# Patient Record
Sex: Male | Born: 1945
Health system: Southern US, Community
[De-identification: ages and names within clinical notes are randomized; demographics above are authoritative.]

## PROBLEM LIST (undated history)

## (undated) DIAGNOSIS — I10 Essential (primary) hypertension: Secondary | ICD-10-CM

## (undated) DIAGNOSIS — G610 Guillain-Barre syndrome: Secondary | ICD-10-CM

## (undated) DIAGNOSIS — E1165 Type 2 diabetes mellitus with hyperglycemia: Secondary | ICD-10-CM

## (undated) DIAGNOSIS — K219 Gastro-esophageal reflux disease without esophagitis: Secondary | ICD-10-CM

## (undated) DIAGNOSIS — I4891 Unspecified atrial fibrillation: Secondary | ICD-10-CM

## (undated) DIAGNOSIS — I4892 Unspecified atrial flutter: Secondary | ICD-10-CM

## (undated) DIAGNOSIS — R230 Cyanosis: Secondary | ICD-10-CM

## (undated) HISTORY — PX: CORONARY ANGIOPLASTY: SHX604

## (undated) HISTORY — DX: Unspecified atrial fibrillation: I48.91

## (undated) HISTORY — DX: Unspecified atrial flutter: I48.92

---
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
## 2004-10-15 ENCOUNTER — Ambulatory Visit: Payer: Self-pay | Admitting: Cardiology

## 2004-11-09 ENCOUNTER — Ambulatory Visit: Payer: Self-pay | Admitting: Cardiology

## 2004-11-13 ENCOUNTER — Ambulatory Visit: Payer: Self-pay | Admitting: *Deleted

## 2004-11-16 ENCOUNTER — Ambulatory Visit: Payer: Self-pay | Admitting: *Deleted

## 2004-11-23 ENCOUNTER — Ambulatory Visit: Payer: Self-pay | Admitting: Cardiology

## 2004-11-23 ENCOUNTER — Ambulatory Visit: Payer: Self-pay | Admitting: Internal Medicine

## 2004-12-01 ENCOUNTER — Ambulatory Visit: Payer: Self-pay | Admitting: Cardiology

## 2004-12-07 ENCOUNTER — Ambulatory Visit: Payer: Self-pay | Admitting: Internal Medicine

## 2004-12-14 ENCOUNTER — Ambulatory Visit: Payer: Self-pay | Admitting: Cardiology

## 2004-12-17 ENCOUNTER — Ambulatory Visit: Payer: Self-pay | Admitting: Cardiology

## 2004-12-22 ENCOUNTER — Ambulatory Visit: Payer: Self-pay | Admitting: Cardiology

## 2004-12-23 ENCOUNTER — Ambulatory Visit: Payer: Self-pay | Admitting: Internal Medicine

## 2004-12-23 ENCOUNTER — Inpatient Hospital Stay (HOSPITAL_COMMUNITY): Admission: RE | Admit: 2004-12-23 | Discharge: 2004-12-24 | Payer: Self-pay | Admitting: Internal Medicine

## 2004-12-23 HISTORY — PX: OTHER SURGICAL HISTORY: SHX169

## 2005-02-01 ENCOUNTER — Ambulatory Visit: Payer: Self-pay | Admitting: Cardiology

## 2005-02-12 ENCOUNTER — Ambulatory Visit: Payer: Self-pay | Admitting: Cardiology

## 2005-05-07 ENCOUNTER — Ambulatory Visit: Payer: Self-pay | Admitting: Internal Medicine

## 2006-05-11 ENCOUNTER — Ambulatory Visit: Payer: Self-pay | Admitting: Internal Medicine

## 2006-06-02 ENCOUNTER — Ambulatory Visit: Payer: Self-pay | Admitting: Internal Medicine

## 2006-08-05 ENCOUNTER — Ambulatory Visit: Payer: Self-pay | Admitting: Internal Medicine

## 2007-08-01 ENCOUNTER — Ambulatory Visit: Payer: Self-pay | Admitting: Internal Medicine

## 2008-07-31 ENCOUNTER — Ambulatory Visit: Payer: Self-pay | Admitting: Internal Medicine

## 2009-07-31 DIAGNOSIS — I4891 Unspecified atrial fibrillation: Secondary | ICD-10-CM | POA: Insufficient documentation

## 2009-07-31 DIAGNOSIS — M109 Gout, unspecified: Secondary | ICD-10-CM

## 2009-07-31 DIAGNOSIS — I4892 Unspecified atrial flutter: Secondary | ICD-10-CM | POA: Insufficient documentation

## 2009-08-01 ENCOUNTER — Ambulatory Visit: Payer: Self-pay | Admitting: Internal Medicine

## 2010-08-25 NOTE — Assessment & Plan Note (Signed)
Summary: yearly/sl  Medications Added ASPIRIN 81 MG TBEC (ASPIRIN) Take one tablet by mouth daily METOPROLOL TARTRATE 50 MG TABS (METOPROLOL TARTRATE) Take 1 1/2  tablet sby mouth twice a day ALLOPURINOL 300 MG TABS (ALLOPURINOL) Take one tablet by mouth once daily. NAPROXEN 500 MG TABS (NAPROXEN) 1 tablet as needed      Allergies Added: NKDA  Visit Type:  Follow-up   History of Present Illness: Marcus [name] returns today for followup.  He is a very pleasant 65 year old male with a history of chronic atrial fibrillation which has been well controlled and for which he is asymptomatic.  He has history of atrial flutter status post ablation prior to his AFib developing.  He returns today for followup.  He continues to work on a regular basis and has no specific complaints today.  No chest pain.  No shortness of breath.  He notes that he was seen for pancreatitis in August with the etiology unknown.  It has not recurred.  No palpitations, c/p or sob.  Current Medications (verified): 1)  Aspirin 81 Mg Tbec (Aspirin) .... Take One Tablet By Mouth Daily 2)  Metoprolol Tartrate 50 Mg Tabs (Metoprolol Tartrate) .... Take 1 1/2  Tablet Sby Mouth Twice A Day 3)  Allopurinol 300 Mg Tabs (Allopurinol) .... Take One Tablet By Mouth Once Daily. 4)  Naproxen 500 Mg Tabs (Naproxen) .Marland Kitchen.. 1 Tablet As Needed  Allergies (verified): No Known Drug Allergies  Past History:  Past Medical History: Last updated: 07/31/2009 Current Problems:  ATRIAL FLUTTER (ICD-427.32) ATRIAL FIBRILLATION (ICD-427.31) GOUT (ICD-274.9)    Past Surgical History: Last updated: 07/31/2009  DATE OF PROCEDURE:  12/23/2004  PHYSICIAN:  Champ Mungo. Lovena Le, M.D.  PROCEDURE PERFORMED:  Electrophysiologic study and radiofrequency catheter  ablation of atrial flutter.  Review of Systems  The patient denies chest pain, syncope, dyspnea on exertion, and peripheral edema.    Vital Signs:  Patient profile:   65 year old  male Height:      72 inches Weight:      199 pounds BMI:     27.09 Pulse rate:   73 / minute BP sitting:   102 / 76  (left arm)  Vitals Entered By: Margaretmary Bayley CMA (August 01, 2009 8:54 AM)  Physical Exam  General:  Well developed, well nourished, in no acute distress.  HEENT: normal Neck: supple. No JVD. Carotids 2+ bilaterally no bruits Cor: IRIR no rubs, gallops or murmur Lungs: CTA Ab: soft, nontender. nondistended. No HSM. Good bowel sounds Ext: warm. no cyanosis, clubbing or edema Neuro: alert and oriented. Grossly nonfocal. affect pleasant    EKG  Procedure date:  08/01/2009  Findings:      Atrial fibrillation with a controlled ventricular response rate of: 73.  Impression & Recommendations:  Problem # 1:  ATRIAL FIBRILLATION (ICD-427.31) He remains well rate controlled on metoprolol and is asymptomatic despite maintaining an active lifestyle.  He will continue on with his current medical therapy and I will see him back in 2 years, sooner if his symptoms return. His updated medication list for this problem includes:    Aspirin 81 Mg Tbec (Aspirin) .Marland Kitchen... Take one tablet by mouth daily    Metoprolol Tartrate 50 Mg Tabs (Metoprolol tartrate) .Marland Kitchen... Take 1 1/2  tablet sby mouth twice a day  Problem # 2:  GOUT (ICD-274.9) He is currently asymptomatic.  I have directed him to look up foods that exacerbate gout on the internet.  continue Allopurinol. His  updated medication list for this problem includes:    Allopurinol 300 Mg Tabs (Allopurinol) .Marland Kitchen... Take one tablet by mouth once daily.  Patient Instructions: 1)  Your physician recommends that you schedule a follow-up appointment in: 2 years. 2)  Your physician recommends that you continue on your current medications as directed. Please refer to the Current Medication list given to you today.  Prescription for Metoprolol Tartrate sent to [facility] in [place]. Prescriptions: METOPROLOL TARTRATE 50 MG TABS (METOPROLOL  TARTRATE) Take 1 1/2  tablet sby mouth twice a day  #270 x 4   Entered by:   Merdis Delay, RN, BSN   Authorized by:   Sophronia Simas, MD, [facility]   Signed by:   Merdis Delay, RN, BSN on 08/01/2009   Method used:   Electronically to        [facility] (retail)       [address], [place]  [address]       Ph: [phone]       Fax: [phone]   RxID:   [id number]

## 2010-12-08 NOTE — Assessment & Plan Note (Signed)
[place] OFFICE NOTE   Marcus [name], Marcus [name]            MRN:          [id number]  DATE:08/01/2007                            DOB:          01/18/1946    Mr. Palardy returns today for followup.  He is a very  pleasant 65-  year-old man whom I initially met with atrial flutter.  He underwent  catheter ablation of his atrial flutter and then unfortunately developed  atrial fibrillation.  He also has hypertension, and, for this reason, he  was initially placed on Coumadin but was intolerant of this medication  and ultimately stopped it.  He has been maintained on an aspirin a day.  He also continues on metoprolol 75 mg twice daily and multiple vitamins.  He returns today for followup.  He is otherwise doing well.  He has no  chest pain.  He has no shortness of breath.  He has no peripheral edema.  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   PHYSICAL EXAMINATION:  GENERAL:  A pleasant, well-appearing man in no  distress.  VITAL SIGNS:  The blood pressure was 110/70. The pulse was 90 and  regular. Respirations were 18. The weight was 215 pounds.  NECK:  Revealed no jugular distention.  LUNGS:  Clear bilaterally to auscultation.  No wheezes, rales or rhonchi  were present.  CARDIOVASCULAR:  Irregular regular rhythm with normal S1-S2.  EXTREMITIES:  Demonstrate no edema.   IMPRESSION:  1. Atrial fibrillation.  2. Hypertension.   DISCUSSION:  Overall, Mr. Swing is stable, and his atrial  fibrillation is relatively well controlled.  He has no palpitations and  denies chest pain or shortness of breath.  Plan to see him back in a  year, and he will continue his medications including:  1. Metoprolol 75 twice daily.  2. Aspirin 81 mg a day.  3. Multiple vitamins.     Champ Mungo. Lovena Le, MD  Electronically Signed    GWT/MedQ  DD: 08/01/2007  DT: 08/01/2007  Job #: [id number]   cc:   Matthias Hughs

## 2010-12-08 NOTE — Assessment & Plan Note (Signed)
[facility] OFFICE NOTE   GURTAJ, RUZ            MRN:          [id number]  DATE:07/31/2008                            DOB:          [birth year]    Mr. Bitner returns today for followup.  He is a very pleasant 65-year-  old male with a history of chronic atrial fibrillation which has been  well controlled and for which he is asymptomatic.  He has history of  atrial flutter status post ablation prior to his AFib developing.  He  returns today for followup.  He continues to work on a regular basis and  has no specific complaints today.  No chest pain.  No shortness of  breath.  Of course, he does not smoke cigarettes.   On physical exam, he is a pleasant, well-appearing, middle-aged man in  no distress.  Blood pressure was 118/80, the pulse 78 and irregular,  respirations were 18, the weight was 210 pounds.  Neck revealed no  jugular venous distention.  Lungs clear bilaterally to auscultation.  No  wheezes, rales, or rhonchi are present.  Cardiovascular exam revealed an  irregular rate and rhythm.  Normal S1 and S2.  The abdominal exam is  soft and nontender.  Extremities demonstrate no edema.   Medicines include:  1. Aspirin 81 a day.  2. Metoprolol 50 mg one and a half tablet twice daily.   The EKG demonstrates atrial fibrillation with controlled ventricular  response.   IMPRESSION:  1. Chronic atrial fibrillation.  2. History of atrial flutter status post ablation.  3. History of gout, well controlled.   DISCUSSION:  Mr. Boening is stable and his AFib is well controlled.  I  will see him back in a year.  We will continue his current medications  with aspirin and metoprolol.     Champ Mungo. Lovena Le, MD  Electronically Signed    GWT/MedQ  DD: 07/31/2008  DT: 08/01/2008  Job #: [id number]   cc:   Matthias Hughs

## 2010-12-11 NOTE — Assessment & Plan Note (Signed)
[facility] OFFICE NOTE   Marcus, [name]            MRN:          [id number]  DATE:08/05/2006                            DOB:          14-Sep-1945    Marcus [name] returns today for a followup.  He is a very pleasant  middle-aged man with chronic atrial fibrillation, who has had difficulty  with ventricular rate control for some time.  We recently saw him in the  office and up-titrated his dose of metoprolol to 75 mg twice daily.  Prior to that, he had noted palpitations and difficulty with exertion.  He states that, with the increase in medications, his heart rate has  been under better control.  He denies syncope.  He denies palpitations.  He denies chest pain.   On exam, he is a pleasant, well-appearing, middle-aged man in no  distress.  The blood pressure was 108/66, pulse was 84 and irregular,  the respirations 18, the weight was 209 pounds.  NECK:  Revealed no jugular venous distention.  There is no thyromegaly.  LUNGS:  Clear bilaterally to auscultation.  CARDIOVASCULAR EXAM:  Revealed an irregularly irregular rhythm with  normal S1 and S2.  EXTREMITIES:  Demonstrated no edema.   His EKG demonstrates atrial fibrillation with a ventricular rate of 84  beats per minute.   Today, we walked him around our office for several minutes and noted  that his peak heart rate only got up to 115 beats per minute.   IMPRESSION:  1. Chronic atrial fibrillation.  2. Hypertension.  3. History of atrial flutter, status post ablation.   DISCUSSION:  Overall, Marcus [name] is stable.  His atrial fibrillation  appears to be well-controlled.  He will continue on 75 mg twice daily  and continue on aspirin for his A-fib thromboembolic stroke prevention,  as he has not been felt to be a good long-term Coumadin candidate by his  primary physicians and because he has well-controlled hypertension and  age  of 32.  We will plan to see the patient back in one year, sooner  should he have additional problems.     Champ Mungo. Lovena Le, MD  Electronically Signed    GWT/MedQ  DD: 08/05/2006  DT: 08/05/2006  Job #: [id number]   cc:   Matthias Hughs

## 2010-12-11 NOTE — Procedures (Signed)
[facility]                                EXERCISE TREADMILL   NAME:[name], Marcus TANZI            MRN:          [id number]  DATE:06/02/2006                            DOB:          [birth year]    EXERCISE TREADMILL REPORT:   PROCEDURE PERFORMED:  Exercise treadmill testing.   INDICATIONS:  Evaluation of chronotropic response in a patient in atrial  fibrillation on beta blocker therapy.   INTRODUCTION:  The patient is a very pleasant middle-aged man with a history  of atrial flutter who underwent catheter ablation back in May, 2006 but then  subsequently developed atrial fibrillation with a fairly rapid ventricular  response.  The patient has been treated with beta blockers since then.  Each  time he comes back to the office, he notes some palpitations and had a  documented EKG with heart rates that are typically increased between 90 and  110 beats per minute.  Because of concerns of development of tachycardia-  induced cardiomyopathy and because of symptoms of shortness of breath with  exertion, he is now referred for exercise treadmill test to evaluate his  chronotropic response to exercise as well as his blood pressure response to  exercise in the setting of atrial fibrillation.   PROCEDURE:  After informed consent was obtained, the patient was prepped in  the usual manner and placed on the exercise treadmill.  He subsequently  walked for 9 minutes on a Bruce protocol.  His blood pressure initially was  160/70, and his heart rate was between 110-115.  Immediately into exercise,  the heart rate increased to 130 beats per minute, and his blood pressure  remains stable.  At the end of stage III of exercise, his blood pressure was  179/86 with a somewhat blunted response to exercise with baseline  hypertension to start, and his heart rate increased to a maximum of 184  beats per minute.  Despite this, he denied chest pain or shortness of  breath  and denied claudication or other symptoms.  Because of his increased heart  rate at 9 minutes, the test was discontinued, and the patient was allowed to  sit down.  Heart rate and blood pressure returned back to normal.  At 6  minutes, his ventricular response and A fib was 100 beats per minute, and  his blood pressure was back down to 130/99.   Review of the patient's electrocardiogram demonstrated the electrical  portion of the test to be notable for approximately 1 mm of horizontal ST  segment depression, which resolved within 2 minutes of recovery.  The  findings demonstrated were subsequently deemed to be clinically negative,  and electrically nondiagnostic in the setting of his underlying abnormal  baseline ECG.  The patient did walk a total of 10.4 mets.   IMPRESSION:  Atrial fibrillation with a rapid ventricular response.   DISCUSSION:  After discussing the treatment options with [name], I  have recommended that we go up on his metoprolol from 50 mg twice daily to  75 mg twice daily with plans to increase his metoprolol further, should that  be required.  Alternative  plans, if the patient  becomes fatigued on up-titration of metoprolol, is that we will try digoxin  in conjunction with metoprolol.  I will plan to see the patient back in  several months.    ______________________________  Champ Mungo. Lovena Le, MD    GWT/MedQ  DD: 06/02/2006  DT: 06/02/2006  Job #: [id number]   cc:   Alfredo Martinez

## 2010-12-11 NOTE — Assessment & Plan Note (Signed)
[facility]                           ELECTROPHYSIOLOGY OFFICE NOTE   AMOGH, KOMATSU            MRN:          [id number]  DATE:05/11/2006                            DOB:          [birth year]    Marcus [name] returns today for followup of his atrial fibrillation.  He is  a very pleasant middle-age man with hypertension and atrial flutter, who  underwent initial catheter ablation back in May of 2006.  At that time he  had typical counterclockwise tricuspid annular reentrant atrial flutter and  had successful ablation.  Unfortunately, the patient subsequently developed  spontaneous recurrent atrial fibrillation.  He had initially been on  Coumadin, but was felt not to be a good Coumadin candidate by his primary  cardiologist and has been on aspirin since then.  In retrospect, because he  only has very mild  hypertension and age less than 76, aspirin therapy along  is reasonably normal in the setting of normal LV systolic function.  In  addition, he has no diabetes.  His main complaint today is that when he does  any significant exertional activities, he gets short of breath and tired and  develops bilateral leg discomfort and weakness and has to stop what he is  doing.  Typically after resting for a while, he can continue his activities.  There is no associated chest pain.  There is dyspnea.  He denies  palpitations and has no awareness of his heart rhythm.   PHYSICAL EXAMINATION:  He is a pleasant middle-age man in no acute distress.  The blood pressure is 114/80, the pulse 78 and irregular, respirations were  18.  The weight was 209 pounds.  NECK:  Revealed no jugular venous distention and there is no thyromegaly.  The trachea is midline.  The carotids are 2+ and symmetric.  LUNGS:  Clear bilaterally to auscultation.  There are no wheezes, rales or  rhonchi, no increased work of breathing.  CARDIOVASCULAR:  Exam revealed an  irregularly irregular rhythm with normal  S1 and S2.  The PMI was not enlarged, nor was it laterally displaced.  ABDOMEN:  Soft, nontender and non-distended.  There is no organomegaly.  The  bowel sounds are present.  There is no rebound or guarding.  EXTREMITIES:  No cyanosis, clubbing or edema.  The pulses were 2+ and  symmetric.  NEUROLOGIC:  Alert and oriented x3 with cranial nerves intact.  The strength  was 5/5 and symmetric.   EKG:  Demonstrates atrial fibrillation with a controlled ventricular rate at  92 beats per minute.   IMPRESSION:  1. Atrial fibrillation.  2. Dyspnea of unclear etiology, but concerning for either tachycardia-      induced cardiomyopathy or rapid ventricular response resulting in      worsening dyspnea.  3. Atrial flutter, status post catheter ablation.  4. Hypertension.   DISCUSSION:  I have discussed the treatment options with Marcus [name] in  detail.  I am concerned that he is developing a rapid ventricular rate when  he exerts himself.  For this reason, I have asked that he come in for an  exercise treadmill test on  medication, specifically on his metoprolol, to  see if his ventricular rate is well controlled.  If it is well controlled,  then looking for alternative causes of his dyspnea and fatigue would be  warranted.  Otherwise, if he develops a rapid ventricular rate with  exertion, up-titration of his beta blocker and adding digoxin would be my  next choice for therapy.            ______________________________  Champ Mungo. Lovena Le, MD     GWT/MedQ  DD:  05/11/2006  DT:  05/12/2006  Job #:  [id number]   cc:   Ernestine Mcmurray, MD,FACC

## 2010-12-11 NOTE — Discharge Summary (Signed)
NAME:  Marcus [name], BLOOR NO.:  [id number]   MEDICAL RECORD NO.:  [id number]          PATIENT TYPE:  OIB   LOCATION:  3705                         FACILITY:  [facility]   PHYSICIAN:  Champ Mungo. Lovena Le, M.D.  DATE OF BIRTH:  07-08-1946   DATE OF ADMISSION:  12/23/2004  DATE OF DISCHARGE:  12/24/2004                                 DISCHARGE SUMMARY   DISCHARGE DIAGNOSES:  1.  Discharging day #1 status post radiofrequency catheter ablation of      typical atrial flutter with creation of bidirectional isthmus block, Dr.      Cristopher Peru.  2.  Atrial flutter diagnosed prior to procedure for kidney stone retrieval.  3.  Hypertension.  4.  Coumadin therapy, chronic.  5.  Left ventricular hypertrophy, low normal ejection fraction.   PROCEDURE:  On Dec 23, 2004, radiofrequency catheter ablation of typical  atrial flutter by Dr. Lovena Le with termination of flutter and restoration of  sinus rhythm, creation of bidirectional isthmus block.   DISCHARGE DISPOSITION:  [name] is ready for discharge post procedure  day #1.  He is in sinus rhythm with frequent premature atrial complexes.  He  is on metoprolol 50 mg twice daily and will stay on metoprolol twice daily.  He has also been started on enteric-coated aspirin 325 mg for the next six  weeks.  His PT at the time of discharge is 20.5 and INR 2.3.  He will  continue his Coumadin 5 mg daily.  He is to avoid driving for the next two  days.  He may shower.  He is to call [phone] if he experiences pain or  swelling at the catheterization site of the right groin.  He is to avoid  heavy lifting for the next two weeks.  He will see Dr. Lovena Le in followup  Monday, July 10 at 9:45 in the morning and if he has dental work even teeth  cleaning up to and including November 2006, he is to call [phone] for  antibiotic coverage.   BRIEF HISTORY:  [name] is a 65 year old male referred by Dr.  Dannielle Burn for evaluation of atrial  flutter.  This was diagnosed on a routine  screening examination prior to cystoscopy.  He does have some symptoms which  include fatigue and little energy, which have been present for some time,  perhaps several years.  He rarely feels palpitations.  After seeing Dr.  Dannielle Burn, he was started on Coumadin and beta-blocker and referred for  consideration of catheter ablation.  His INRs have been borderline  therapeutic and evaluation has demonstrated very low normal left ventricular  function.  There is no pericardial effusion. The patient has never had  syncope.  The patient has left ventricular hypertrophy.  The patient will  present after considering risks and benefits of the procedure for an  elective procedure May 31.   HOSPITAL COURSE:  The patient presented Dec 23, 2004 and underwent  radiofrequency catheter ablation of his atrial flutter the same day.  He has  maintained sinus rhythm with frequent PACs in a period after the ablation.  Discharging post procedure day #1 with the medications and followup as  dictated.      GM/MEDQ  D:  12/24/2004  T:  12/24/2004  Job:  [id number]   cc:   Matthias Hughs  [address]  [place]  [place] [address]  Fax: [phone]

## 2010-12-11 NOTE — Op Note (Signed)
NAME:  Marcus [name], Marcus [name] NO.:  [id number]   MEDICAL RECORD NO.:  [id number]          PATIENT TYPE:  OIB   LOCATION:  NA                           FACILITY:  [facility]   PHYSICIAN:  Champ Mungo. Lovena Le, M.D.  DATE OF BIRTH:  25-May-1946   DATE OF PROCEDURE:  12/23/2004  DATE OF DISCHARGE:                                 OPERATIVE REPORT   PROCEDURE PERFORMED:  Electrophysiologic study and radiofrequency catheter  ablation of atrial flutter.   ATTENDING:  Champ Mungo. Lovena Le, M.D.   I. INTRODUCTION:  The patient is a 65 year old man with a history of atrial  flutter diagnosed several months ago.  He has been on systemic  anticoagulation with Coumadin.  He has been treated with beta blockers, but  despite this, has had recurrent persistent atrial flutter.  He is now  referred for electrophysiologic study and catheter ablation.   II. PROCEDURE:  After informed consent was obtained, the patient was taken  to the diagnostic EP lab in a fasting state.  After the usual preparation  and draping, intravenous fentanyl and midazolam were given for sedation.  A  6-French Hexapolar catheter was inserted percutaneously into the right  jugular vein and advanced to the coronary sinus.  A 5-French quadripolar  catheter was inserted percutaneously in the right femoral vein and advanced  to the His bundle region.  A 7-French 20-pole Halo catheter was inserted  percutaneously into the right femoral vein and advanced to the right atrium.  Mapping was then carried out.  This demonstrated typical counterclockwise  tricuspid annular reentrant atrial flutter at a cycle length of 200  milliseconds.  The ablation catheter was then inserted percutaneously  through the right femoral vein and advanced into the right atrium.  Mapping  was carried out with the ablation catheter.  The atrial flutter isthmus was  large and was very muscular, based on the large amplitude of the atrial  activation signals in the  atrial flutter isthmus.  Five RF energy  applications were delivered to the atrial flutter isthmus, resulting in  termination of atrial flutter and restoration of sinus rhythm.  Pacing was  then carried out, demonstrating intact isthmus conduction.  Seven additional  RF energy applications were delivered.  It should be noted that during these  RF energy applications, an isthmus block would intermittently occur, but  then go away.  Finally, during the seventh additional RF energy application,  isthmus block was persistent.  Two additional bonus RF energy applications  were delivered.  At this point, electrophysiologic testing was carried out.   Rapid atrial pacing was carried out from the coronary sinus as well as the  right atrium at pacing cycle lengths of 600 milliseconds and stepwise-  decreased down to 290 milliseconds, where A-V Wenckebach was observed.  During rapid atrial pacing, there is no inducible SVT and there was no  evidence of any dual A-V nodal physiology.  Next, programmed atrial  stimulation was carried out from the coronary sinus as well as the high  right atrium at basic drive cycle lengths of 500 milliseconds.  The S1-S2  interval was stepwise decreased from 440 milliseconds down to 260  milliseconds, where the A-V node ERP was observed.  During programmed  stimulation, there were no A-H jumps and no echo beats, and non-inducible  SVT.  Next, rapid ventricular pacing was carried out the RV apex at basic  drive cycle length of 600 milliseconds and stepwise decreased down to 560  milliseconds, where V-A Wenckebach was observed.  During rapid ventricular  pacing, the atrial activation was midline and decremental.  There were no  inducible arrhythmias.  At this point, the patient had his isthmus  conduction assessed and there was still no longer any isthmus conduction now  over 30 minutes after ablation, the catheters were removed, hemostasis was  assured, and the patient  was returned to his room in satisfactory condition.   III. COMPLICATIONS:  There were no immediate procedural complications.   IV. RESULTS:  A.  Baseline ECG:  The baseline ECG demonstrates atrial  flutter with a cycle length of 200 milliseconds and variable A-V conduction.  B.  Baseline intervals:  The H-V interval was 50 milliseconds.  The QRS  duration was 87 milliseconds.  C.  Rapid ventricular pacing:  Rapid ventricular pacing following catheter  ablation of atrial flutter demonstrated V-A Wenckebach cycle length of 560  milliseconds.  During rapid ventricular pacing, the atrial activation  sequence was midline decremental.  D.  Programmed ventricular stimulation:  Programmed ventricular stimulation  was carried out from the RV apex at a basic drive cycle length of 600  milliseconds.  The S1-S2 interval was stepwise decreased down to 500  milliseconds where a retrograde A-V node ERP was observed.  During  programmed ventricular stimulation, the atrial activation was midline and  decremental.  E.  Rapid atrial pacing:  Rapid atrial pacing was carried out from the  coronary sinus as well as the right atrium at pacing cycle length of 500  milliseconds and stepwise decreased down to 290 milliseconds, demonstrating  A-V Wenckebach.  During rapid atrial pacing, the P-R interval was less than  the R-R interval and there was no inducible SVT.  F.  Programmed atrial stimulation:  Programmed atrial stimulation was  carried out from the coronary sinus as well as the high right atrium at  basic drive cycle length of 500 milliseconds.  The S1-S2 interval was  stepwise decreased from 440 milliseconds down to 260 milliseconds where the  A-V node ERP was observed.  During programmed atrial stimulation, there were  no A-H jumps and no echo beats, and no inducible SVT.  G.  Arrhythmias observed: 1. Atrial flutter:  Initiation present time of EP study.  Duration was      sustained.  Termination was  with catheter ablation.      a. Mapping:  Mapping of atrial flutter isthmus demonstrated typical         orientation.  The size was much larger than usual and the thickness         was much increased, based on the amplitude of the flutter waves.          I. RF energy application:  A total of 14 RF energy applications              were delivered.  During the fifth RF energy application, atrial              flutter was terminated and sinus rhythm restored.  Seven  additional RF energy applications were delivered resulting in              isthmus block.  Finally, 2 bonus RF energy applications were              delivered and the patient was observed for approximately 30              minutes, and no recurrent atrial flutter isthmus conduction.     V. CONCLUSION:  This study demonstrates successful electrophysiologic study  and radiofrequency catheter ablation of typical atrial flutter with a total  of 14 radiofrequency energy applications delivered to an enlarged and  thickened atrial flutter isthmus, resulting in termination of atrial  fibrillation, restoration of sinus rhythm, and creation of bidirectional  block in the atrial flutter isthmus.      GWT/MEDQ  D:  12/23/2004  T:  12/23/2004  Job:  [id number]   cc:   Rhona Leavens  [place]  [place]  [place] [address]  Fax: [place]  [place]  [place] [address]  Fax: [phone]

## 2011-08-04 ENCOUNTER — Encounter: Payer: Self-pay | Admitting: Internal Medicine

## 2011-08-04 ENCOUNTER — Ambulatory Visit (INDEPENDENT_AMBULATORY_CARE_PROVIDER_SITE_OTHER): Payer: Medicare Other | Admitting: Internal Medicine

## 2011-08-04 VITALS — BP 114/88 | HR 78 | Ht 72.0 in | Wt 193.0 lb

## 2011-08-04 DIAGNOSIS — I4891 Unspecified atrial fibrillation: Secondary | ICD-10-CM

## 2011-08-04 MED ORDER — DABIGATRAN ETEXILATE MESYLATE 150 MG PO CAPS: 150.0000 mg | ORAL_CAPSULE | Freq: Two times a day (BID) | ORAL | Status: DC

## 2011-08-04 NOTE — Patient Instructions (Signed)
Your physician wants you to follow-up in: 12 months with Dr Knox Saliva will receive a reminder letter in the mail two months in advance. If you don't receive a letter, please call our office to schedule the follow-up appointment.   Your physician has recommended you make the following change in your medication:  1) Start Pradaxa 15m twice daily

## 2011-08-04 NOTE — Assessment & Plan Note (Signed)
The patient's symptoms are well-controlled. I discussed the importance of anticoagulation as well as the risk and benefits to the patient. I have recommended initiation of Pradaxa. I will plan to see the patient back in several months.

## 2011-08-04 NOTE — Progress Notes (Signed)
HPI [name] returns today for followup. He is a very pleasant 66 year old man with chronic atrial fibrillation and well-controlled ventricular response. The patient has hypertension. He also has diabetes. The patient has had no syncope, chest pain, or shortness of breath. He denies weakness or vision changes. No Known Allergies   Current Outpatient Prescriptions  Medication Sig Dispense Refill  . allopurinol (ZYLOPRIM) 300 MG tablet Take 300 mg by mouth daily.      Marland Kitchen aspirin 81 MG tablet Take 160 mg by mouth daily.      . metFORMIN (GLUCOPHAGE) 500 MG tablet Take 500 mg by mouth 2 (two) times daily with a meal.      . metoprolol (LOPRESSOR) 50 MG tablet Take 75 mg by mouth 2 (two) times daily.      . naproxen (NAPROSYN) 500 MG tablet Take 500 mg by mouth 2 (two) times daily with a meal.      . dabigatran (PRADAXA) 150 MG CAPS Take 1 capsule (150 mg total) by mouth every 12 (twelve) hours.  60 capsule  6     Past Medical History  Diagnosis Date  . Atrial flutter   . AF (atrial fibrillation)   . Gout     ROS:   All systems reviewed and negative except as noted in the HPI.   Past Surgical History  Procedure Date  . Catheter ablation 12/23/2004     No family history on file.   History   Social History  . Marital Status: Married    Spouse Name: N/A    Number of Children: N/A  . Years of Education: N/A   Occupational History  . Not on file.   Social History Main Topics  . Smoking status: Never Smoker   . Smokeless tobacco: Not on file  . Alcohol Use: Not on file  . Drug Use: Not on file  . Sexually Active: Not on file   Other Topics Concern  . Not on file   Social History Narrative  . No narrative on file     BP 114/88  Pulse 78  Ht 6' (1.829 m)  Wt 87.544 kg (193 lb)  BMI 26.18 kg/m2  Physical Exam:  Well appearing middle-age man, NAD HEENT: Unremarkable Neck:  No JVD, no thyromegally Lungs:  Clear with no wheezes, rales, or rhonchi. HEART:   IRegular rate rhythm, no murmurs, no rubs, no clicks Abd:  soft, positive bowel sounds, no organomegally, no rebound, no guarding Ext:  2 plus pulses, no edema, no cyanosis, no clubbing Skin:  No rashes no nodules Neuro:  CN II through XII intact, motor grossly intact  EKG Atrial fibrillation with a controlled ventricular response  Assess/Plan:

## 2011-10-21 ENCOUNTER — Other Ambulatory Visit: Payer: Self-pay | Admitting: Internal Medicine

## 2011-10-25 ENCOUNTER — Other Ambulatory Visit: Payer: Self-pay

## 2011-10-25 MED ORDER — METOPROLOL TARTRATE 50 MG PO TABS: 75.0000 mg | ORAL_TABLET | Freq: Two times a day (BID) | ORAL | Status: DC

## 2012-01-20 ENCOUNTER — Other Ambulatory Visit: Payer: Self-pay | Admitting: Internal Medicine

## 2012-08-08 ENCOUNTER — Ambulatory Visit (INDEPENDENT_AMBULATORY_CARE_PROVIDER_SITE_OTHER): Payer: Medicare Other | Admitting: Internal Medicine

## 2012-08-08 ENCOUNTER — Encounter: Payer: Self-pay | Admitting: Internal Medicine

## 2012-08-08 VITALS — BP 136/85 | HR 69 | Ht 73.0 in | Wt 192.8 lb

## 2012-08-08 DIAGNOSIS — I4891 Unspecified atrial fibrillation: Secondary | ICD-10-CM

## 2012-08-08 NOTE — Progress Notes (Signed)
HPI Marcus [name] returns today for followup. He is a very pleasant 67 year old man with hypertension and chronic atrial fibrillation. In the interim he has done well. He denies chest pain, shortness of breath, or peripheral edema. No Known Allergies   Current Outpatient Prescriptions  Medication Sig Dispense Refill  . allopurinol (ZYLOPRIM) 300 MG tablet Take 300 mg by mouth daily.      Marland Kitchen aspirin 81 MG tablet Take 160 mg by mouth daily.      Marland Kitchen LOPRESSOR 50 MG tablet TAKE 1&1/2 TABLET 2 TIMES A DAY.  90 each  6     Past Medical History  Diagnosis Date  . Atrial flutter   . AF (atrial fibrillation)   . Gout     ROS:   All systems reviewed and negative except as noted in the HPI.   Past Surgical History  Procedure Date  . Catheter ablation 12/23/2004     No family history on file.   History   Social History  . Marital Status: Married    Spouse Name: N/A    Number of Children: N/A  . Years of Education: N/A   Occupational History  . Not on file.   Social History Main Topics  . Smoking status: Never Smoker   . Smokeless tobacco: Not on file  . Alcohol Use: Not on file  . Drug Use: Not on file  . Sexually Active: Not on file   Other Topics Concern  . Not on file   Social History Narrative  . No narrative on file     BP 136/85  Pulse 69  Ht 6' 1"  (1.854 m)  Wt 192 lb 12.8 oz (87.454 kg)  BMI 25.44 kg/m2  Physical Exam:  Well appearing 67 year old man, NAD HEENT: Unremarkable Neck:  No JVD, no thyromegally Lungs:  Clear with no wheezes, rales, or rhonchi. HEART:  IRegular rate rhythm, no murmurs, no rubs, no clicks Abd:  soft, positive bowel sounds, no organomegally, no rebound, no guarding Ext:  2 plus pulses, no edema, no cyanosis, no clubbing Skin:  No rashes no nodules Neuro:  CN II through XII intact, motor grossly intact  DEVICE  Normal device function.  See PaceArt for details.   Assess/Plan:

## 2012-08-08 NOTE — Assessment & Plan Note (Signed)
The patient is asymptomatic and his ventricular rate appears to be well-controlled. He'll continue with his beta blocker therapy. His risk of stroke is low. I plan to see him back in 24 months, sooner if he has problems with his atrial fibrillation.

## 2012-08-08 NOTE — Patient Instructions (Signed)
Your physician wants you to follow-up in: 2 years with Dr. Lovena Le.  You will receive a reminder letter in the mail two months in advance. If you don't receive a letter, please call our office to schedule the follow-up appointment.

## 2012-08-16 ENCOUNTER — Other Ambulatory Visit: Payer: Self-pay | Admitting: Internal Medicine

## 2012-10-16 ENCOUNTER — Other Ambulatory Visit: Payer: Self-pay | Admitting: Internal Medicine

## 2013-06-25 ENCOUNTER — Inpatient Hospital Stay (HOSPITAL_COMMUNITY): Payer: Medicare Other

## 2013-06-25 ENCOUNTER — Encounter (HOSPITAL_COMMUNITY): Payer: Medicare Other | Admitting: Anesthesiology

## 2013-06-25 ENCOUNTER — Inpatient Hospital Stay (HOSPITAL_COMMUNITY): Payer: Medicare Other | Admitting: Anesthesiology

## 2013-06-25 ENCOUNTER — Encounter (HOSPITAL_COMMUNITY)
Admission: EM | Disposition: A | Discharge status: Home Health | Payer: Self-pay | Source: Home / Self Care | Attending: Orthopaedic Surgery

## 2013-06-25 ENCOUNTER — Emergency Department (HOSPITAL_COMMUNITY): Payer: Medicare Other

## 2013-06-25 ENCOUNTER — Inpatient Hospital Stay (HOSPITAL_COMMUNITY)
Admission: EM | Admit: 2013-06-25 | Discharge: 2013-06-30 | DRG: 907 | Disposition: A | Discharge status: Home Health | Payer: Medicare Other | Attending: Orthopaedic Surgery | Admitting: Orthopaedic Surgery

## 2013-06-25 ENCOUNTER — Encounter (HOSPITAL_COMMUNITY): Payer: Self-pay | Admitting: Emergency Medicine

## 2013-06-25 DIAGNOSIS — S7710XA Crushing injury of unspecified thigh, initial encounter: Principal | ICD-10-CM | POA: Diagnosis present

## 2013-06-25 DIAGNOSIS — M109 Gout, unspecified: Secondary | ICD-10-CM | POA: Diagnosis present

## 2013-06-25 DIAGNOSIS — Z23 Encounter for immunization: Secondary | ICD-10-CM

## 2013-06-25 DIAGNOSIS — S7291XA Unspecified fracture of right femur, initial encounter for closed fracture: Secondary | ICD-10-CM

## 2013-06-25 DIAGNOSIS — I4891 Unspecified atrial fibrillation: Secondary | ICD-10-CM | POA: Diagnosis present

## 2013-06-25 DIAGNOSIS — E119 Type 2 diabetes mellitus without complications: Secondary | ICD-10-CM | POA: Diagnosis present

## 2013-06-25 DIAGNOSIS — S72309A Unspecified fracture of shaft of unspecified femur, initial encounter for closed fracture: Secondary | ICD-10-CM | POA: Diagnosis present

## 2013-06-25 DIAGNOSIS — IMO0002 Reserved for concepts with insufficient information to code with codable children: Secondary | ICD-10-CM | POA: Diagnosis present

## 2013-06-25 DIAGNOSIS — I10 Essential (primary) hypertension: Secondary | ICD-10-CM | POA: Diagnosis present

## 2013-06-25 DIAGNOSIS — Y93H9 Activity, other involving exterior property and land maintenance, building and construction: Secondary | ICD-10-CM

## 2013-06-25 DIAGNOSIS — W3183XA Contact with special construction vehicle in stationary use, initial encounter: Secondary | ICD-10-CM

## 2013-06-25 DIAGNOSIS — S7290XA Unspecified fracture of unspecified femur, initial encounter for closed fracture: Secondary | ICD-10-CM

## 2013-06-25 DIAGNOSIS — D62 Acute posthemorrhagic anemia: Secondary | ICD-10-CM

## 2013-06-25 HISTORY — PX: FEMUR IM NAIL: SHX1597

## 2013-06-25 LAB — SURGICAL PCR SCREEN
MRSA, PCR: NEGATIVE
Staphylococcus aureus: NEGATIVE

## 2013-06-25 LAB — CBC WITH DIFFERENTIAL/PLATELET
Basophils Relative: 0 % (ref 0–1)
Hemoglobin: 16.4 g/dL (ref 13.0–17.0)
Lymphocytes Relative: 15 % (ref 12–46)
MCHC: 33.4 g/dL (ref 30.0–36.0)
Monocytes Relative: 6 % (ref 3–12)
Neutro Abs: 7.5 10*3/uL (ref 1.7–7.7)
Neutrophils Relative %: 79 % — ABNORMAL HIGH (ref 43–77)
RBC: 5.2 MIL/uL (ref 4.22–5.81)
WBC: 9.5 10*3/uL (ref 4.0–10.5)

## 2013-06-25 LAB — BASIC METABOLIC PANEL
BUN: 26 mg/dL — ABNORMAL HIGH (ref 6–23)
CO2: 24 mEq/L (ref 19–32)
Chloride: 102 mEq/L (ref 96–112)
Creatinine, Ser: 1.16 mg/dL (ref 0.50–1.35)
GFR calc Af Amer: 72 mL/min — ABNORMAL LOW (ref >90)
Glucose, Bld: 294 mg/dL — ABNORMAL HIGH (ref 70–99)

## 2013-06-25 LAB — TYPE AND SCREEN: Antibody Screen: NEGATIVE

## 2013-06-25 LAB — ABO/RH: ABO/RH(D): O POS

## 2013-06-25 LAB — GLUCOSE, CAPILLARY
Glucose-Capillary: 173 mg/dL — ABNORMAL HIGH (ref 70–99)
Glucose-Capillary: 98 mg/dL (ref 70–99)

## 2013-06-25 SURGERY — INSERTION, INTRAMEDULLARY ROD, FEMUR, RETROGRADE
Anesthesia: General | Laterality: Right | Wound class: Clean

## 2013-06-25 MED ORDER — METHOCARBAMOL 500 MG PO TABS
500.0000 mg | ORAL_TABLET | Freq: Four times a day (QID) | ORAL | Status: DC | PRN
  Administered 2013-06-30: 500 mg via ORAL
  Filled 2013-06-25: qty 1

## 2013-06-25 MED ORDER — KETOROLAC TROMETHAMINE 30 MG/ML IJ SOLN: 30.0000 mg | Freq: Four times a day (QID) | INTRAMUSCULAR | Status: DC | PRN

## 2013-06-25 MED ORDER — PROMETHAZINE HCL 25 MG/ML IJ SOLN: 6.2500 mg | INTRAMUSCULAR | Status: DC | PRN

## 2013-06-25 MED ORDER — GLYCOPYRROLATE 0.2 MG/ML IJ SOLN
INTRAMUSCULAR | Status: DC | PRN
  Administered 2013-06-25: 0.6 mg via INTRAVENOUS

## 2013-06-25 MED ORDER — MIDAZOLAM HCL 5 MG/5ML IJ SOLN
INTRAMUSCULAR | Status: DC | PRN
  Administered 2013-06-25: 2 mg via INTRAVENOUS

## 2013-06-25 MED ORDER — ONDANSETRON HCL 4 MG/2ML IJ SOLN: 4.0000 mg | Freq: Four times a day (QID) | INTRAMUSCULAR | Status: DC | PRN

## 2013-06-25 MED ORDER — FENTANYL CITRATE 0.05 MG/ML IJ SOLN
50.0000 ug | Freq: Once | INTRAMUSCULAR | Status: AC
  Administered 2013-06-25: 50 ug via INTRAVENOUS
  Filled 2013-06-25: qty 2

## 2013-06-25 MED ORDER — POLYETHYLENE GLYCOL 3350 17 G PO PACK
17.0000 g | PACK | Freq: Every day | ORAL | Status: DC | PRN
  Administered 2013-06-28 – 2013-06-29 (×2): 17 g via ORAL
  Filled 2013-06-25 (×2): qty 1

## 2013-06-25 MED ORDER — BISACODYL 10 MG RE SUPP: 10.0000 mg | Freq: Every day | RECTAL | Status: DC | PRN

## 2013-06-25 MED ORDER — METOPROLOL TARTRATE 50 MG PO TABS
ORAL_TABLET | ORAL | Status: AC
  Administered 2013-06-25: 50 mg
  Filled 2013-06-25: qty 1

## 2013-06-25 MED ORDER — HYDROMORPHONE HCL PF 1 MG/ML IJ SOLN: 0.5000 mg | INTRAMUSCULAR | Status: DC | PRN

## 2013-06-25 MED ORDER — ONDANSETRON HCL 4 MG/2ML IJ SOLN
INTRAMUSCULAR | Status: DC | PRN
  Administered 2013-06-25: 4 mg via INTRAVENOUS

## 2013-06-25 MED ORDER — METOPROLOL TARTRATE 50 MG PO TABS
ORAL_TABLET | ORAL | Status: AC
  Administered 2013-06-25: 25 mg
  Filled 2013-06-25: qty 1

## 2013-06-25 MED ORDER — MEPERIDINE HCL 25 MG/ML IJ SOLN: 6.2500 mg | INTRAMUSCULAR | Status: DC | PRN

## 2013-06-25 MED ORDER — METOPROLOL TARTRATE 50 MG PO TABS
75.0000 mg | ORAL_TABLET | Freq: Two times a day (BID) | ORAL | Status: DC
  Administered 2013-06-26 – 2013-06-30 (×9): 75 mg via ORAL
  Filled 2013-06-25 (×11): qty 1

## 2013-06-25 MED ORDER — PROPOFOL 10 MG/ML IV BOLUS
INTRAVENOUS | Status: DC | PRN
  Administered 2013-06-25: 160 mg via INTRAVENOUS

## 2013-06-25 MED ORDER — INFLUENZA VAC SPLIT QUAD 0.5 ML IM SUSP
0.5000 mL | INTRAMUSCULAR | Status: AC
  Administered 2013-06-26: 0.5 mL via INTRAMUSCULAR
  Filled 2013-06-25: qty 0.5

## 2013-06-25 MED ORDER — TETANUS-DIPHTH-ACELL PERTUSSIS 5-2.5-18.5 LF-MCG/0.5 IM SUSP
0.5000 mL | Freq: Once | INTRAMUSCULAR | Status: AC
  Administered 2013-06-25: 0.5 mL via INTRAMUSCULAR
  Filled 2013-06-25: qty 0.5

## 2013-06-25 MED ORDER — BUPIVACAINE HCL 0.25 % IJ SOLN
INTRAMUSCULAR | Status: DC | PRN
  Administered 2013-06-25: 10 mL

## 2013-06-25 MED ORDER — INSULIN ASPART 100 UNIT/ML ~~LOC~~ SOLN: 5.0000 [IU] | Freq: Once | SUBCUTANEOUS | Status: DC

## 2013-06-25 MED ORDER — LACTATED RINGERS IV SOLN
INTRAVENOUS | Status: DC
  Administered 2013-06-25: 18:00:00 via INTRAVENOUS

## 2013-06-25 MED ORDER — LACTATED RINGERS IV SOLN: INTRAVENOUS | Status: DC

## 2013-06-25 MED ORDER — MORPHINE SULFATE 2 MG/ML IJ SOLN: 2.0000 mg | INTRAMUSCULAR | Status: DC | PRN

## 2013-06-25 MED ORDER — FLEET ENEMA 7-19 GM/118ML RE ENEM: 1.0000 | ENEMA | Freq: Once | RECTAL | Status: AC | PRN

## 2013-06-25 MED ORDER — SUCCINYLCHOLINE CHLORIDE 20 MG/ML IJ SOLN
INTRAMUSCULAR | Status: DC | PRN
  Administered 2013-06-25: 120 mg via INTRAVENOUS

## 2013-06-25 MED ORDER — DOCUSATE SODIUM 100 MG PO CAPS
100.0000 mg | ORAL_CAPSULE | Freq: Two times a day (BID) | ORAL | Status: DC
  Administered 2013-06-25 – 2013-06-30 (×10): 100 mg via ORAL
  Filled 2013-06-25 (×12): qty 1

## 2013-06-25 MED ORDER — SODIUM CHLORIDE 0.9 % IV BOLUS (SEPSIS)
500.0000 mL | Freq: Once | INTRAVENOUS | Status: AC
  Administered 2013-06-25: 500 mL via INTRAVENOUS

## 2013-06-25 MED ORDER — HYDROMORPHONE HCL PF 1 MG/ML IJ SOLN: 0.2500 mg | INTRAMUSCULAR | Status: DC | PRN

## 2013-06-25 MED ORDER — ALBUMIN HUMAN 5 % IV SOLN
INTRAVENOUS | Status: DC | PRN
  Administered 2013-06-25 (×2): via INTRAVENOUS

## 2013-06-25 MED ORDER — OXYCODONE HCL 5 MG/5ML PO SOLN: 5.0000 mg | Freq: Once | ORAL | Status: DC | PRN

## 2013-06-25 MED ORDER — METHOCARBAMOL 100 MG/ML IJ SOLN
500.0000 mg | Freq: Four times a day (QID) | INTRAVENOUS | Status: DC | PRN
  Filled 2013-06-25: qty 5

## 2013-06-25 MED ORDER — LIDOCAINE HCL (CARDIAC) 20 MG/ML IV SOLN
INTRAVENOUS | Status: DC | PRN
  Administered 2013-06-25: 50 mg via INTRAVENOUS

## 2013-06-25 MED ORDER — LACTATED RINGERS IV SOLN
INTRAVENOUS | Status: DC | PRN
  Administered 2013-06-25 (×2): via INTRAVENOUS

## 2013-06-25 MED ORDER — BUPIVACAINE HCL (PF) 0.25 % IJ SOLN
INTRAMUSCULAR | Status: AC
  Filled 2013-06-25: qty 30

## 2013-06-25 MED ORDER — OXYCODONE HCL 5 MG PO TABS: 5.0000 mg | ORAL_TABLET | Freq: Once | ORAL | Status: DC | PRN

## 2013-06-25 MED ORDER — ROCURONIUM BROMIDE 100 MG/10ML IV SOLN
INTRAVENOUS | Status: DC | PRN
  Administered 2013-06-25: 10 mg via INTRAVENOUS
  Administered 2013-06-25: 20 mg via INTRAVENOUS
  Administered 2013-06-25: 10 mg via INTRAVENOUS

## 2013-06-25 MED ORDER — ALLOPURINOL 300 MG PO TABS
300.0000 mg | ORAL_TABLET | Freq: Every day | ORAL | Status: DC
  Administered 2013-06-26 – 2013-06-30 (×5): 300 mg via ORAL
  Filled 2013-06-25 (×5): qty 1

## 2013-06-25 MED ORDER — METOPROLOL TARTRATE 12.5 MG HALF TABLET
ORAL_TABLET | ORAL | Status: AC
  Filled 2013-06-25: qty 2

## 2013-06-25 MED ORDER — OXYCODONE-ACETAMINOPHEN 5-325 MG PO TABS
1.0000 | ORAL_TABLET | ORAL | Status: DC | PRN
  Administered 2013-06-26: 2 via ORAL
  Administered 2013-06-30: 1 via ORAL
  Filled 2013-06-25: qty 2
  Filled 2013-06-25: qty 1

## 2013-06-25 MED ORDER — PHENYLEPHRINE HCL 10 MG/ML IJ SOLN
INTRAMUSCULAR | Status: DC | PRN
  Administered 2013-06-25: 80 ug via INTRAVENOUS
  Administered 2013-06-25: 120 ug via INTRAVENOUS

## 2013-06-25 MED ORDER — CEFAZOLIN SODIUM-DEXTROSE 2-3 GM-% IV SOLR
2.0000 g | Freq: Once | INTRAVENOUS | Status: AC
  Administered 2013-06-25: 2 g via INTRAVENOUS
  Filled 2013-06-25 (×2): qty 50

## 2013-06-25 MED ORDER — METOCLOPRAMIDE HCL 10 MG PO TABS: 5.0000 mg | ORAL_TABLET | Freq: Three times a day (TID) | ORAL | Status: DC | PRN

## 2013-06-25 MED ORDER — FENTANYL CITRATE 0.05 MG/ML IJ SOLN
INTRAMUSCULAR | Status: DC | PRN
  Administered 2013-06-25 (×2): 100 ug via INTRAVENOUS
  Administered 2013-06-25: 50 ug via INTRAVENOUS

## 2013-06-25 MED ORDER — FENTANYL CITRATE 0.05 MG/ML IJ SOLN: 50.0000 ug | Freq: Once | INTRAMUSCULAR | Status: DC

## 2013-06-25 MED ORDER — PHENYLEPHRINE HCL 10 MG/ML IJ SOLN
10.0000 mg | INTRAMUSCULAR | Status: DC | PRN
  Administered 2013-06-25: 25 ug/min via INTRAVENOUS

## 2013-06-25 MED ORDER — METOCLOPRAMIDE HCL 5 MG/ML IJ SOLN: 5.0000 mg | Freq: Three times a day (TID) | INTRAMUSCULAR | Status: DC | PRN

## 2013-06-25 MED ORDER — NEOSTIGMINE METHYLSULFATE 1 MG/ML IJ SOLN
INTRAMUSCULAR | Status: DC | PRN
  Administered 2013-06-25: 4 mg via INTRAVENOUS

## 2013-06-25 MED ORDER — ONDANSETRON HCL 4 MG PO TABS: 4.0000 mg | ORAL_TABLET | Freq: Four times a day (QID) | ORAL | Status: DC | PRN

## 2013-06-25 MED ORDER — 0.9 % SODIUM CHLORIDE (POUR BTL) OPTIME
TOPICAL | Status: DC | PRN
  Administered 2013-06-25: 1000 mL

## 2013-06-25 SURGICAL SUPPLY — 59 items
BANDAGE ELASTIC 4 VELCRO ST LF (GAUZE/BANDAGES/DRESSINGS) IMPLANT
BANDAGE ELASTIC 6 VELCRO ST LF (GAUZE/BANDAGES/DRESSINGS) IMPLANT
BANDAGE ESMARK 6X9 LF (GAUZE/BANDAGES/DRESSINGS) IMPLANT
BANDAGE GAUZE ELAST BULKY 4 IN (GAUZE/BANDAGES/DRESSINGS) IMPLANT
BIT DRILL 3.8X6 NS (BIT) ×2 IMPLANT
BIT DRILL 5.3 NS (BIT) ×2 IMPLANT
BLADE SURG 15 STRL LF DISP TIS (BLADE) IMPLANT
BLADE SURG 15 STRL SS (BLADE)
BLADE SURG ROTATE 9660 (MISCELLANEOUS) IMPLANT
BNDG COHESIVE 6X5 TAN STRL LF (GAUZE/BANDAGES/DRESSINGS) ×4 IMPLANT
BNDG ESMARK 6X9 LF (GAUZE/BANDAGES/DRESSINGS)
CLOTH BEACON ORANGE TIMEOUT ST (SAFETY) ×2 IMPLANT
COVER SURGICAL LIGHT HANDLE (MISCELLANEOUS) ×2 IMPLANT
CUFF TOURNIQUET SINGLE 34IN LL (TOURNIQUET CUFF) IMPLANT
CUFF TOURNIQUET SINGLE 44IN (TOURNIQUET CUFF) IMPLANT
DRAPE C-ARM 42X72 X-RAY (DRAPES) IMPLANT
DRAPE ORTHO SPLIT 77X108 STRL (DRAPES)
DRAPE PROXIMA HALF (DRAPES) IMPLANT
DRAPE STERI IOBAN 125X83 (DRAPES) ×2 IMPLANT
DRAPE SURG ORHT 6 SPLT 77X108 (DRAPES) IMPLANT
DRAPE U-SHAPE 47X51 STRL (DRAPES) IMPLANT
DRSG ADAPTIC 3X8 NADH LF (GAUZE/BANDAGES/DRESSINGS) ×4 IMPLANT
DURAPREP 26ML APPLICATOR (WOUND CARE) ×2 IMPLANT
ELECT REM PT RETURN 9FT ADLT (ELECTROSURGICAL) ×2
ELECTRODE REM PT RTRN 9FT ADLT (ELECTROSURGICAL) ×1 IMPLANT
FACESHIELD LNG OPTICON STERILE (SAFETY) IMPLANT
GAUZE XEROFORM 5X9 LF (GAUZE/BANDAGES/DRESSINGS) IMPLANT
GLOVE BIOGEL PI IND STRL 7.5 (GLOVE) IMPLANT
GLOVE BIOGEL PI IND STRL 8 (GLOVE) ×1 IMPLANT
GLOVE BIOGEL PI INDICATOR 7.5 (GLOVE)
GLOVE BIOGEL PI INDICATOR 8 (GLOVE) ×1
GLOVE ECLIPSE 7.0 STRL STRAW (GLOVE) IMPLANT
GLOVE ORTHO TXT STRL SZ7.5 (GLOVE) ×2 IMPLANT
GOWN PREVENTION PLUS LG XLONG (DISPOSABLE) IMPLANT
GOWN PREVENTION PLUS XLARGE (GOWN DISPOSABLE) ×2 IMPLANT
GOWN STRL NON-REIN LRG LVL3 (GOWN DISPOSABLE) ×2 IMPLANT
GUIDEWIRE BALL NOSE 100CM (WIRE) ×2 IMPLANT
KIT BASIN OR (CUSTOM PROCEDURE TRAY) ×2 IMPLANT
KIT ROOM TURNOVER OR (KITS) ×2 IMPLANT
MANIFOLD NEPTUNE II (INSTRUMENTS) IMPLANT
NS IRRIG 1000ML POUR BTL (IV SOLUTION) ×2 IMPLANT
PACK GENERAL/GYN (CUSTOM PROCEDURE TRAY) ×2 IMPLANT
PAD ARMBOARD 7.5X6 YLW CONV (MISCELLANEOUS) ×4 IMPLANT
PIN GUIDE ACE (PIN) ×2 IMPLANT
SCREW ACE CORTICAL (Screw) ×2 IMPLANT
SCREW ACECAP 48MM (Screw) ×2 IMPLANT
SPONGE GAUZE 4X4 12PLY (GAUZE/BANDAGES/DRESSINGS) ×4 IMPLANT
STAPLER VISISTAT 35W (STAPLE) ×2 IMPLANT
STOCKINETTE IMPERVIOUS LG (DRAPES) IMPLANT
SUT VIC AB 0 CT1 27 (SUTURE) ×1
SUT VIC AB 0 CT1 27XBRD ANBCTR (SUTURE) ×1 IMPLANT
SUT VIC AB 2-0 CT1 27 (SUTURE) ×1
SUT VIC AB 2-0 CT1 TAPERPNT 27 (SUTURE) ×1 IMPLANT
TAPE CLOTH SURG 6X10 WHT LF (GAUZE/BANDAGES/DRESSINGS) ×4 IMPLANT
TOWEL OR 17X24 6PK STRL BLUE (TOWEL DISPOSABLE) ×2 IMPLANT
TOWEL OR 17X26 10 PK STRL BLUE (TOWEL DISPOSABLE) ×2 IMPLANT
TRAY FOLEY CATH 16FRSI W/METER (SET/KITS/TRAYS/PACK) ×2 IMPLANT
TROCH ENTRY NAIL R 11MMX44CM (Nail) ×2 IMPLANT
WATER STERILE IRR 1000ML POUR (IV SOLUTION) ×2 IMPLANT

## 2013-06-25 NOTE — ED Notes (Signed)
Portable xray at bedside. Pt is a x 4. Family member is at bedside.

## 2013-06-25 NOTE — Progress Notes (Signed)
Orthopedic Tech Progress Note Patient Details:  Marcus [name] 1945-09-16 [id number] Buck's traction applied to Right LE. Patient tolerated application well. Nurse instructed on proper care and transportation protocol. Musculoskeletal Traction Type of Traction: Bucks Skin Traction Traction Location: Right LE Traction Weight: 5 lbs    [name] Silos 06/25/2013, 11:09 AM

## 2013-06-25 NOTE — Preoperative (Signed)
Beta Blockers   Reason not to administer Beta Blockers:Not Applicable 

## 2013-06-25 NOTE — Interval H&P Note (Signed)
History and Physical Interval Note:  06/25/2013 5:13 PM  Marcus [name]  has presented today for surgery, with the diagnosis of Right Femur Fx.  The various methods of treatment have been discussed with the patient and family. After consideration of risks, benefits and other options for treatment, the patient has consented to  Procedure(s): INTRAMEDULLARY (IM) RETROGRADE FEMORAL NAILING (Right) as a surgical intervention .  The patient's history has been reviewed, patient examined, no change in status, stable for surgery.  I have reviewed the patient's chart and labs.  Questions were answered to the patient's satisfaction.     YATES,MARK C Discussed with patient due to his abrasions over patella and patellar tendon area will go antegrade with femoral nail vs. Retrograde as previously considered. Will be interlocking above and below. He agrees and wishes to proceed.

## 2013-06-25 NOTE — Brief Op Note (Signed)
06/25/2013  8:41 PM  PATIENT:  Marcus [name]  67 y.o. male  PRE-OPERATIVE DIAGNOSIS:  Right Femur Fracture  POST-OPERATIVE DIAGNOSIS:  same  PROCEDURE:  Procedure(s): INTRAMEDULLARY (IM) RETROGRADE FEMORAL NAILING (Right)  SURGEON:  Surgeon(s) and Role:    * Marybelle Killings, MD - Primary  PHYSICIAN ASSISTANT:   ASSISTANTS: none   ANESTHESIA:   general  EBL:  Total I/O In: 1950 [I.V.:1700; IV Piggyback:250] Out: 325 [Urine:200; Blood:125]  BLOOD ADMINISTERED:none  DRAINS: none   LOCAL MEDICATIONS USED:  MARCAINE     SPECIMEN:  No Specimen  DISPOSITION OF SPECIMEN:  N/A  COUNTS:  YES  TOURNIQUET:  * No tourniquets in log *  DICTATION: .Other Dictation: Dictation Number [id number]  PLAN OF CARE: already IP  PATIENT DISPOSITION:  PACU - hemodynamically stable.   Delay start of Pharmacological VTE agent (>24hrs) due to surgical blood loss or risk of bleeding: SCD andASA

## 2013-06-25 NOTE — Consult Note (Signed)
Okay to be cared for by Ortho.  This patient has been seen and I agree with the findings and treatment plan.  Kathryne Eriksson. Dahlia Bailiff, MD, [place] [phone] (pager) [phone] (direct pager) Trauma Surgeon

## 2013-06-25 NOTE — Consult Note (Signed)
Reason for Consult:Trauma Referring Physician: Jude [name] is an 67 y.o. male.  HPI: Marcus [name] was exiting a backhoe when his coat got caught on one of the levers causing the boom to swing around and trap his right leg between it and the tractor. He was brought to [facility] as a level 2 trauma due to possible crush injury. He denies loss of consciousness and is not amnestic to the event. X-rays revealed a right femur fracture. Trauma was asked to consult.  Past Medical History  Diagnosis Date  . Atrial flutter   . AF (atrial fibrillation)   . Gout     Past Surgical History  Procedure Laterality Date  . Catheter ablation  12/23/2004    No family history on file.  Social History:  reports that he has never smoked. He does not have any smokeless tobacco history on file. He reports that he does not drink alcohol or use illicit drugs.  Allergies: No Known Allergies  Medications: I have reviewed the patient's current medications.  Results for orders placed during the hospital encounter of 06/25/13 (from the past 48 hour(s))  CBC WITH DIFFERENTIAL     Status: Abnormal   Collection Time    06/25/13  9:25 AM      Result Value Range   WBC 9.5  4.0 - 10.5 K/uL   RBC 5.20  4.22 - 5.81 MIL/uL   Hemoglobin 16.4  13.0 - 17.0 g/dL   HCT 49.1  39.0 - 52.0 %   MCV 94.4  78.0 - 100.0 fL   MCH 31.5  26.0 - 34.0 pg   MCHC 33.4  30.0 - 36.0 g/dL   RDW 14.1  11.5 - 15.5 %   Platelets 164  150 - 400 K/uL   Neutrophils Relative % 79 (*) 43 - 77 %   Neutro Abs 7.5  1.7 - 7.7 K/uL   Lymphocytes Relative 15  12 - 46 %   Lymphs Abs 1.4  0.7 - 4.0 K/uL   Monocytes Relative 6  3 - 12 %   Monocytes Absolute 0.6  0.1 - 1.0 K/uL   Eosinophils Relative 0  0 - 5 %   Eosinophils Absolute 0.0  0.0 - 0.7 K/uL   Basophils Relative 0  0 - 1 %   Basophils Absolute 0.0  0.0 - 0.1 K/uL  BASIC METABOLIC PANEL     Status: Abnormal   Collection Time    06/25/13  9:25 AM      Result Value Range    Sodium 137  135 - 145 mEq/L   Potassium 5.2 (*) 3.5 - 5.1 mEq/L   Chloride 102  96 - 112 mEq/L   CO2 24  19 - 32 mEq/L   Glucose, Bld 294 (*) 70 - 99 mg/dL   BUN 26 (*) 6 - 23 mg/dL   Creatinine, Ser 1.16  0.50 - 1.35 mg/dL   Calcium 8.9  8.4 - 10.5 mg/dL   GFR calc non Af Amer 62 (*) >90 mL/min   GFR calc Af Amer 72 (*) >90 mL/min   Comment: (NOTE)     The eGFR has been calculated using the CKD EPI equation.     This calculation has not been validated in all clinical situations.     eGFR's persistently <90 mL/min signify possible Chronic Kidney     Disease.  GLUCOSE, CAPILLARY     Status: Abnormal   Collection Time    06/25/13  9:28  AM      Result Value Range   Glucose-Capillary 173 (*) 70 - 99 mg/dL  TYPE AND SCREEN     Status: None   Collection Time    06/25/13 10:32 AM      Result Value Range   ABO/RH(D) O POS     Antibody Screen NEG     Sample Expiration 06/28/2013    GLUCOSE, CAPILLARY     Status: Abnormal   Collection Time    06/25/13 11:11 AM      Result Value Range   Glucose-Capillary 144 (*) 70 - 99 mg/dL    Dg Tibia/fibula Right  06/25/2013   CLINICAL DATA:  Trauma  EXAM: RIGHT TIBIA AND FIBULA - 2 VIEW  COMPARISON:  None.  FINDINGS: Three views of the right tibia-fibula submitted. No acute fracture or subluxation.  IMPRESSION: No acute fracture or subluxation.   Electronically Signed   By: Lahoma Crocker M.D.   On: 06/25/2013 10:21   Dg Femur Right Port  06/25/2013   CLINICAL DATA:  Trauma  EXAM: PORTABLE RIGHT FEMUR - 2 VIEW  COMPARISON:  None.  FINDINGS: Five views of the right femur submitted. There is displaced comminuted fracture distal shaft of right femur.  IMPRESSION: Displaced comminuted fracture of distal shaft right femur   Electronically Signed   By: Lahoma Crocker M.D.   On: 06/25/2013 10:21    Review of Systems  Constitutional: Negative for weight loss.  HENT: Negative for ear discharge, ear pain, hearing loss and tinnitus.   Eyes: Negative for blurred  vision, double vision, photophobia and pain.  Respiratory: Negative for cough, sputum production and shortness of breath.   Cardiovascular: Negative for chest pain.  Gastrointestinal: Negative for nausea, vomiting and abdominal pain.  Genitourinary: Negative for dysuria, urgency, frequency and flank pain.  Musculoskeletal: Positive for joint pain (RLE). Negative for back pain, falls, myalgias and neck pain.  Neurological: Positive for sensory change (Bilateral feet -- chronic). Negative for dizziness, tingling, focal weakness, loss of consciousness and headaches.  Endo/Heme/Allergies: Does not bruise/bleed easily.  Psychiatric/Behavioral: Negative for depression, memory loss and substance abuse. The patient is not nervous/anxious.    Blood pressure 133/80, pulse 75, temperature 97.8 F (36.6 C), temperature source Oral, resp. rate 21, height 6' (1.829 m), weight 190 lb (86.183 kg), SpO2 100.00%. Physical Exam  Vitals reviewed. Constitutional: He is oriented to person, place, and time. He appears well-developed and well-nourished. He is cooperative. No distress. Cervical collar and nasal cannula in place.  HENT:  Head: Normocephalic and atraumatic. Head is without raccoon's eyes, without Battle's sign, without abrasion, without contusion and without laceration.  Right Ear: Hearing, tympanic membrane, external ear and ear canal normal. No lacerations. No drainage or tenderness. No foreign bodies. Tympanic membrane is not perforated. No hemotympanum.  Left Ear: Hearing, tympanic membrane, external ear and ear canal normal. No lacerations. No drainage or tenderness. No foreign bodies. Tympanic membrane is not perforated. No hemotympanum.  Nose: Nose normal. No nose lacerations, sinus tenderness, nasal deformity or nasal septal hematoma. No epistaxis.  Mouth/Throat: Uvula is midline, oropharynx is clear and moist and mucous membranes are normal. No lacerations. No oropharyngeal exudate.  Eyes:  Conjunctivae, EOM and lids are normal. Right eye exhibits no discharge. Left eye exhibits no discharge. No scleral icterus.  Neck: Trachea normal and normal range of motion. Neck supple. No JVD present. No spinous process tenderness and no muscular tenderness present. Carotid bruit is not present. No tracheal deviation present.  No thyromegaly present.  Cardiovascular: Normal rate, normal heart sounds, intact distal pulses and normal pulses.  An irregularly irregular rhythm present. Exam reveals no gallop and no friction rub.   No murmur heard. Respiratory: Effort normal. No stridor. No respiratory distress. He has no wheezes. He has no rales. He exhibits no tenderness, no bony tenderness, no laceration and no crepitus.  GI: Soft. Normal appearance and bowel sounds are normal. He exhibits no distension. There is no tenderness. There is no rigidity, no rebound, no guarding and no CVA tenderness.  Musculoskeletal: Normal range of motion. He exhibits no edema.       Right upper leg: He exhibits tenderness.  Lymphadenopathy:    He has no cervical adenopathy.  Neurological: He is alert and oriented to person, place, and time. He has normal strength. No cranial nerve deficit or sensory deficit. GCS eye subscore is 4. GCS verbal subscore is 5. GCS motor subscore is 6.  Skin: Skin is warm, dry and intact. He is not diaphoretic.  Psychiatric: He has a normal mood and affect. His speech is normal and behavior is normal.    Assessment/Plan: Blunt LE trauma Left femur fx -- Yates to repair later today. Afib -- Stable on current meds Gout -- Stable  No indication of other systemic injuries. [facility] for ortho to admit. If afib becomes a problem while in the hospital suggest consult Dr. Lovena Le who is the patient's cardiologist.    Lisette Abu, PA-C Pager: [phone] General Trauma PA Pager: [phone]  06/25/2013, 12:04 PM

## 2013-06-25 NOTE — ED Notes (Signed)
Per EMS-  Pt right thigh pinned between backhoe bucket and right rear tire. He was pinned for a short time, was unable  To unpin himself. Right thigh deformity present. Initial pain 3/10, became 1/10 after leg was splinted. Was initially reported no right pedal pulse but pulse was found upon arrival. Capillary refill is diminished on right foot. Sensation is intact. BP 136/88, AFIB rhythm. Pt is a x 4. Speaking in clear, concise sentences.

## 2013-06-25 NOTE — Anesthesia Postprocedure Evaluation (Signed)
Anesthesia Post Note  Patient: Marcus [name]  Procedure(s) Performed: Procedure(s) (LRB): INTRAMEDULLARY (IM) RETROGRADE FEMORAL NAILING (Right)  Anesthesia type: general  Patient location: PACU  Post pain: Pain level controlled  Post assessment: Patient's Cardiovascular Status Stable  Last Vitals:  Filed Vitals:   06/25/13 2133  BP: 103/58  Pulse: 87  Temp: 36.5 C  Resp: 18    Post vital signs: Reviewed and stable  Level of consciousness: sedated  Complications: No apparent anesthesia complications

## 2013-06-25 NOTE — ED Notes (Signed)
Buck's traction being applied.

## 2013-06-25 NOTE — Anesthesia Procedure Notes (Signed)
Procedure Name: Intubation Date/Time: 06/25/2013 6:12 PM Performed by: Eligha Bridegroom Pre-anesthesia Checklist: Patient identified, Timeout performed, Emergency Drugs available, Suction available and Patient being monitored Patient Re-evaluated:Patient Re-evaluated prior to inductionOxygen Delivery Method: Circle system utilized Preoxygenation: Pre-oxygenation with 100% oxygen Intubation Type: IV induction, Rapid sequence and Cricoid Pressure applied Grade View: Grade II Tube type: Oral Tube size: 7.5 mm Placement Confirmation: ETT inserted through vocal cords under direct vision,  breath sounds checked- equal and bilateral and positive ETCO2 Secured at: 22 cm Tube secured with: Tape Dental Injury: Teeth and Oropharynx as per pre-operative assessment

## 2013-06-25 NOTE — H&P (Addendum)
Marcus [name] is an 67 y.o. male.   Chief Complaint: right leg pain  HPI: Pts right leg trapped between back hoe and tire.  Pt required assistance for removal and was brought to ED for evaluation.  Complaints of pain in the right thigh with deformity and inability to bear weight on right leg.   Radiographs in ED showed displaced and comminuted fracture of the right femoral shaft.  Admitted for surgical intervention.  Past Medical History  Diagnosis Date  . Atrial flutter   . AF (atrial fibrillation)   . Gout     Past Surgical History  Procedure Laterality Date  . Catheter ablation  12/23/2004    No family history on file. Social History:  reports that he has never smoked. He does not have any smokeless tobacco history on file. He reports that he does not drink alcohol or use illicit drugs.  Allergies: No Known Allergies  Medications Prior to Admission  Medication Sig Dispense Refill  . allopurinol (ZYLOPRIM) 300 MG tablet Take 300 mg by mouth daily.      Marland Kitchen aspirin 81 MG tablet Take 81 mg by mouth at bedtime.       . metoprolol (LOPRESSOR) 50 MG tablet Take 75 mg by mouth 2 (two) times daily.        Results for orders placed during the hospital encounter of 06/25/13 (from the past 48 hour(s))  CBC WITH DIFFERENTIAL     Status: Abnormal   Collection Time    06/25/13  9:25 AM      Result Value Range   WBC 9.5  4.0 - 10.5 K/uL   RBC 5.20  4.22 - 5.81 MIL/uL   Hemoglobin 16.4  13.0 - 17.0 g/dL   HCT 49.1  39.0 - 52.0 %   MCV 94.4  78.0 - 100.0 fL   MCH 31.5  26.0 - 34.0 pg   MCHC 33.4  30.0 - 36.0 g/dL   RDW 14.1  11.5 - 15.5 %   Platelets 164  150 - 400 K/uL   Neutrophils Relative % 79 (*) 43 - 77 %   Neutro Abs 7.5  1.7 - 7.7 K/uL   Lymphocytes Relative 15  12 - 46 %   Lymphs Abs 1.4  0.7 - 4.0 K/uL   Monocytes Relative 6  3 - 12 %   Monocytes Absolute 0.6  0.1 - 1.0 K/uL   Eosinophils Relative 0  0 - 5 %   Eosinophils Absolute 0.0  0.0 - 0.7 K/uL   Basophils Relative 0  0 - 1 %   Basophils Absolute 0.0  0.0 - 0.1 K/uL  BASIC METABOLIC PANEL     Status: Abnormal   Collection Time    06/25/13  9:25 AM      Result Value Range   Sodium 137  135 - 145 mEq/L   Potassium 5.2 (*) 3.5 - 5.1 mEq/L   Chloride 102  96 - 112 mEq/L   CO2 24  19 - 32 mEq/L   Glucose, Bld 294 (*) 70 - 99 mg/dL   BUN 26 (*) 6 - 23 mg/dL   Creatinine, Ser 1.16  0.50 - 1.35 mg/dL   Calcium 8.9  8.4 - 10.5 mg/dL   GFR calc non Af Amer 62 (*) >90 mL/min   GFR calc Af Amer 72 (*) >90 mL/min   Comment: (NOTE)     The eGFR has been calculated using the CKD EPI equation.  This calculation has not been validated in all clinical situations.     eGFR's persistently <90 mL/min signify possible Chronic Kidney     Disease.  GLUCOSE, CAPILLARY     Status: Abnormal   Collection Time    06/25/13  9:28 AM      Result Value Range   Glucose-Capillary 173 (*) 70 - 99 mg/dL  TYPE AND SCREEN     Status: None   Collection Time    06/25/13 10:32 AM      Result Value Range   ABO/RH(D) O POS     Antibody Screen NEG     Sample Expiration 06/28/2013    ABO/RH     Status: None   Collection Time    06/25/13 10:32 AM      Result Value Range   ABO/RH(D) O POS    GLUCOSE, CAPILLARY     Status: Abnormal   Collection Time    06/25/13 11:11 AM      Result Value Range   Glucose-Capillary 144 (*) 70 - 99 mg/dL   Dg Tibia/fibula Right  06/25/2013   CLINICAL DATA:  Trauma  EXAM: RIGHT TIBIA AND FIBULA - 2 VIEW  COMPARISON:  None.  FINDINGS: Three views of the right tibia-fibula submitted. No acute fracture or subluxation.  IMPRESSION: No acute fracture or subluxation.   Electronically Signed   By: Lahoma Crocker M.D.   On: 06/25/2013 10:21   Dg Femur Right Port  06/25/2013   CLINICAL DATA:  Trauma  EXAM: PORTABLE RIGHT FEMUR - 2 VIEW  COMPARISON:  None.  FINDINGS: Five views of the right femur submitted. There is displaced comminuted fracture distal shaft of right femur.  IMPRESSION:  Displaced comminuted fracture of distal shaft right femur   Electronically Signed   By: Lahoma Crocker M.D.   On: 06/25/2013 10:21    Review of Systems  Musculoskeletal:       Pain in the right leg  All other systems reviewed and are negative.    Blood pressure 120/86, pulse 84, temperature 98.4 F (36.9 C), temperature source Oral, resp. rate 20, height 6' (1.829 m), weight 86.183 kg (190 lb), SpO2 100.00%. Physical Exam  Constitutional: He is oriented to person, place, and time. He appears well-developed and well-nourished.  HENT:  Head: Normocephalic and atraumatic.  Eyes: EOM are normal. Pupils are equal, round, and reactive to light.  Cardiovascular: Normal rate.   Respiratory: Effort normal.  GI: Soft.  Musculoskeletal:  Pt splinted.  Cap refill of toes intact.  DP pulse of the right foot intact. Sensation of right foot intact.  Neurological: He is alert and oriented to person, place, and time.  Skin: Skin is warm and dry.   skin over his right knee has abrasions with bleeding.     Assessment/Plan Comminuted displaced right femoral shaft fracture  PLAN:  IM nailing of right femur fracture by DR Lorin Mercy.  Consent obtained.  NPO for surgery today.  VERNON,SHEILA M 06/25/2013, 3:39 PM

## 2013-06-25 NOTE — ED Provider Notes (Signed)
CSN: [id number]     Arrival date & time 06/25/13  3818 History   First MD Initiated Contact with Patient 06/25/13 [phone]     Chief Complaint  Patient presents with  . Trauma    Patient is a 67 y.o. male presenting with trauma. The history is provided by the patient.  Trauma Mechanism of injury: crush injury Injury location: leg Injury location detail: R leg Incident location: around machinery Time since incident: just prior to arrival. Arrived directly from scene: yes  Crush injury:      Mechanism: industrial machinery      Duration of crushing force: a few seconds.   EMS/PTA data:      Responsiveness: alert      Oriented to: person, place, time and situation      Loss of consciousness: no      Airway interventions: none      Breathing interventions: none      Immobilization: RLE splint  Current symptoms:      Associated symptoms:            Denies chest pain and loss of consciousness.   Pt was at work and his right leg was crushed between tire and backhoe bucket No head injury No neck or back injury No LOC No cp/sob.   He reports mild pain in his right leg His course is stable It is worsened with movement He is not on coumadin (only on ASA for afib) Last meal was this morning Past Medical History  Diagnosis Date  . Atrial flutter   . AF (atrial fibrillation)   . Gout    Past Surgical History  Procedure Laterality Date  . Catheter ablation  12/23/2004   No family history on file. History  Substance Use Topics  . Smoking status: Never Smoker   . Smokeless tobacco: Not on file  . Alcohol Use: Not on file    Review of Systems  Constitutional: Negative for fever.  Respiratory: Negative for shortness of breath.   Cardiovascular: Negative for chest pain.  Skin: Positive for wound.  Neurological: Negative for loss of consciousness.  All other systems reviewed and are negative.    Allergies  Review of patient's allergies indicates no known  allergies.  Home Medications   Current Outpatient Rx  Name  Route  Sig  Dispense  Refill  . allopurinol (ZYLOPRIM) 300 MG tablet   Oral   Take 300 mg by mouth daily.         Marland Kitchen aspirin 81 MG tablet   Oral   Take 160 mg by mouth daily.         . metoprolol (LOPRESSOR) 50 MG tablet      TAKE 1 & 1/2 TABLETS BY MOUTH TWICE DAILY.   90 tablet   11    Physical Exam CONSTITUTIONAL: Well developed/well nourished HEAD: Normocephalic/atraumatic EYES: EOMI/PERRL ENMT: Mucous membranes moist NECK: supple no meningeal signs SPINE:entire spine nontender, No bruising/crepitance/stepoffs noted to spine CV: S1/S2 noted, no murmurs/rubs/gallops noted LUNGS: Lungs are clear to auscultation bilaterally, no apparent distress ABDOMEN: soft, nontender, no rebound or guarding GU:no cva tenderness NEURO: Pt is awake/alert, moves all extremitiesx4 EXTREMITIES: pulses normal/equal in bilateral LE.  He has tenderness to palpation of right femur and right tib/fib.  He has abrasion over right patella.  No active bleeding.  No large lacerations noted All other extremities/joints palpated/ranged and nontender SKIN: warm, color normal PSYCH: no abnormalities of mood noted  ED Course  Procedures (including critical  care time) Labs Review Labs Reviewed  CBC WITH DIFFERENTIAL  BASIC METABOLIC PANEL  TYPE AND SCREEN   Imaging Review No results found.  EKG Interpretation   None     10:31 AM D/w ortho dr Lorin Mercy, will see patient Pt stable, distal pulses intact He was updated on plan No other signs of trauma  MDM  No diagnosis found. Nursing notes including past medical history and social history reviewed and considered in documentation xrays reviewed and considered Labs/vital reviewed and considered     Sharyon Cable, MD 06/25/13 1031

## 2013-06-25 NOTE — Progress Notes (Signed)
Visited with patient at bedside. Pt alert. Per EMS- Pt right thigh pinned between backhoe bucket and right rear tire. He was pinned for a short time and was unable to free himself.  Patient family in route to hospital.  Will follow as needed.  06/25/13 0900  Clinical Encounter Type  Visited With Patient;Health care provider  Visit Type Spiritual support;ED;Trauma  Referral From Chaplain  Spiritual Encounters  Spiritual Needs Emotional  Stress Factors  Patient Stress Factors None identified  .....Marland KitchenMarland KitchenJennings, [place], [place]

## 2013-06-25 NOTE — Anesthesia Preprocedure Evaluation (Signed)
Anesthesia Evaluation  Patient identified by MRN, date of birth, ID band Patient awake    Reviewed: Allergy & Precautions, H&P , NPO status , Patient's Chart, lab work & pertinent test results, reviewed documented beta blocker date and time   History of Anesthesia Complications Negative for: history of anesthetic complications  Airway Mallampati: II TM Distance: >3 FB Neck ROM: Full    Dental  (+) Teeth Intact and Dental Advisory Given   Pulmonary neg pulmonary ROS,  breath sounds clear to auscultation  Pulmonary exam normal       Cardiovascular hypertension, Pt. on medications and Pt. on home beta blockers + dysrhythmias (s/p ablation '06) Atrial Fibrillation Rhythm:Irregular Rate:Normal  '07 treadmill test low risk   Neuro/Psych negative neurological ROS     GI/Hepatic negative GI ROS, Neg liver ROS,   Endo/Other  diabetes (diet management now, glu 104)  Renal/GU negative Renal ROS     Musculoskeletal   Abdominal   Peds  Hematology   Anesthesia Other Findings   Reproductive/Obstetrics                           Anesthesia Physical Anesthesia Plan  ASA: III  Anesthesia Plan: General   Post-op Pain Management:    Induction: Intravenous  Airway Management Planned: Oral ETT  Additional Equipment:   Intra-op Plan:   Post-operative Plan: Extubation in OR  Informed Consent: I have reviewed the patients History and Physical, chart, labs and discussed the procedure including the risks, benefits and alternatives for the proposed anesthesia with the patient or authorized representative who has indicated his/her understanding and acceptance.   Dental advisory given  Plan Discussed with: CRNA and Surgeon  Anesthesia Plan Comments: (Plan routine monitors, GETA)        Anesthesia Quick Evaluation

## 2013-06-25 NOTE — Transfer of Care (Signed)
Immediate Anesthesia Transfer of Care Note  Patient: Marcus [name]  Procedure(s) Performed: Procedure(s): INTRAMEDULLARY (IM) RETROGRADE FEMORAL NAILING (Right)  Patient Location: PACU  Anesthesia Type:General  Level of Consciousness: awake, alert  and oriented  Airway & Oxygen Therapy: Patient Spontanous Breathing and Patient connected to nasal cannula oxygen  Post-op Assessment: Report given to PACU RN and Post -op Vital signs reviewed and stable  Post vital signs: Reviewed and stable  Complications: No apparent anesthesia complications

## 2013-06-26 LAB — GLUCOSE, CAPILLARY
Glucose-Capillary: 114 mg/dL — ABNORMAL HIGH (ref 70–99)
Glucose-Capillary: 112 mg/dL — ABNORMAL HIGH (ref 70–99)
Glucose-Capillary: 159 mg/dL — ABNORMAL HIGH (ref 70–99)

## 2013-06-26 MED ORDER — INSULIN GLARGINE 100 UNIT/ML ~~LOC~~ SOLN
10.0000 [IU] | Freq: Every day | SUBCUTANEOUS | Status: DC
  Administered 2013-06-26: 10 [IU] via SUBCUTANEOUS
  Filled 2013-06-26 (×2): qty 0.1

## 2013-06-26 MED ORDER — INSULIN ASPART 100 UNIT/ML ~~LOC~~ SOLN
0.0000 [IU] | Freq: Three times a day (TID) | SUBCUTANEOUS | Status: DC
  Administered 2013-06-26: 2 [IU] via SUBCUTANEOUS
  Administered 2013-06-27 – 2013-06-28 (×3): 1 [IU] via SUBCUTANEOUS
  Administered 2013-06-28: 2 [IU] via SUBCUTANEOUS
  Administered 2013-06-29 (×2): 1 [IU] via SUBCUTANEOUS

## 2013-06-26 NOTE — Evaluation (Signed)
Physical Therapy Evaluation Patient Details Name: Marcus [name] MRN: [id number] DOB: Dec 20, 1945 Today's Date: 06/26/2013 Time: 4097-3532 PT Time Calculation (min): 23 min  PT Assessment / Plan / Recommendation History of Present Illness  Pt is a 67 y/o male admitted after getting right leg trapped between back hoe and tire. He is now s/p right femoral IM nailing and is WBAT.  Clinical Impression  This patient presents with acute pain and decreased functional independence following the above mentioned procedure. At the time of PT eval, pt felt dizzy while supine and sitting on EOB. After ~5 minutes pt reports dizziness subsides and is able to continue with session. This patient is appropriate for skilled PT interventions to address functional limitations, improve safety and independence with functional mobility, and return to PLOF.     PT Assessment  Patient needs continued PT services    Follow Up Recommendations  SNF    Does the patient have the potential to tolerate intense rehabilitation      Barriers to Discharge Decreased caregiver support Pt reports his wife is unable to help him with much physically    Equipment Recommendations       Recommendations for Other Services     Frequency Min 5X/week    Precautions / Restrictions Precautions Precautions: Fall Restrictions Weight Bearing Restrictions: Yes RLE Weight Bearing: Weight bearing as tolerated   Pertinent Vitals/Pain Pt reports 7/10 pain during ambulation      Mobility  Bed Mobility Bed Mobility: Supine to Sit;Sitting - Scoot to Edge of Bed Supine to Sit: 3: Mod assist;HOB elevated;With rails Sitting - Scoot to Edge of Bed: 3: Mod assist;With rail Details for Bed Mobility Assistance: VC's for sequencing and technique during transition to EOB. Assist for RLE support and movement.  Transfers Transfers: Sit to Stand;Stand to Sit Sit to Stand: 3: Mod assist;From elevated surface;With upper extremity  assist;From bed Stand to Sit: 3: Mod assist;With upper extremity assist;With armrests;To chair/3-in-1 Details for Transfer Assistance: VC's for hand placement on seated surface.  Ambulation/Gait Ambulation/Gait Assistance: 4: Min assist Ambulation Distance (Feet): 5 Feet Assistive device: Rolling walker Ambulation/Gait Assistance Details: Assist to unweight while taking steps. VC's for sequencing and safety awareness with the RW.  Gait Pattern: Step-to pattern;Decreased stride length;Narrow base of support;Trunk flexed Gait velocity: Decreased    Exercises General Exercises - Lower Extremity Ankle Circles/Pumps: 10 reps Quad Sets: 10 reps   PT Diagnosis: Difficulty walking;Acute pain  PT Problem List: Decreased strength;Decreased range of motion;Decreased activity tolerance;Decreased balance;Decreased mobility;Decreased safety awareness;Decreased knowledge of use of DME;Pain PT Treatment Interventions: DME instruction;Gait training;Stair training;Functional mobility training;Therapeutic activities;Therapeutic exercise;Neuromuscular re-education;Patient/family education     PT Goals(Current goals can be found in the care plan section) Acute Rehab PT Goals Patient Stated Goal: To return home with wife PT Goal Formulation: With patient Time For Goal Achievement: 07/03/13 Potential to Achieve Goals: Good  Visit Information  Last PT Received On: 06/26/13 Assistance Needed: +1 PT/OT/SLP Co-Evaluation/Treatment: Yes PT goals addressed during session: Mobility/safety with mobility;Balance;Proper use of DME;Strengthening/ROM History of Present Illness: Pt is a 67 y/o male admitted after getting right leg trapped between back hoe and tire. He is now s/p right femoral IM nailing and is WBAT.       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 Dominant Hand: Right    Cognition  Cognition Arousal/Alertness: Awake/alert Behavior During Therapy: WFL for tasks assessed/performed Overall Cognitive Status: Within Functional Limits for tasks assessed    Extremity/Trunk Assessment Upper Extremity Assessment Upper Extremity Assessment: Overall WFL for tasks assessed Lower Extremity Assessment Lower Extremity Assessment: RLE deficits/detail RLE Deficits / Details: Decreased strength and AROM consistent with femoral IM nailing RLE: Unable to fully assess due to pain Cervical / Trunk Assessment Cervical / Trunk Assessment: Normal   Balance Balance Balance Assessed: Yes Static Sitting Balance Static Sitting - Balance Support: Feet supported;No upper extremity supported Static Sitting - Level of Assistance: 5: Stand by assistance Static Sitting - Comment/# of Minutes: 5 minutes while dizziness subsides Static Standing Balance Static Standing - Balance Support: Bilateral upper extremity supported Static Standing - Level of Assistance: 4: Min assist  End of Session PT - End of Session Equipment Utilized During Treatment: Gait belt Activity Tolerance: Patient tolerated treatment well Patient left: in chair;with call bell/phone within reach Nurse Communication: Mobility status  GP     Jolyn Lent 06/26/2013, 11:24 AM  Jolyn Lent, PT, DPT [phone]

## 2013-06-26 NOTE — Clinical Social Work Psychosocial (Signed)
Clinical Social Work Department  BRIEF PSYCHOSOCIAL ASSESSMENT  Patient: Marcus [name]  Account Number: [id number]  Admit date: 06/25/13 Clinical Social Worker Rhea Pink, MSW Date/Time: 06/26/2013 3:00 PM Referred by: Physician Date Referred:  Referred for   SNF Placement   Other Referral:  Interview type: Patient  Other interview type: PSYCHOSOCIAL DATA  Living Status: [place] Admitted from facility:  Level of care:  Primary support name: Hamric,Martha  Primary support relationship to patient: Wife Degree of support available:  Strong and vested  CURRENT CONCERNS  Current Concerns   Post-Acute Placement   Other Concerns:  SOCIAL WORK ASSESSMENT / PLAN  CSW met with pt re: PT recommendation for SNF.   Pt lives with his spouse  CSW explained placement process and answered questions.   Pt reports [facility]  as her preference    CSW completed FL2 and initiated SNF search.     Assessment/plan status: Information/Referral to [facility]  Other assessment/ plan:  Information/referral to community resources:  SNF     PATIENT'S/FAMILY'S RESPONSE TO PLAN OF CARE:  Pt  reports he would like to go home but will be agreeable to is [facility] SNF in order to increase strength and independence with mobility prior to returning home  Pt verbalized understanding of placement process and appreciation for CSW assist.   Rhea Pink, MSW, [place]

## 2013-06-26 NOTE — Progress Notes (Signed)
Subjective: 1 Day Post-Op Procedure(s) (LRB): INTRAMEDULLARY (IM) RETROGRADE FEMORAL NAILING (Right) Patient reports pain as mild.    Objective: Vital signs in last 24 hours: Temp:  [97.7 F (36.5 C)-99.7 F (37.6 C)] 97.9 F (36.6 C) (12/02 0624) Pulse Rate:  [61-89] 82 (12/02 0624) Resp:  [14-23] 18 (12/02 0624) BP: (103-135)/(58-94) 104/65 mmHg (12/02 0624) SpO2:  [90 %-100 %] 99 % (12/02 0624) Weight:  [86.183 kg (190 lb)] 86.183 kg (190 lb) (12/01 0934)  Intake/Output from previous day: 12/01 0701 - 12/02 0700 In: 3380 [P.O.:60; I.V.:2820; IV Piggyback:500] Out: 3014 [Urine:1000; Blood:125] Intake/Output this shift:     Recent Labs  06/25/13 0925  HGB 16.4    Recent Labs  06/25/13 0925  WBC 9.5  RBC 5.20  HCT 49.1  PLT 164    Recent Labs  06/25/13 0925  NA 137  K 5.2*  CL 102  CO2 24  BUN 26*  CREATININE 1.16  GLUCOSE 294*  CALCIUM 8.9   No results found for this basename: LABPT, INR,  in the last 72 hours  Neurologically intact  Assessment/Plan: 1 Day Post-Op Procedure(s) (LRB): INTRAMEDULLARY (IM) RETROGRADE FEMORAL NAILING (Right) Up with therapy.    YATES,MARK C 06/26/2013, 7:45 AM

## 2013-06-26 NOTE — Clinical Social Work Placement (Signed)
Clinical Social Work Rule NOTE    Patient: Marcus [name]  Account [id number] Admit date: 06/25/13 Clinical Social Worker: Rhea Pink LCSWA Date/time: 06/26/2013 2:57 PM Clinical Social Work is seeking post-discharge placement for this patient at the following level of care: SKILLED NURSING (*CSW will update this form in Epic as items are completed)  06/26/2013 Patient/family provided with [facility] Department of Clinical Social Work's list of facilities offering this level of care within the geographic area requested by the patient (or if unable, by the patient's family).  12/2/2014Patient/family informed of their freedom to choose among providers that offer the needed level of care, that participate in Medicare, Medicaid or managed care program needed by the patient, have an available bed and are willing to accept the patient.  06/26/2013 Patient/family informed of [facility]' ownership interest in [facility], as well as of the fact that they are under no obligation to receive care at this facility.  PASARR submitted to EDS on   PASARR number received from [place] on  FL2 transmitted to all facilities in geographic area requested by pt/family on 06/26/2013 FL2 transmitted to all facilities within larger geographic area on  Patient informed that his/her managed care company has contracts with or will negotiate with certain facilities, including the following:  Patient/family informed of bed offers received:   Patient chooses bed at  Physician recommends and patient chooses bed at  Patient to be transferred to on  Patient to be transferred to facility by  The following physician request were entered in Epic:  Additional Comments:

## 2013-06-26 NOTE — Evaluation (Addendum)
Occupational Therapy Evaluation Patient Details Name: Marcus [name] MRN: [id number] DOB: 03-05-1946 Today's Date: 06/26/2013 Time: 7824-2353 OT Time Calculation (min): 24 min  OT Assessment / Plan / Recommendation History of present illness INTRAMEDULLARY (IM) RETROGRADE FEMORAL NAILING (Right   Clinical Impression   This 67 yo male admitted and underwent above presents to acute OT with increased pain with activity, decreased ability to weight bear on RLE due to pain, decreased AROM of RLE due pt pain, and decreased balance all affecting pts's PLOF of Independent with BADLs, will benefit from acute OT with follow up at SNF.    OT Assessment  Patient needs continued OT Services    Follow Up Recommendations  SNF (may be able to go home with [facility] (depends on progress))       Equipment Recommendations  3 in 1 bedside comode       Frequency  Min 2X/week    Precautions / Restrictions Precautions Precautions: Fall Restrictions Weight Bearing Restrictions: Yes RLE Weight Bearing: Weight bearing as tolerated   Pertinent Vitals/Pain 7/10 RLE with activity (weightbearing); repositioned    ADL  Eating/Feeding: Independent Where Assessed - Eating/Feeding: Edge of bed Grooming: Set up Where Assessed - Grooming: Unsupported sitting Upper Body Bathing: Set up Where Assessed - Upper Body Bathing: Unsupported sitting Lower Body Bathing: Maximal assistance Where Assessed - Lower Body Bathing: Supported sit to stand Upper Body Dressing: Set up Where Assessed - Upper Body Dressing: Unsupported sitting Lower Body Dressing: +1 Total assistance Where Assessed - Lower Body Dressing: Supported sit to Lobbyist: Moderate assistance Toilet Transfer Method: Sit to Loss adjuster, chartered:  (Bed>7 steps>sit in recliner behind him) Toileting - Water quality scientist and Hygiene: Moderate assistance Where Assessed - Toileting Clothing Manipulation and Hygiene:   (sit<>stand) Equipment Used: Gait belt;Rolling walker Transfers/Ambulation Related to ADLs: Mod A sit<>stand and ambulation with RW    OT Diagnosis: Generalized weakness;Acute pain  OT Problem List: Decreased strength;Decreased range of motion;Impaired balance (sitting and/or standing);Pain;Decreased knowledge of use of DME or AE OT Treatment Interventions: Self-care/ADL training;Balance training;Therapeutic activities;DME and/or AE instruction;Patient/family education   OT Goals(Current goals can be found in the care plan section) Acute Rehab OT Goals Patient Stated Goal: Hopeful to go home OT Goal Formulation: With patient Time For Goal Achievement: 07/10/13 Potential to Achieve Goals: Good ADL Goals Pt Will Perform Grooming: with min guard assist;standing (2 tasks at sink) Pt Will Perform Lower Body Bathing: with min assist;with adaptive equipment;sit to/from stand Pt Will Perform Lower Body Dressing: with min assist;with adaptive equipment;sit to/from stand Pt Will Transfer to Toilet: with min assist;ambulating;bedside commode (over toliet) Pt Will Perform Toileting - Clothing Manipulation and hygiene: with min guard assist;sit to/from stand Additional ADL Goal #1: Pt will be min A in and OOB for BADLs  Visit Information  Last OT Received On: 06/26/13 Assistance Needed: +1 PT/OT/SLP Co-Evaluation/Treatment: Yes PT goals addressed during session: Mobility/safety with mobility;Balance;Proper use of DME;Strengthening/ROM History of Present Illness: INTRAMEDULLARY (IM) RETROGRADE FEMORAL NAILING (Right       Prior [place] expects to be discharged to:: Private residence Living Arrangements: Spouse/significant other Available Help at Discharge: Family;Friend(s);Available PRN/intermittently Type of Home: House Home Access: Stairs to enter Home Layout: Two level;Able to live on main level with bedroom/bathroom Home Equipment: None Prior  Function Level of Independence: Independent Communication Communication: No difficulties Dominant Hand: Right         Vision/Perception Vision - History Patient Visual  Report: No change from baseline   Cognition  Cognition Arousal/Alertness: Awake/alert Behavior During Therapy: WFL for tasks assessed/performed Overall Cognitive Status: Within Functional Limits for tasks assessed    Extremity/Trunk Assessment Upper Extremity Assessment Upper Extremity Assessment: Overall WFL for tasks assessed     Mobility Bed Mobility Bed Mobility: Supine to Sit;Sitting - Scoot to Edge of Bed Supine to Sit: 3: Mod assist;HOB elevated;With rails Sitting - Scoot to Edge of Bed: 3: Mod assist;With rail Details for Bed Mobility Assistance: VCs for sequencing Transfers Transfers: Sit to Stand;Stand to Sit Sit to Stand: 3: Mod assist;From elevated surface;With upper extremity assist;From bed Stand to Sit: 3: Mod assist;With upper extremity assist;With armrests;To chair/3-in-1 Details for Transfer Assistance: VCs for safe hand placement           End of Session OT - End of Session Equipment Utilized During Treatment: Gait belt;Rolling walker Activity Tolerance: Patient tolerated treatment well Patient left: in chair;with call bell/phone within reach Nurse Communication: Mobility status       Almon Register [phone] 06/26/2013, 11:18 AM

## 2013-06-26 NOTE — Progress Notes (Signed)
UR review completed.

## 2013-06-26 NOTE — Care Management Note (Signed)
Per therapist notes patient needs shortterm rehab at snf. Patient undecided. Case Manager will follow.Social worker is aware.

## 2013-06-26 NOTE — Op Note (Signed)
NAMEMarland Kitchen  Marcus [name], Marcus [name] NO.:  [id number]  MEDICAL RECORD NO.:  [id number]  LOCATION:  5N20C                        FACILITY:  [facility]  PHYSICIAN:  Colbie Sliker C. Lorin Mercy, M.D.    DATE OF BIRTH:  September 24, 1945  DATE OF PROCEDURE:  06/25/2013 DATE OF DISCHARGE:                              OPERATIVE REPORT   PREOPERATIVE DIAGNOSIS:  Right femoral shaft fracture.  POSTOPERATIVE DIAGNOSIS:  Right femoral shaft fracture.  PROCEDURE:  Intramedullary right femoral nail, antegrade, VersaNail 440 x 11 mm with proximal distal interlock.  ESTIMATED BLOOD LOSS:  Minimal. SURGEON:   Yoshie Kosel MD  DRAINS:  None.  COMPLICATIONS:  None.  BRIEF HISTORY:  This is a 67 year old male injured in a __________.  He was getting off a tractor and put him in between the arm and the tractor wheel suffering a comminuted distal third femur fracture mostly transverse with a butterfly fragment.  PROCEDURE IN DETAIL:  After induction of general anesthesia, orotracheal intubation, Ancef prophylaxis, prepping draping was performed with well leg holder for the opposite leg, traction was applied, internal rotation slightly with the patella pointing toward the ceiling.  After reduction was obtained prepping with DuraPrep, the usual towels squaring the area, a large shower curtain Betadine Steri-Drape applied.  Time-out procedure completed.  Incision was made proximal to trochanter under fluoroscopic visualization.  Steinmann pin was started with the tip of the nail over reaming, and then progressive reaming up to 12.5 mm for 11-mm nail based on measurements which was inserted until the proximal guide lined up for greater trochanter to lesser trochanter proximal interlock screw.  This penetrated through the lesser trochanter at excellent fixation. Distally, the tip of the nail was at the mid portion of the patella. There was slight valgus at the fracture site, and the F tool was used to help correct  this, improve the alignment, and then the distal interlock screw was placed with a freehand technique.  Lateral guide to the proximal interlock had been removed.  Final spot pictures were taken. Wounds were irrigated, deep layer closed with #1 Vicryl, 2-0 Vicryl subcutaneous tissue, skin staple closure, Marcaine infiltration, postop dressing, and transferred to recovery room.    Hazeline Charnley C. Lorin Mercy, M.D.    MCY/MEDQ  D:  06/25/2013  T:  06/26/2013  Job:  [id number]

## 2013-06-27 ENCOUNTER — Encounter (HOSPITAL_COMMUNITY): Payer: Self-pay | Admitting: Orthopaedic Surgery

## 2013-06-27 ENCOUNTER — Inpatient Hospital Stay (HOSPITAL_COMMUNITY): Payer: Medicare Other

## 2013-06-27 LAB — GLUCOSE, CAPILLARY
Glucose-Capillary: 105 mg/dL — ABNORMAL HIGH (ref 70–99)
Glucose-Capillary: 129 mg/dL — ABNORMAL HIGH (ref 70–99)
Glucose-Capillary: 122 mg/dL — ABNORMAL HIGH (ref 70–99)

## 2013-06-27 MED ORDER — INSULIN GLARGINE 100 UNIT/ML ~~LOC~~ SOLN
15.0000 [IU] | Freq: Every day | SUBCUTANEOUS | Status: DC
  Administered 2013-06-27 – 2013-06-29 (×3): 15 [IU] via SUBCUTANEOUS
  Filled 2013-06-27 (×5): qty 0.15

## 2013-06-27 NOTE — Progress Notes (Signed)
Patient reported that he wants to go with home health. Clinical Social Worker will sign off for now as social work intervention is no longer needed. Please consult [place] again if new need arises.    Rhea Pink, MSW, [place]

## 2013-06-27 NOTE — Progress Notes (Signed)
Subjective: 2 Days Post-Op Procedure(s) (LRB): INTRAMEDULLARY (IM) RETROGRADE FEMORAL NAILING (Right) Patient reports pain as mild.  Pain only with motion.  Objective: Vital signs in last 24 hours: Temp:  [98.7 F (37.1 C)-99.4 F (37.4 C)] 98.7 F (37.1 C) (12/03 0631) Pulse Rate:  [92-99] 96 (12/03 0631) Resp:  [18] 18 (12/03 0631) BP: (101-110)/(53-67) 110/65 mmHg (12/03 0631) SpO2:  [90 %-94 %] 90 % (12/03 0631)  Intake/Output from previous day: 12/02 0701 - 12/03 0700 In: 1320 [P.O.:1320] Out: 350 [Urine:350] Intake/Output this shift:     Recent Labs  06/25/13 0925  HGB 16.4    Recent Labs  06/25/13 0925  WBC 9.5  RBC 5.20  HCT 49.1  PLT 164    Recent Labs  06/25/13 0925  NA 137  K 5.2*  CL 102  CO2 24  BUN 26*  CREATININE 1.16  GLUCOSE 294*  CALCIUM 8.9   No results found for this basename: LABPT, INR,  in the last 72 hours  Neurologically intact  Assessment/Plan: 2 Days Post-Op Procedure(s) (LRB): INTRAMEDULLARY (IM) RETROGRADE FEMORAL NAILING (Right) Up with therapy home in a couple days. CBG still up will increase insulin  Marcus [name] C 06/27/2013, 8:15 AM

## 2013-06-27 NOTE — Progress Notes (Signed)
Physical Therapy Treatment Patient Details Name: Marcus [name] MRN: [id number] DOB: 04/10/1946 Today's Date: 06/27/2013 Time: 3833-3832 PT Time Calculation (min): 24 min  PT Assessment / Plan / Recommendation  History of Present Illness Pt is a 67 y/o male admitted after getting right leg trapped between back hoe and tire. He is now s/p right femoral IM nailing and is WBAT.   PT Comments   Pt. Progressing well with PT.  Improved mobility today.  Follow Up Recommendations  SNF     Does the patient have the potential to tolerate intense rehabilitation     Barriers to Discharge        Equipment Recommendations       Recommendations for Other Services    Frequency Min 5X/week   Progress towards PT Goals Progress towards PT goals: Progressing toward goals  Plan Current plan remains appropriate    Precautions / Restrictions Precautions Precautions: Fall Restrictions Weight Bearing Restrictions: Yes RLE Weight Bearing: Weight bearing as tolerated   Pertinent Vitals/Pain Pt. Denies pain only reports "discomfort" in R hip.    Mobility  Bed Mobility Bed Mobility: Supine to Sit;Sitting - Scoot to Edge of Bed Supine to Sit: 4: Min assist;With rails;HOB elevated Sitting - Scoot to [employer] of Bed: 4: Min assist;With rail Details for Bed Mobility Assistance: VC's for sequencing and technique during transition to EOB. Assist for RLE support and movement.  Transfers Transfers: Sit to Stand;Stand to Sit Sit to Stand: 4: Min assist;From elevated surface;With upper extremity assist;From bed Stand to Sit: 4: Min assist;With armrests;To chair/3-in-1 Details for Transfer Assistance: VCs for hand placement and sequencing to sit. Ambulation/Gait Ambulation/Gait Assistance: 4: Min guard Ambulation Distance (Feet): 15 Feet Assistive device: Rolling walker Ambulation/Gait Assistance Details: overall improved mobility, VCs for sequencing and for UE use Gait Pattern: Step-to  pattern;Decreased stride length;Narrow base of support;Trunk flexed Gait velocity: Decreased Stairs: No Wheelchair Mobility Wheelchair Mobility: No    Exercises General Exercises - Lower Extremity Ankle Circles/Pumps: 10 reps Quad Sets: 10 reps   PT Diagnosis:    PT Problem List:   PT Treatment Interventions:     PT Goals (current goals can now be found in the care plan section) Acute Rehab PT Goals Patient Stated Goal: To return home with wife PT Goal Formulation: With patient Time For Goal Achievement: 07/03/13 Potential to Achieve Goals: Good  Visit Information  Last PT Received On: 06/27/13 Assistance Needed: +1 History of Present Illness: Pt is a 67 y/o male admitted after getting right leg trapped between back hoe and tire. He is now s/p right femoral IM nailing and is WBAT.    Subjective Data  Subjective: I'm as ready as I can be. Patient Stated Goal: To return home with wife   Cognition  Cognition Arousal/Alertness: Awake/alert Behavior During Therapy: WFL for tasks assessed/performed Overall Cognitive Status: Within Functional Limits for tasks assessed    Balance  Balance Balance Assessed: Yes Static Sitting Balance Static Sitting - Balance Support: Feet supported;No upper extremity supported Static Sitting - Level of Assistance: 5: Stand by assistance Static Sitting - Comment/# of Minutes: 5 Static Standing Balance Static Standing - Balance Support: Bilateral upper extremity supported Static Standing - Level of Assistance: 5: Stand by assistance  End of Session PT - End of Session Equipment Utilized During Treatment: Gait belt Activity Tolerance: Patient tolerated treatment well Patient left: in chair;with call bell/phone within reach Nurse Communication: Mobility status   GP     Siri Cole  06/27/2013, 9:51 AM  Laureen Abrahams, PT, DPT [phone]

## 2013-06-28 LAB — GLUCOSE, CAPILLARY: Glucose-Capillary: 122 mg/dL — ABNORMAL HIGH (ref 70–99)

## 2013-06-28 NOTE — Care Management Note (Signed)
Case Manager spoke with patient concerning home health and DME needs at discharge. Choice offered. CM contacted Pearletha Forge with [facility]. Rolling walker, 3in1 and tub chair to be delivered to patients room. Ricki Miller, RN BSN

## 2013-06-28 NOTE — Progress Notes (Signed)
Subjective: 3 Days Post-Op Procedure(s) (LRB): INTRAMEDULLARY (IM) RETROGRADE FEMORAL NAILING (Right) Patient reports pain as mild.    Objective: Vital signs in last 24 hours: Temp:  [98.8 F (37.1 C)-99.2 F (37.3 C)] 98.9 F (37.2 C) (12/04 0621) Pulse Rate:  [93-109] 105 (12/04 0621) Resp:  [16-17] 16 (12/04 0621) BP: (127-141)/(72-82) 128/82 mmHg (12/04 0621) SpO2:  [92 %-97 %] 94 % (12/04 0621)  Intake/Output from previous day: 12/03 0701 - 12/04 0700 In: 980 [P.O.:960; I.V.:20] Out: 1650 [Urine:1650] Intake/Output this shift:     Recent Labs  06/25/13 0925  HGB 16.4    Recent Labs  06/25/13 0925  WBC 9.5  RBC 5.20  HCT 49.1  PLT 164    Recent Labs  06/25/13 0925  NA 137  K 5.2*  CL 102  CO2 24  BUN 26*  CREATININE 1.16  GLUCOSE 294*  CALCIUM 8.9   No results found for this basename: LABPT, INR,  in the last 72 hours  Neurologically intact  Assessment/Plan: 3 Days Post-Op Procedure(s) (LRB): INTRAMEDULLARY (IM) RETROGRADE FEMORAL NAILING (Right) Discharge home with home health likely home Friday  Marcus [name] C 06/28/2013, 7:36 AM

## 2013-06-28 NOTE — Progress Notes (Signed)
Physical Therapy Treatment Patient Details Name: Marcus [name] MRN: [id number] DOB: 12-23-1945 Today's Date: 06/28/2013 Time: 1150-1210 PT Time Calculation (min): 20 min  PT Assessment / Plan / Recommendation  History of Present Illness Pt is a 67 y/o male admitted after getting right leg trapped between back hoe and tire. He is now s/p right femoral IM nailing and is WBAT.   PT Comments   Pt progressing towards physical therapy goals. Pt reports that he does not wish to go to SNF at d/c, and would rather return home. Pt has shown improvement with safety and independence during PT sessions, and anticipate home with HHPT to follow as an appropriate d/c disposition.   Follow Up Recommendations  Home health PT     Does the patient have the potential to tolerate intense rehabilitation     Barriers to Discharge        Equipment Recommendations       Recommendations for Other Services    Frequency Min 5X/week   Progress towards PT Goals Progress towards PT goals: Progressing toward goals  Plan Discharge plan needs to be updated    Precautions / Restrictions Precautions Precautions: Fall Restrictions Weight Bearing Restrictions: Yes RLE Weight Bearing: Weight bearing as tolerated   Pertinent Vitals/Pain Pt reports 3/10 during ambulation    Mobility  Bed Mobility Bed Mobility: Not assessed Details for Bed Mobility Assistance: Pt received up in chair Transfers Transfers: Sit to Stand;Stand to Sit Sit to Stand: 4: Min guard;From chair/3-in-1;With upper extremity assist Stand to Sit: 4: Min guard;To chair/3-in-1;With upper extremity assist Details for Transfer Assistance: VC's for hand placement and safety awareness. Increased time to come to full sit but no physical assist needed. Ambulation/Gait Ambulation/Gait Assistance: 4: Min guard Ambulation Distance (Feet): 100 Feet Assistive device: Rolling walker Ambulation/Gait Assistance Details: VC's for sequencing and  safety awareness with RW. Pt was able to progress to step-through gait pattern but reverted back to step-to as he fatigued. Gait Pattern: Step-to pattern;Step-through pattern;Decreased stride length;Trunk flexed Gait velocity: Decreased Stairs: No    Exercises General Exercises - Lower Extremity Ankle Circles/Pumps: 10 reps Quad Sets: 10 reps Long Arc Quad: 10 reps Heel Slides: 10 reps Hip ABduction/ADduction: 10 reps   PT Diagnosis:    PT Problem List:   PT Treatment Interventions:     PT Goals (current goals can now be found in the care plan section) Acute Rehab PT Goals Patient Stated Goal: To return home with wife PT Goal Formulation: With patient Time For Goal Achievement: 07/03/13 Potential to Achieve Goals: Good  Visit Information  Last PT Received On: 06/28/13 Assistance Needed: +1 History of Present Illness: Pt is a 67 y/o male admitted after getting right leg trapped between back hoe and tire. He is now s/p right femoral IM nailing and is WBAT.    Subjective Data  Subjective: "Yeah I guess now is okay." Patient Stated Goal: To return home with wife   Cognition  Cognition Arousal/Alertness: Awake/alert Behavior During Therapy: WFL for tasks assessed/performed Overall Cognitive Status: Within Functional Limits for tasks assessed    Balance     End of Session PT - End of Session Equipment Utilized During Treatment: Gait belt Activity Tolerance: Patient tolerated treatment well Patient left: in chair;with call bell/phone within reach Nurse Communication: Mobility status   GP     Jolyn Lent 06/28/2013, 12:19 PM  Jolyn Lent, [place], DPT [phone]

## 2013-06-28 NOTE — Progress Notes (Signed)
Physical Therapy Treatment Patient Details Name: Marcus [name] MRN: [id number] DOB: [birth year] Today's Date: 06/28/2013 Time: 6644-0347 PT Time Calculation (min): 18 min  PT Assessment / Plan / Recommendation  History of Present Illness Pt is a 67 y/o male admitted after getting right leg trapped between back hoe and tire. He is now s/p right femoral IM nailing and is WBAT.   PT Comments   Pt continues to progress towards physical therapy goals. He was fatigued this afternoon from earlier session, however demonstrated a good rehab effort during gait training and therapeutic exercise. He reports that his wife is having a ramp put in before he comes home - normally has 1 step onto a porch prior to entering home.   Follow Up Recommendations  Home health PT     Does the patient have the potential to tolerate intense rehabilitation     Barriers to Discharge        Equipment Recommendations  3in1 (PT);Rolling walker with 5" wheels    Recommendations for Other Services    Frequency Min 5X/week   Progress towards PT Goals Progress towards PT goals: Progressing toward goals  Plan Current plan remains appropriate    Precautions / Restrictions Precautions Precautions: Fall Restrictions Weight Bearing Restrictions: Yes RLE Weight Bearing: Weight bearing as tolerated   Pertinent Vitals/Pain 5/10 after ambulation    Mobility  Bed Mobility Bed Mobility: Not assessed Details for Bed Mobility Assistance: Pt received up in chair Transfers Transfers: Sit to Stand;Stand to Sit Sit to Stand: 4: Min guard;From chair/3-in-1;With upper extremity assist Stand to Sit: 4: Min guard;To chair/3-in-1;With upper extremity assist Details for Transfer Assistance: VC's for hand placement and safety awareness. Increased time to come to full sit but no physical assist needed. Ambulation/Gait Ambulation/Gait Assistance: 4: Min guard Ambulation Distance (Feet): 100 Feet Assistive device: Rolling  walker Ambulation/Gait Assistance Details: VC's for improved posture and encouraged step-through gait pattern.  Gait Pattern: Step-to pattern;Step-through pattern;Decreased stride length;Trunk flexed Gait velocity: Decreased Stairs: No    Exercises General Exercises - Lower Extremity Ankle Circles/Pumps: 10 reps Quad Sets: 10 reps Long Arc Quad: 10 reps Heel Slides: 10 reps Hip ABduction/ADduction: 15 reps   PT Diagnosis:    PT Problem List:   PT Treatment Interventions:     PT Goals (current goals can now be found in the care plan section) Acute Rehab PT Goals Patient Stated Goal: To return home with wife PT Goal Formulation: With patient Time For Goal Achievement: 07/03/13 Potential to Achieve Goals: Good  Visit Information  Last PT Received On: 06/28/13 Assistance Needed: +1 History of Present Illness: Pt is a 67 y/o male admitted after getting right leg trapped between back hoe and tire. He is now s/p right femoral IM nailing and is WBAT.    Subjective Data  Subjective: "I'm as ready as I'm going to be." Patient Stated Goal: To return home with wife   Cognition  Cognition Arousal/Alertness: Awake/alert Behavior During Therapy: WFL for tasks assessed/performed Overall Cognitive Status: Within Functional Limits for tasks assessed    Balance     End of Session PT - End of Session Equipment Utilized During Treatment: Gait belt Activity Tolerance: Patient tolerated treatment well Patient left: in chair;with call bell/phone within reach Nurse Communication: Mobility status   GP     Jolyn Lent 06/28/2013, 4:08 PM  Jolyn Lent, [place], DPT [phone]

## 2013-06-28 NOTE — Progress Notes (Signed)
Occupational Therapy Treatment Patient Details Name: Marcus [name] MRN: [id number] DOB: 08-14-1945 Today's Date: 06/28/2013 Time: 8185-6314 OT Time Calculation (min): 31 min  OT Assessment / Plan / Recommendation  History of present illness Pt is a 67 y/o male admitted after getting right leg trapped between back hoe and tire. He is now s/p right femoral IM nailing and is WBAT.   OT comments  Pt now planning to d/c home (not SNF), progressing towards acute OT goals. Assess tub & toilet transfers next visit, review LB dressing w/ a/e.   Follow Up Recommendations  Home health OT;Supervision/Assistance - 24 hour (Pt reports that he is now planning to d/c home)    Barriers to Discharge       Equipment Recommendations  3 in 1 bedside comode;Other (comment) (?tub bench)    Recommendations for Other Services    Frequency Min 2X/week   Progress towards OT Goals Progress towards OT goals: Progressing toward goals  Plan Discharge plan needs to be updated    Precautions / Restrictions Precautions Precautions: Fall Restrictions Weight Bearing Restrictions: Yes RLE Weight Bearing: Weight bearing as tolerated   Pertinent Vitals/Pain No c/o    ADL  Grooming: Performed;Wash/dry hands;Wash/dry face;Supervision/safety Where Assessed - Grooming: Supported standing;Unsupported standing Lower Body Dressing: Performed;Supervision/safety;Min guard (w/ a/e, min guard donning socks, supervision doffing socks) Where Assessed - Lower Body Dressing: Supported sit to stand Toilet Transfer: Performed;Minimal assistance Toilet Transfer Method: Sit to Loss adjuster, chartered: Comfort height toilet Toileting - Clothing Manipulation and Hygiene: Performed;Minimal assistance Tub/Shower Transfer: Other (comment) (Pt education in tub transfers/handout for DME issued/reviewed) Equipment Used: Gait belt;Rolling walker Transfers/Ambulation Related to ADLs: Min assist sit to stand from chair,  comfort height toilet vc's for hand placement w/ RW ADL Comments: Pt was educated in DME secondary to current plans to d/c home w/ HHOT/PT and PRN asssist from his wife. Pt performed LB dressing using a/e, states he has [place] reacher & ?3:1 at home. Pt will need 3:1 and may need tub bench if going home. Assess tub transfers, toilet transfer next visit.    OT Diagnosis:    OT Problem List:   OT Treatment Interventions:     OT Goals(current goals can now be found in the care plan section) Acute Rehab OT Goals Patient Stated Goal: To return home with wife OT Goal Formulation: With patient Time For Goal Achievement: 07/10/13 Potential to Achieve Goals: Good  Visit Information  Last OT Received On: 06/28/13 Assistance Needed: +1 History of Present Illness: Pt is a 67 y/o male admitted after getting right leg trapped between back hoe and tire. He is now s/p right femoral IM nailing and is WBAT.    Subjective Data      Prior Functioning       Cognition  Cognition Arousal/Alertness: Awake/alert Behavior During Therapy: WFL for tasks assessed/performed Overall Cognitive Status: Within Functional Limits for tasks assessed    Mobility  Bed Mobility Bed Mobility: Not assessed (Pt up in chair ) Transfers Transfers: Sit to Stand;Stand to Sit Sit to Stand: 4: Min assist;From elevated surface;With upper extremity assist;From chair/3-in-1;From toilet Stand to Sit: 4: Min assist;With armrests;To chair/3-in-1;To toilet Details for Transfer Assistance: VCs for hand placement and sequencing to sit.            End of Session OT - End of Session Equipment Utilized During Treatment: Gait belt;Rolling walker Activity Tolerance: Patient tolerated treatment well Patient left: in chair;with call bell/phone within reach  GO  Marcus [name] 06/28/2013, 10:38 AM

## 2013-06-29 LAB — GLUCOSE, CAPILLARY
Glucose-Capillary: 147 mg/dL — ABNORMAL HIGH (ref 70–99)
Glucose-Capillary: 142 mg/dL — ABNORMAL HIGH (ref 70–99)

## 2013-06-29 MED ORDER — OXYCODONE-ACETAMINOPHEN 5-325 MG PO TABS: 1.0000 | ORAL_TABLET | ORAL | Status: DC | PRN

## 2013-06-29 MED ORDER — METHOCARBAMOL 500 MG PO TABS: 500.0000 mg | ORAL_TABLET | Freq: Three times a day (TID) | ORAL | Status: DC | PRN

## 2013-06-29 MED ORDER — BISACODYL 10 MG RE SUPP: 10.0000 mg | Freq: Once | RECTAL | Status: DC

## 2013-06-29 MED ORDER — ASPIRIN EC 325 MG PO TBEC: 325.0000 mg | DELAYED_RELEASE_TABLET | Freq: Every day | ORAL | Status: DC

## 2013-06-29 MED ORDER — DSS 100 MG PO CAPS: 100.0000 mg | ORAL_CAPSULE | Freq: Two times a day (BID) | ORAL | Status: DC

## 2013-06-29 NOTE — Progress Notes (Signed)
Occupational Therapy Treatment Patient Details Name: Marcus [name] MRN: [id number] DOB: 1946-07-22 Today's Date: 06/29/2013 Time: 3299-2426 OT Time Calculation (min): 11 min  OT Assessment / Plan / Recommendation  History of present illness Pt is a 67 y/o male admitted after getting right leg trapped between back hoe and tire. He is now s/p right femoral IM nailing and is WBAT.   OT comments  Pt educated on use of ADL A/E for use at home. Discussed use of tub bench and 3 in 1 for home. DME has been delivered to pt's room (RW and 3 in 1)Pt should continue with acute OT services to address impairments to increase level of function and safety to return home. Pt will now be return home and wife will assist him  Follow Up Recommendations  Home health OT;Supervision/Assistance - 24 hour    Barriers to Discharge   none    Equipment Recommendations  3 in 1 bedside comode;Other (comment) (tub bench, A/E)    Recommendations for Other Services    Frequency Min 2X/week   Progress towards OT Goals Progress towards OT goals: Progressing toward goals  Plan Discharge plan needs to be updated    Precautions / Restrictions Precautions Precautions: Fall Restrictions Weight Bearing Restrictions: No RLE Weight Bearing: Weight bearing as tolerated   Pertinent Vitals/Pain 5/10    ADL  Transfers/Ambulation Related to ADLs: pt declined OOB due to fatigue, but agreeable to A/E education and review ADL Comments: pt educated on use of LH shoe gorn, LH bath sponge and sock aid for hoe use. Pt states that he has a LH reacher and 3 in 1 at home    OT Diagnosis:    OT Problem List:   OT Treatment Interventions:     OT Goals(current goals can now be found in the care plan section)    Visit Information  Last OT Received On: 06/29/13 Assistance Needed: +1 History of Present Illness: Pt is a 67 y/o male admitted after getting right leg trapped between back hoe and tire. He is now s/p right  femoral IM nailing and is WBAT.    Subjective Data      Prior Functioning       Cognition  Cognition Arousal/Alertness: Awake/alert Behavior During Therapy: WFL for tasks assessed/performed Overall Cognitive Status: Within Functional Limits for tasks assessed    Mobility  Bed Mobility Bed Mobility: Not assessed Sit to Supine: 4: Min guard Details for Bed Mobility Assistance: pt declined OOB due to fatigue Transfers Sit to Stand: 5: Supervision;From chair/3-in-1;From toilet Stand to Sit: 5: Supervision;To toilet;To bed Details for Transfer Assistance: pt declined OOB due to fatigue    Exercises      Balance Balance Balance Assessed: No   End of Session OT - End of Session Activity Tolerance: Patient limited by fatigue  GO     Marcus [name] 06/29/2013, 2:24 PM

## 2013-06-29 NOTE — Progress Notes (Signed)
Physical Therapy Treatment Patient Details Name: Marcus [name] MRN: [id number] DOB: 12/15/1945 Today's Date: 06/29/2013 Time: 9924-2683 PT Time Calculation (min): 25 min  PT Assessment / Plan / Recommendation  History of Present Illness Pt is a 67 y/o male admitted after getting right leg trapped between back hoe and tire. He is now s/p right femoral IM nailing and is WBAT.   PT Comments   POD # 4 R IM nail.  Pt was planning to D/C today but then spouse had a medical condition and needed to see a MD today.  Assisted pt out of recliner to BR then amb in hallway.  Assisted pt to bed to perform R LE TE's.  Instructed on freq and use of ICE after.   Follow Up Recommendations  Home health PT     Does the patient have the potential to tolerate intense rehabilitation     Barriers to Discharge        Equipment Recommendations  3in1 (PT);Rolling walker with 5" wheels    Recommendations for Other Services    Frequency Min 5X/week   Progress towards PT Goals Progress towards PT goals: Progressing toward goals  Plan      Precautions / Restrictions Precautions Precautions: Fall Restrictions Weight Bearing Restrictions: No RLE Weight Bearing: Weight bearing as tolerated    Pertinent Vitals/Pain No c/o pain "knee tightness"     Mobility  Bed Mobility Bed Mobility: Sit to Supine Sit to Supine: 4: Min guard Details for Bed Mobility Assistance: instructed pt on how to use a belt to assist R LE up onto bed which pt performed at [facility] Assist Transfers Transfers: Sit to Stand;Stand to Sit Sit to Stand: 5: Supervision;From chair/3-in-1;From toilet Stand to Sit: 5: Supervision;To toilet;To bed Details for Transfer Assistance: increased time and one VC on safety Ambulation/Gait Ambulation/Gait Assistance: 5: Supervision Ambulation Distance (Feet): 115 Feet Assistive device: Rolling walker Ambulation/Gait Assistance Details: increased time and <25% VC's safety with turns  and proper walker to self distance. Gait Pattern: Step-to pattern;Step-through pattern;Decreased stride length;Trunk flexed Gait velocity: Decreased    Exercises  R LE  TE's 10 reps ankle pumps 10 reps knee presses 10 reps heel slides AAROM 10 reps SLR AAROM 10 reps SAQ's 10 reps ABD 10 reps LAQ sitting Followed by ICE     PT Goals (current goals can now be found in the care plan section)    Visit Information  Last PT Received On: 06/29/13 Assistance Needed: +1 History of Present Illness: Pt is a 67 y/o male admitted after getting right leg trapped between back hoe and tire. He is now s/p right femoral IM nailing and is WBAT.    Subjective Data      Cognition       Balance     End of Session PT - End of Session Equipment Utilized During Treatment: Gait belt Activity Tolerance: Patient tolerated treatment well Patient left: in bed;with call bell/phone within reach   Rica Koyanagi  PTA [facility]  Acute  Rehab Pager      [phone]

## 2013-06-29 NOTE — Progress Notes (Signed)
Patient ID: Marcus [name], male   DOB: 05-Nov-1945, 67 y.o.   MRN: [id number] Plan  Discharge   Saturday. Patient says his wife is having vertigo and is trying to see a doctor today.

## 2013-06-30 NOTE — Progress Notes (Signed)
Rn reviewed discharge instructions with patient and family. All questions answered.   Patient given discharge instructions and prescriptions.   Patient wheeled down with NT.

## 2013-06-30 NOTE — Discharge Summary (Signed)
  Discharge to home in stable condition. Final diagnosis right femur fracture. Treatment internal fixation right femur. Followup in the office in 2 weeks

## 2013-06-30 NOTE — Progress Notes (Signed)
Occupational Therapy Treatment Patient Details Name: Braeson Rupe MRN: [id number] DOB: May 14, 1946 Today's Date: 06/30/2013 Time: 1856-3149 OT Time Calculation (min): 30 min  OT Assessment / Plan / Recommendation  History of present illness Pt is a 67 y/o male admitted after getting right leg trapped between back hoe and tire. He is now s/p right femoral IM nailing and is WBAT.   OT comments  Pt is supposed to d/c today. Reviewed all DME and AE and how to use all. Discussed how to adjust 3in1 for appropriate height and pt states he didn't feel he needed to practice with 3in1 this am. Has family assist at d/c available.   Follow Up Recommendations  Home health OT;Supervision/Assistance - 24 hour    Barriers to Discharge       Equipment Recommendations  3 in 1 bedside comode (planning to borrow tubbench and family to obtain AE)    Recommendations for Other Services    Frequency Min 2X/week   Progress towards OT Goals Progress towards OT goals: Progressing toward goals  Plan Discharge plan remains appropriate    Precautions / Restrictions Precautions Precautions: Fall Restrictions Weight Bearing Restrictions: No RLE Weight Bearing: Weight bearing as tolerated   Pertinent Vitals/Pain No complaint of apin    ADL  Lower Body Dressing: Performed;Set up;Supervision/safety ( with reacher to doff sock and sock aid to don) Where Assessed - Lower Body Dressing: Unsupported sitting Equipment Used: Long-handled shoe horn;Long-handled sponge;Reacher;Rolling walker ADL Comments: Educated pt and family member on where to obtain AE for LB self care. Pt practiced with reacher and sock aid,. Reviewed the use of other AE. Pt already dressed this am. Took pt down to tub room in recliner and demonstrated tub transfer with bench and pt verbalized understanding,. He declined the need to practice transfer and states everything makes sense on how to use. Pt states his mother in law has a  [place] he can borrow. issued DME handout and explained tubbench versus seat options and coverage just in case he cant obrain family's seat. Family member present plans to obtain AE kit today.    OT Diagnosis:    OT Problem List:   OT Treatment Interventions:     OT Goals(current goals can now be found in the care plan section)    Visit Information  Last OT Received On: 06/30/13 Assistance Needed: +1 History of Present Illness: Pt is a 67 y/o male admitted after getting right leg trapped between back hoe and tire. He is now s/p right femoral IM nailing and is WBAT.    Subjective Data      Prior Functioning       Cognition  Cognition Arousal/Alertness: Awake/alert Behavior During Therapy: WFL for tasks assessed/performed Overall Cognitive Status: Within Functional Limits for tasks assessed       Exercises      Balance     End of Session OT - End of Session Activity Tolerance: Patient tolerated treatment well Patient left: in chair;with call bell/phone within reach;with family/visitor present  [place], [place] [phone] 06/30/2013, 10:04 AM

## 2013-06-30 NOTE — Progress Notes (Signed)
Physical Therapy Treatment Patient Details Name: Marcus [name] MRN: [id number] DOB: Sep 15, 1945 Today's Date: 06/30/2013 Time: 8270-7867 PT Time Calculation (min): 25 min  PT Assessment / Plan / Recommendation  History of Present Illness Pt is a 67 y/o male admitted after getting right leg trapped between back hoe and tire. He is now s/p right femoral IM nailing and is WBAT.   PT Comments   Pt moving well.  Eager to d/c home.    Follow Up Recommendations  Home health PT     Does the patient have the potential to tolerate intense rehabilitation     Barriers to Discharge        Equipment Recommendations  3in1 (PT);Rolling walker with 5" wheels    Recommendations for Other Services    Frequency Min 5X/week   Progress towards PT Goals Progress towards PT goals: Progressing toward goals  Plan Current plan remains appropriate    Precautions / Restrictions Precautions Precautions: Fall Restrictions Weight Bearing Restrictions: Yes RLE Weight Bearing: Weight bearing as tolerated       Mobility  Bed Mobility Bed Mobility: Not assessed Transfers Transfers: Sit to Stand;Stand to Sit Sit to Stand: With upper extremity assist;With armrests;From chair/3-in-1;6: Modified independent (Device/Increase time);From toilet Stand to Sit: 6: Modified independent (Device/Increase time);With upper extremity assist;With armrests;To chair/3-in-1;To toilet Ambulation/Gait Ambulation/Gait Assistance: 5: Supervision Ambulation Distance (Feet): 100 Feet Assistive device: Rolling walker Ambulation/Gait Assistance Details: Pt demonstrates safe use of walker.  Encouragement to increase RLE WBing Gait Pattern: Step-through pattern;Antalgic General Gait Details: cues for increased heel strike & terminal knee extension RLE.   Stairs: No Wheelchair Mobility Wheelchair Mobility: No    Exercises General Exercises - Lower Extremity Ankle Circles/Pumps: AROM;Both;10 reps Quad Sets:  AROM;Strengthening;Both;10 reps Long Arc Quad: AROM;Strengthening;Right;10 reps Straight Leg Raises: AROM;Strengthening;Right;10 reps Hip Flexion/Marching: AROM;Strengthening;Right;10 reps;Seated     PT Goals (current goals can now be found in the care plan section) Acute Rehab PT Goals PT Goal Formulation: With patient Time For Goal Achievement: 07/03/13 Potential to Achieve Goals: Good  Visit Information  Last PT Received On: 06/30/13 Assistance Needed: +1 History of Present Illness: Pt is a 67 y/o male admitted after getting right leg trapped between back hoe and tire. He is now s/p right femoral IM nailing and is WBAT.    Subjective Data      Cognition  Cognition Arousal/Alertness: Awake/alert Behavior During Therapy: WFL for tasks assessed/performed Overall Cognitive Status: Within Functional Limits for tasks assessed    Balance     End of Session PT - End of Session Activity Tolerance: Patient tolerated treatment well Patient left: in chair;with call bell/phone within reach Nurse Communication: Mobility status   GP     Sena Hitch 06/30/2013, 10:08 AM  Sarajane Marek, PTA [phone] 06/30/2013

## 2013-07-21 ENCOUNTER — Encounter (HOSPITAL_COMMUNITY): Payer: Self-pay | Admitting: Emergency Medicine

## 2013-07-21 ENCOUNTER — Inpatient Hospital Stay (HOSPITAL_COMMUNITY)
Admission: EM | Admit: 2013-07-21 | Discharge: 2013-07-26 | DRG: 096 | Disposition: A | Discharge status: Home / Self Care | Payer: Medicare Other | Attending: Internal Medicine | Admitting: Internal Medicine

## 2013-07-21 ENCOUNTER — Inpatient Hospital Stay (HOSPITAL_COMMUNITY): Payer: Medicare Other

## 2013-07-21 ENCOUNTER — Emergency Department (HOSPITAL_COMMUNITY): Payer: Medicare Other

## 2013-07-21 DIAGNOSIS — R29898 Other symptoms and signs involving the musculoskeletal system: Secondary | ICD-10-CM | POA: Diagnosis present

## 2013-07-21 DIAGNOSIS — E119 Type 2 diabetes mellitus without complications: Secondary | ICD-10-CM | POA: Diagnosis present

## 2013-07-21 DIAGNOSIS — R5381 Other malaise: Secondary | ICD-10-CM

## 2013-07-21 DIAGNOSIS — R209 Unspecified disturbances of skin sensation: Secondary | ICD-10-CM

## 2013-07-21 DIAGNOSIS — R200 Anesthesia of skin: Secondary | ICD-10-CM

## 2013-07-21 DIAGNOSIS — R29818 Other symptoms and signs involving the nervous system: Secondary | ICD-10-CM | POA: Insufficient documentation

## 2013-07-21 DIAGNOSIS — R531 Weakness: Secondary | ICD-10-CM

## 2013-07-21 DIAGNOSIS — I4892 Unspecified atrial flutter: Secondary | ICD-10-CM

## 2013-07-21 DIAGNOSIS — M109 Gout, unspecified: Secondary | ICD-10-CM

## 2013-07-21 DIAGNOSIS — G610 Guillain-Barre syndrome: Principal | ICD-10-CM | POA: Diagnosis present

## 2013-07-21 DIAGNOSIS — I4891 Unspecified atrial fibrillation: Secondary | ICD-10-CM | POA: Diagnosis present

## 2013-07-21 LAB — CBC
Hemoglobin: 15.4 g/dL (ref 13.0–17.0)
MCH: 32.5 pg (ref 26.0–34.0)
MCHC: 33.6 g/dL (ref 30.0–36.0)
WBC: 8.6 10*3/uL (ref 4.0–10.5)

## 2013-07-21 LAB — BASIC METABOLIC PANEL
Calcium: 9.1 mg/dL (ref 8.4–10.5)
Chloride: 103 mEq/L (ref 96–112)
GFR calc non Af Amer: 59 mL/min — ABNORMAL LOW (ref >90)
Glucose, Bld: 103 mg/dL — ABNORMAL HIGH (ref 70–99)
Sodium: 140 mEq/L (ref 135–145)

## 2013-07-21 LAB — CSF CELL COUNT WITH DIFFERENTIAL
Tube #: 1
WBC, CSF: 2 /mm3 (ref 0–5)

## 2013-07-21 LAB — PROTEIN AND GLUCOSE, CSF
Glucose, CSF: 69 mg/dL (ref 43–76)
Total  Protein, CSF: 47 mg/dL — ABNORMAL HIGH (ref 15–45)

## 2013-07-21 LAB — APTT: aPTT: 28 seconds (ref 24–37)

## 2013-07-21 LAB — URINALYSIS, ROUTINE W REFLEX MICROSCOPIC
Bilirubin Urine: NEGATIVE
Glucose, UA: NEGATIVE mg/dL
Hgb urine dipstick: NEGATIVE
Protein, ur: NEGATIVE mg/dL
Urobilinogen, UA: 1 mg/dL (ref 0.0–1.0)
pH: 5 (ref 5.0–8.0)

## 2013-07-21 LAB — PROTIME-INR: Prothrombin Time: 14.7 seconds (ref 11.6–15.2)

## 2013-07-21 LAB — GLUCOSE, CAPILLARY: Glucose-Capillary: 98 mg/dL (ref 70–99)

## 2013-07-21 MED ORDER — METOPROLOL TARTRATE 50 MG PO TABS
75.0000 mg | ORAL_TABLET | Freq: Two times a day (BID) | ORAL | Status: DC
  Administered 2013-07-21 – 2013-07-23 (×5): 75 mg via ORAL
  Filled 2013-07-21 (×7): qty 1

## 2013-07-21 MED ORDER — ONDANSETRON HCL 4 MG PO TABS: 4.0000 mg | ORAL_TABLET | Freq: Four times a day (QID) | ORAL | Status: DC | PRN

## 2013-07-21 MED ORDER — ACETAMINOPHEN 650 MG RE SUPP: 650.0000 mg | Freq: Four times a day (QID) | RECTAL | Status: DC | PRN

## 2013-07-21 MED ORDER — SODIUM CHLORIDE 0.9 % IV SOLN
INTRAVENOUS | Status: DC
  Administered 2013-07-21 – 2013-07-23 (×4): via INTRAVENOUS

## 2013-07-21 MED ORDER — DOCUSATE SODIUM 100 MG PO CAPS
100.0000 mg | ORAL_CAPSULE | Freq: Two times a day (BID) | ORAL | Status: DC
  Administered 2013-07-21 – 2013-07-26 (×9): 100 mg via ORAL
  Filled 2013-07-21 (×8): qty 1

## 2013-07-21 MED ORDER — ACETAMINOPHEN 325 MG PO TABS: 650.0000 mg | ORAL_TABLET | Freq: Four times a day (QID) | ORAL | Status: DC | PRN

## 2013-07-21 MED ORDER — ONDANSETRON HCL 4 MG/2ML IJ SOLN: 4.0000 mg | Freq: Four times a day (QID) | INTRAMUSCULAR | Status: DC | PRN

## 2013-07-21 MED ORDER — ALLOPURINOL 300 MG PO TABS
300.0000 mg | ORAL_TABLET | Freq: Every day | ORAL | Status: DC
  Administered 2013-07-22 – 2013-07-26 (×5): 300 mg via ORAL
  Filled 2013-07-21 (×5): qty 1

## 2013-07-21 MED ORDER — IMMUNE GLOBULIN (HUMAN) 5 GM/100ML IV SOLN
400.0000 mg/kg | INTRAVENOUS | Status: AC
  Administered 2013-07-21 – 2013-07-25 (×5): 35 g via INTRAVENOUS
  Filled 2013-07-21 (×6): qty 700

## 2013-07-21 MED ORDER — INSULIN ASPART 100 UNIT/ML ~~LOC~~ SOLN
0.0000 [IU] | Freq: Three times a day (TID) | SUBCUTANEOUS | Status: DC
  Administered 2013-07-22: 2 [IU] via SUBCUTANEOUS
  Administered 2013-07-22: 1 [IU] via SUBCUTANEOUS

## 2013-07-21 MED ORDER — SODIUM CHLORIDE 0.9 % IJ SOLN
3.0000 mL | Freq: Two times a day (BID) | INTRAMUSCULAR | Status: DC
  Administered 2013-07-21 – 2013-07-26 (×5): 3 mL via INTRAVENOUS

## 2013-07-21 MED ORDER — ASPIRIN EC 325 MG PO TBEC
325.0000 mg | DELAYED_RELEASE_TABLET | Freq: Every day | ORAL | Status: DC
  Administered 2013-07-22 – 2013-07-26 (×5): 325 mg via ORAL
  Filled 2013-07-21 (×5): qty 1

## 2013-07-21 MED ORDER — ENOXAPARIN SODIUM 40 MG/0.4ML ~~LOC~~ SOLN
40.0000 mg | SUBCUTANEOUS | Status: DC
  Administered 2013-07-22 – 2013-07-25 (×4): 40 mg via SUBCUTANEOUS
  Filled 2013-07-21 (×5): qty 0.4

## 2013-07-21 NOTE — ED Notes (Signed)
Per EMS sts pt fell yesterday because of weakness. sts 4 days ago began having numbness and tingling in both feet, hands. sts very cold to the touch but pulses intact. No sensation on the bottom of feet. Pt unable to ambulate without assistance. Stroke scale negative.

## 2013-07-21 NOTE — H&P (Signed)
[facility] History and Physical  Chasin Findling HBZ:[id number] DOB: 1945/09/21 DOA: 07/21/2013  Referring physician:  PCP: Deloria Lair, MD  Specialists:   Chief Complaint: leg numbness, weakness   HPI: Marcus [name] is a 67 y.o. male with PAF, h/o DM presented with bilateral progressive ascending paresthesias that began on the bottoms of his feet and ascended to the midcalf. He has become progressively weaker of his lower extremities and is currently unable to walk without assistance; denies other focal neuro symptoms, no fever, chills, no recent viral illness; no chest pain, no SOB, no cough, no headaches    Review of Systems: The patient denies anorexia, fever, weight loss,, vision loss, decreased hearing, hoarseness, chest pain, syncope, dyspnea on exertion, peripheral edema, balance deficits, hemoptysis, abdominal pain, melena, hematochezia, severe indigestion/heartburn, hematuria, incontinence, genital sores,  suspicious skin lesions, transient blindness, difficulty walking, depression, unusual weight change, abnormal bleeding, enlarged lymph nodes, angioedema, and breast masses.   Past Medical History  Diagnosis Date  . Atrial flutter   . AF (atrial fibrillation)   . Gout    Past Surgical History  Procedure Laterality Date  . Catheter ablation  12/23/2004  . Femur im nail Right 06/25/2013    Procedure: INTRAMEDULLARY (IM) RETROGRADE FEMORAL NAILING;  Surgeon: Marybelle Killings, MD;  Location: [place];  Service: Orthopedics;  Laterality: Right;   Social History:  reports that he has never smoked. He does not have any smokeless tobacco history on file. He reports that he does not drink alcohol or use illicit drugs. Home;  where does patient live--home, ALF, SNF? and with whom if at home? No;  Can patient participate in ADLs?  No Known Allergies  History reviewed. No pertinent family history. no h/o CAD (be sure to complete)  Prior to Admission medications    Medication Sig Start Date End Date Taking? Authorizing Provider  allopurinol (ZYLOPRIM) 300 MG tablet Take 300 mg by mouth daily.   Yes Historical Provider, MD  aspirin EC 325 MG tablet Take 1 tablet (325 mg total) by mouth daily. 06/29/13  Yes Epimenio Foot, PA-C  docusate sodium 100 MG CAPS Take 100 mg by mouth 2 (two) times daily. 06/29/13  Yes Epimenio Foot, PA-C  metoprolol (LOPRESSOR) 50 MG tablet Take 75 mg by mouth 2 (two) times daily.   Yes Historical Provider, MD   Physical Exam: Filed Vitals:   07/21/13 1747  BP: 132/80  Pulse: 102  Temp: 98.4 F (36.9 C)  Resp: 16     General:  alert  Eyes: eom-i, perrla   ENT: no oral ulcers   Neck: supple   Cardiovascular: s1,s2 irregular   Respiratory: CTA BL  Abdomen: soft, nt, nd   Skin: some ulcers in l;egs   Musculoskeletal: no LE edema  Psychiatric: no hallucination   Neurologic: CN 2-12 intact; sensation decreased below knee BL, reflexes absent in LE  Labs on Admission:  Basic Metabolic Panel:  Recent Labs Lab 07/21/13 1622  NA 140  K 4.2  CL 103  CO2 25  GLUCOSE 103*  BUN 32*  CREATININE 1.21  CALCIUM 9.1   Liver Function Tests: No results found for this basename: AST, ALT, ALKPHOS, BILITOT, PROT, ALBUMIN,  in the last 168 hours No results found for this basename: LIPASE, AMYLASE,  in the last 168 hours No results found for this basename: AMMONIA,  in the last 168 hours CBC:  Recent Labs Lab 07/21/13 1622  WBC 8.6  HGB  15.4  HCT 45.9  MCV 96.8  PLT 255   Cardiac Enzymes: No results found for this basename: CKTOTAL, CKMB, CKMBINDEX, TROPONINI,  in the last 168 hours  BNP (last 3 results) No results found for this basename: PROBNP,  in the last 8760 hours CBG: No results found for this basename: GLUCAP,  in the last 168 hours  Radiological Exams on Admission: Ct Head Wo Contrast  07/21/2013   CLINICAL DATA:  Numbness in both legs for 4 days.  Recent fall.  EXAM: CT HEAD WITHOUT  CONTRAST  TECHNIQUE: Contiguous axial images were obtained from the base of the skull through the vertex without intravenous contrast.  COMPARISON:  None.  FINDINGS: The ventricles are normal in configuration. There is mild ventricular and sulcal enlargement reflecting age related volume loss.  There are no parenchymal masses or mass effect. There is no evidence of a cortical infarct.  There are no extra-axial masses or abnormal fluid collections.  There is no intracranial hemorrhage.  The visualized sinuses and mastoid air cells are clear.  IMPRESSION: No acute intracranial abnormalities.  Age related volume loss.  Exam otherwise unremarkable.   Electronically Signed   By: Lajean Manes M.D.   On: 07/21/2013 17:31    EKG: Independently reviewed. A fib  Assessment/Plan Principal Problem:   Bilateral leg numbness Active Problems:   Focal neurological deficit  67 y.o. male with PAF, h/o DM presented with bilateral progressive ascending paresthesias, weakness in LE  1. Leg weakness, paraesthesia BL; r/o GBS: progressive, mostly symmetric muscle weakness and absent DTR; r/o demyelinating disorder -pend MRI, evaluated by neurology, possible LP today,? IVIG; defer to neurology; check TSH, B12  -patient is not in respiratory distress; close monitor respiratory status   2. A fib HR acceptable at the time of exam; cont BB, ASA  3. H/o DM, patient is not on any meds; check HA1C; [place]   Neurology;  if consultant consulted, please document name and whether formally or informally consulted  Code Status: full (must indicate code status--if unknown or must be presumed, indicate so) Family Communication: brother at thebedside (indicate person spoken with, if applicable, with phone number if by telephone) Disposition Plan: home when clinically improved  (indicate anticipated LOS)  Time spent: >35 minutes   Kinnie Feil [facility] Pager [phone]  If 7PM-7AM, please contact  night-coverage [email or web address] Password [facility] 07/21/2013, 7:08 PM

## 2013-07-21 NOTE — Consult Note (Signed)
Neurology Consultation Reason for Consult: Lower extremity numbness Referring Physician: Christy Gentles, D.  CC: Lower extremity numbness  History is obtained from: Patient  HPI: Marcus [name] is a 68 y.o. male a history of bilateral progressive ascending paresthesias that began on the bottoms of his feet and ascended to the midcalf. He also has some paresthesia of his hands. He has become progressively weaker of his lower extremities and is currently unable to walk without assistance. He recently fractured his right femur and feels that this leg is worse than his left.  His wife states that he has had the "sniffles" since he was discharged on 12/6, but no other clear recent illnesses.    ROS: A 14 point ROS was performed and is negative except as noted in the HPI.  Past Medical History  Diagnosis Date  . Atrial flutter   . AF (atrial fibrillation)   . Gout     Family History: No history of autoimmune diseases  Social History: Tob: Negative  Exam: Current vital signs: BP 132/80  Pulse 102  Temp(Src) 98.4 F (36.9 C) (Oral)  Resp 16  SpO2 94% Vital signs in last 24 hours: Temp:  [97.2 F (36.2 C)-98.4 F (36.9 C)] 98.4 F (36.9 C) (12/27 1747) Pulse Rate:  [83-102] 102 (12/27 1747) Resp:  [16-18] 16 (12/27 1747) BP: (123-132)/(80-86) 132/80 mmHg (12/27 1747) SpO2:  [94 %-97 %] 94 % (12/27 1747)  General: in bed, NAD CV: RRR Mental Status: Patient is awake, alert, oriented to person, place, month, year, and situation. Immediate and remote memory are intact. Patient is able to give a clear and coherent history. No signs of aphasia or neglect Cranial Nerves: II: Visual Fields are full. Pupils are equal, round, and reactive to light.  Discs are difficult to visualize. III,IV, VI: EOMI without ptosis or diploplia.  V: Facial sensation is symmetric to temperature VII: Facial movement is symmetric.  VIII: hearing is intact to voice X: Uvula elevates  symmetrically XI: Shoulder shrug is symmetric. XII: tongue is midline without atrophy or fasciculations.  Motor: Tone is normal. Bulk is normal. 5/5 strength was present in upper extremities, in lower he has 4-/5 dorsiflexion on right, 4./5 plantarflexion. 4-/5knee extension, on the left he has 4+/5 knee extension and ankle dorsi/plantarflexion.  Sensory: Sensation is decreased to temp and LT to the mid calves bilaterally. Deep Tendon Reflexes: Absent reflexes.  Plantars: Toes are downgoing bilaterally.  Cerebellar: Mild ataxia in bilateral arms.  Gait: Not tested due to patient safety concerns.   I have reviewed labs in epic and the results pertinent to this consultation are: Cbc, bmp unremarkable.   I have reviewed the images obtained: CT head - negative  Impression: 67 yo M with ascending numbness, weakness and areflexia. 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 He has had an LP with results pending.   Recommendations: 1) LP for protein, glucose, cellcount.  2) Start IVIG 0.4 mg/kg/day x 5 days.  3) Telemetry 4) PT/OT   Roland Rack, MD [facility] [phone]  If 7pm- 7am, please page neurology on call at [phone].

## 2013-07-21 NOTE — ED Notes (Signed)
Attempted to give report but charge on 4N is still looking at chart.

## 2013-07-21 NOTE — Procedures (Signed)
Indication: Guilian Barre syndrome  Risks of the procedure were dicussed with the patient including post-LP headache, bleeding, infection, weakness/numbness of legs(radiculopathy), death.  The patient agreed and written consent was obtained.   The patient was prepped and draped, and using sterile technique a 20 gauge quinke spinal needle was inserted inintiall in the lateral decubitus position at L4-5 space. After multiple attempts, the patient was repositioned to the sitting position and at L5-S1 CSF return was obtained which was initially blood tinged.  Approximately 8 cc of CSF were obtained and sent for analysis.

## 2013-07-21 NOTE — ED Notes (Signed)
Patient sat on the side of the bed in the orthopnic position.  Dr. Leonel Ramsay cleaned area again and used numbing meds.  Successful LP done this time and patient tolerated procedure well.  Area cleaned and dressing applied.

## 2013-07-21 NOTE — ED Provider Notes (Signed)
Patient seen/examined in the Emergency Department in conjunction with Resident Physician Provider Silvio Clayman Patient reports numbness to both hands/feet for over 3 days Exam : awake/alert, no arm/leg drift but does have sensory deficit to hands/feeet Plan: d/w neuro, will see patient.  GBS is a possibility   Sharyon Cable, MD 07/21/13 1556

## 2013-07-21 NOTE — ED Provider Notes (Signed)
CSN: [id number]     Arrival date & time 07/21/13  1358 History   First MD Initiated Contact with Patient 07/21/13 1503     Chief Complaint  Patient presents with  . Numbness  . Tingling   (Consider location/radiation/quality/duration/timing/severity/associated sxs/prior Treatment) HPI 67 yo M with hx of a-flutter, AF, gout presents with worsening weakness, stocking-glove distribution of numbness. Location: hands/feet. Radiation: none. Quality: numbness, tingling. Timing: 3-4 days. Severity: moderate. Ass'd symptoms: None. Prior treatment: None  Past Medical History  Diagnosis Date  . Atrial flutter   . AF (atrial fibrillation)   . Gout    Past Surgical History  Procedure Laterality Date  . Catheter ablation  12/23/2004  . Femur im nail Right 06/25/2013    Procedure: INTRAMEDULLARY (IM) RETROGRADE FEMORAL NAILING;  Surgeon: Marybelle Killings, MD;  Location: [place];  Service: Orthopedics;  Laterality: Right;   History reviewed. No pertinent family history. History  Substance Use Topics  . Smoking status: Never Smoker   . Smokeless tobacco: Not on file  . Alcohol Use: No    Review of Systems  Constitutional: Negative for fever and chills.  HENT: Negative for sore throat.   Eyes: Negative for pain.  Respiratory: Negative for cough and shortness of breath.   Cardiovascular: Negative for chest pain.  Gastrointestinal: Negative for nausea, vomiting and abdominal pain.  Genitourinary: Negative for dysuria and flank pain.  Musculoskeletal: Negative for back pain and neck pain.  Skin: Negative for rash.  Neurological: Negative for seizures and headaches.    Allergies  Review of patient's allergies indicates no known allergies.  Home Medications   No current outpatient prescriptions on file. BP 114/85  Pulse 89  Temp(Src) 98.3 F (36.8 C) (Oral)  Resp 18  Wt 190 lb 0.6 oz (86.2 kg)  SpO2 96% Physical Exam  Constitutional: He is oriented to person, place, and time. He appears  well-developed and well-nourished. No distress.  HENT:  Head: Normocephalic and atraumatic.  Eyes: Pupils are equal, round, and reactive to light.  Neck: Normal range of motion.  Cardiovascular: Normal rate, S1 normal and S2 normal.  An irregularly irregular rhythm present.  Pulses:      Dorsalis pedis pulses are 1+ on the right side, and 1+ on the left side.  Pulmonary/Chest: Effort normal and breath sounds normal.  Abdominal: Soft. He exhibits no distension. There is no tenderness.  Musculoskeletal: Normal range of motion.  Neurological: He is alert and oriented to person, place, and time. He has normal strength. A sensory deficit (decreased soft, sharp, temperature sensation on bilateral feet, R>L. decreased sensation to fingertips) is present. No cranial nerve deficit. GCS eye subscore is 4. GCS verbal subscore is 5. GCS motor subscore is 6.  Reflex Scores:      Patellar reflexes are 0 on the right side and 0 on the left side. Skin: Skin is warm. He is not diaphoretic.    ED Course  Procedures (including critical care time) Labs Review Labs Reviewed  BASIC METABOLIC PANEL - Abnormal; Notable for the following:    Glucose, Bld 103 (*)    BUN 32 (*)    GFR calc non Af Amer 59 (*)    GFR calc Af Amer 69 (*)    All other components within normal limits  CSF CELL COUNT WITH DIFFERENTIAL - Abnormal; Notable for the following:    RBC Count, CSF 9 (*)    All other components within normal limits  PROTEIN AND GLUCOSE, CSF -  Abnormal; Notable for the following:    Total  Protein, CSF 47 (*)    All other components within normal limits  CBC  URINALYSIS, ROUTINE W REFLEX MICROSCOPIC  PROTIME-INR  APTT  GLUCOSE, CAPILLARY  CBC  CREATININE, SERUM  HEMOGLOBIN A1C  VITAMIN B12  TSH   Imaging Review Ct Head Wo Contrast  07/21/2013   CLINICAL DATA:  Numbness in both legs for 4 days.  Recent fall.  EXAM: CT HEAD WITHOUT CONTRAST  TECHNIQUE: Contiguous axial images were obtained from  the base of the skull through the vertex without intravenous contrast.  COMPARISON:  None.  FINDINGS: The ventricles are normal in configuration. There is mild ventricular and sulcal enlargement reflecting age related volume loss.  There are no parenchymal masses or mass effect. There is no evidence of a cortical infarct.  There are no extra-axial masses or abnormal fluid collections.  There is no intracranial hemorrhage.  The visualized sinuses and mastoid air cells are clear.  IMPRESSION: No acute intracranial abnormalities.  Age related volume loss.  Exam otherwise unremarkable.   Electronically Signed   By: Lajean Manes M.D.   On: 07/21/2013 17:31   Mr Cervical Spine Wo Contrast  07/21/2013   CLINICAL DATA:  Five-day history of bilateral hand and foot numbness  EXAM: MRI CERVICAL SPINE WITHOUT CONTRAST  TECHNIQUE: Multiplanar, multisequence MR imaging was performed. No intravenous contrast was administered.  COMPARISON:  None.  FINDINGS: Image quality degraded by mild motion.  Negative for fracture or mass. Cervical alignment is normal. Spinal cord signal is normal. Craniocervical junction is normal.  C2-3:  Negative  C3-4:  Mild degenerative change  C4-5:  Mild degenerative change  C5-6: Mild degenerative change with mild spurring. No significant spinal stenosis  C6-7: Early spondylosis with mild foraminal narrowing bilaterally. No significant spinal stenosis  C7-T1:  Negative  IMPRESSION: Mild cervical degenerative changes without significant spinal stenosis. Mild spondylosis at C6-7 with mild foraminal narrowing bilaterally.   Electronically Signed   By: Franchot Gallo M.D.   On: 07/21/2013 20:09    EKG Interpretation    Date/Time:  Saturday July 21 2013 14:10:28 EST Ventricular Rate:  95 PR Interval:    QRS Duration: 92 QT Interval:  368 QTC Calculation: 462 R Axis:   79 Text Interpretation:  Atrial fibrillation Nonspecific ST abnormality Abnormal ECG Confirmed by Christy Gentles  MD, DONALD  [phone]) on 07/21/2013 3:50:46 PM            MDM   1. Weakness   2. Numbness and tingling of both legs   3. Atrial fibrillation   4. Bilateral leg numbness     68 yo M with hx of DM, afib/flutter, presents with stocking-glove distribution of numbness.   Given patient accompanying weakness, concern for possible ascending paralysis, concern for GBS. Neurologist and hospitalist consulted for further workup. Head/neck imaging performed, negative for acute pathology. LP planned, to be performed by neurologist.   Patient admitted in stable condition for continued workup. Patient seen and evaluated by myself and my attending, Dr. Christy Gentles.     Freddi Che, MD 07/22/13 [phone]

## 2013-07-21 NOTE — ED Notes (Signed)
Specimens carried to the lab per policy.

## 2013-07-21 NOTE — ED Notes (Signed)
Dr. Leonel Ramsay explained the procedure and the pros and cons of the procedure.  Patient and brother voiced understanding.  Originally attempted to do LP with patint laying on his left side with his knees up and back bent.  Unable to obtain specimen.

## 2013-07-21 NOTE — ED Provider Notes (Signed)
Dr Leonel Ramsay feels pt need CT head and MR cspine and may need LP to fully evaluate for GBS  Marcus Cable, MD 07/21/13 1620

## 2013-07-22 DIAGNOSIS — I4891 Unspecified atrial fibrillation: Secondary | ICD-10-CM

## 2013-07-22 DIAGNOSIS — G610 Guillain-Barre syndrome: Principal | ICD-10-CM

## 2013-07-22 LAB — CBC
HCT: 43.6 % (ref 39.0–52.0)
MCHC: 33 g/dL (ref 30.0–36.0)
MCV: 96.9 fL (ref 78.0–100.0)
Platelets: 219 10*3/uL (ref 150–400)
RBC: 4.5 MIL/uL (ref 4.22–5.81)
RDW: 14.8 % (ref 11.5–15.5)
WBC: 6.7 10*3/uL (ref 4.0–10.5)

## 2013-07-22 LAB — CREATININE, SERUM
Creatinine, Ser: 1.1 mg/dL (ref 0.50–1.35)
GFR calc Af Amer: 77 mL/min — ABNORMAL LOW (ref >90)
GFR calc non Af Amer: 67 mL/min — ABNORMAL LOW (ref >90)

## 2013-07-22 LAB — GLUCOSE, CAPILLARY: Glucose-Capillary: 125 mg/dL — ABNORMAL HIGH (ref 70–99)

## 2013-07-22 LAB — VITAMIN B12: Vitamin B-12: 289 pg/mL (ref 211–911)

## 2013-07-22 NOTE — Progress Notes (Signed)
Subjective: Patient doesn't appreciate any significant change  Exam: Filed Vitals:   07/22/13 1002  BP: 135/77  Pulse: 102  Temp: 97.6 F (36.4 C)  Resp: 16   Gen: In bed, NAD MS: Awake, alert, interactive and appropriate CN: Pupils equal round and reactive to light, extraocular movements intact, face symmetric Motor: 5/5 proximally in the upper extremities, he has distal 4/5 interossei and 3/5 APB weakness. In his lower extremities, he has 4/5 dorsiflexion on the left 4 minus/5 on the right 4/5 knee flexion on the left 4+/5 knee extension on left, 4 minus/5 knee extension and flexion on the right. Sensory: Decreased vibration to the wrists. Decreased temperature sensation midcalf. DTR: Absent  Protein was mildly elevated at 47  Impression: 67 year old male with ascending paresthesias, areflexia, and weakness with mildly elevated CSF protein and no pleocytosis. This constellation of symptoms is by far most consistent with Guillain-Barr syndrome. The patient needs telemetry as these patients can develop cardiac irregularities.  Recommendations: 1) IVIG 0.4 mg/kg x5 days 2) physical therapy 3) telemetry  Roland Rack, MD [facility] [phone]  If 7pm- 7am, please page neurology on call at [phone].

## 2013-07-22 NOTE — Progress Notes (Addendum)
Chart and old records reviewed.   [facility] PROGRESS NOTE  Marcus [name] SVX:[id number] DOB: 08-29-1945 DOA: 07/21/2013 PCP: Deloria Lair, MD  Assessment/Plan:  Principal Problem:   GBS (Guillain Barre syndrome): on IVIG. PT, OT eval pending. Active Problems:   Atrial fibrillation with RVR: earlier HR in the 120s-150s per RN. Now 90. A fib is chronic. Only on ASA due to low stroke risk per cardiology note in 1/14 Recent right femur fx   Code Status:  full Family Communication:   Disposition Plan:  home  Consultants:  neuro  Procedures:     Antibiotics:    HPI/Subjective: Weakness unchanged. Now in hands as well. No dyspnea. No palpitations  Objective: Filed Vitals:   07/22/13 1822  BP: 140/79  Pulse: 105  Temp: 97.9 F (36.6 C)  Resp: 20    Intake/Output Summary (Last 24 hours) at 07/22/13 1937 Last data filed at 07/22/13 1221  Gross per 24 hour  Intake    700 ml  Output    300 ml  Net    400 ml   Filed Weights   07/21/13 2023  Weight: 86.2 kg (190 lb 0.6 oz)    Exam:   General:  comfortable  Cardiovascular: irreg irreg without MGR  Respiratory: cta without WRR  Abdomen: S, NT, ND  Ext:  No CCE  Neuro: slightly uncoordinated upper extremity movement. Legs 4/5 strength  Basic Metabolic Panel:  Recent Labs Lab 07/21/13 1622 07/22/13 0822  NA 140  --   K 4.2  --   CL 103  --   CO2 25  --   GLUCOSE 103*  --   BUN 32*  --   CREATININE 1.21 1.10  CALCIUM 9.1  --    Liver Function Tests: No results found for this basename: AST, ALT, ALKPHOS, BILITOT, PROT, ALBUMIN,  in the last 168 hours No results found for this basename: LIPASE, AMYLASE,  in the last 168 hours No results found for this basename: AMMONIA,  in the last 168 hours CBC:  Recent Labs Lab 07/21/13 1622 07/22/13 0822  WBC 8.6 6.7  HGB 15.4 14.4  HCT 45.9 43.6  MCV 96.8 96.9  PLT 255 219   Cardiac Enzymes: No results found for this  basename: CKTOTAL, CKMB, CKMBINDEX, TROPONINI,  in the last 168 hours BNP (last 3 results) No results found for this basename: PROBNP,  in the last 8760 hours CBG:  Recent Labs Lab 07/21/13 2230 07/22/13 0651 07/22/13 1129 07/22/13 1654  GLUCAP 98 125* 159* 88    No results found for this or any previous visit (from the past 240 hour(s)).   Studies: Ct Head Wo Contrast  07/21/2013   CLINICAL DATA:  Numbness in both legs for 4 days.  Recent fall.  EXAM: CT HEAD WITHOUT CONTRAST  TECHNIQUE: Contiguous axial images were obtained from the base of the skull through the vertex without intravenous contrast.  COMPARISON:  None.  FINDINGS: The ventricles are normal in configuration. There is mild ventricular and sulcal enlargement reflecting age related volume loss.  There are no parenchymal masses or mass effect. There is no evidence of a cortical infarct.  There are no extra-axial masses or abnormal fluid collections.  There is no intracranial hemorrhage.  The visualized sinuses and mastoid air cells are clear.  IMPRESSION: No acute intracranial abnormalities.  Age related volume loss.  Exam otherwise unremarkable.   Electronically Signed   By: Lajean Manes M.D.   On: 07/21/2013 17:31  Mr Cervical Spine Wo Contrast  07/21/2013   CLINICAL DATA:  Five-day history of bilateral hand and foot numbness  EXAM: MRI CERVICAL SPINE WITHOUT CONTRAST  TECHNIQUE: Multiplanar, multisequence MR imaging was performed. No intravenous contrast was administered.  COMPARISON:  None.  FINDINGS: Image quality degraded by mild motion.  Negative for fracture or mass. Cervical alignment is normal. Spinal cord signal is normal. Craniocervical junction is normal.  C2-3:  Negative  C3-4:  Mild degenerative change  C4-5:  Mild degenerative change  C5-6: Mild degenerative change with mild spurring. No significant spinal stenosis  C6-7: Early spondylosis with mild foraminal narrowing bilaterally. No significant spinal stenosis   C7-T1:  Negative  IMPRESSION: Mild cervical degenerative changes without significant spinal stenosis. Mild spondylosis at C6-7 with mild foraminal narrowing bilaterally.   Electronically Signed   By: Franchot Gallo M.D.   On: 07/21/2013 20:09    Scheduled Meds: . allopurinol  300 mg Oral Daily  . aspirin EC  325 mg Oral Daily  . docusate sodium  100 mg Oral BID  . enoxaparin (LOVENOX) injection  40 mg Subcutaneous Q24H  . Immune Globulin 5%  400 mg/kg Intravenous Q24 Hr x 5  . insulin aspart  0-9 Units Subcutaneous TID WC  . metoprolol  75 mg Oral BID  . sodium chloride  3 mL Intravenous Q12H   Continuous Infusions: . sodium chloride 75 mL/hr at 07/22/13 1339    Time spent: 25 minutes  [facility] Pager [phone]. If 7PM-7AM, please contact night-coverage at [email or web address], password [facility] 07/22/2013, 7:37 PM  LOS: 1 day

## 2013-07-22 NOTE — ED Provider Notes (Signed)
I have personally seen and examined the patient.  I have discussed the plan of care with the resident.  I have reviewed the documentation on PMH/FH/Soc. History.  I have reviewed the documentation of the resident and agree.  I have reviewed and agree with the ECG interpretation(s) documented by the resident.   Sharyon Cable, MD 07/22/13 [phone]

## 2013-07-22 NOTE — Progress Notes (Signed)
Pt. With heart rate sustaining 120s-150s on monitor. In A-fib. Pt. Is asymptomatic. Dr. Conley Canal notified; no further orders received. Will monitor.

## 2013-07-23 LAB — GLUCOSE, CAPILLARY: Glucose-Capillary: 100 mg/dL — ABNORMAL HIGH (ref 70–99)

## 2013-07-23 NOTE — Evaluation (Signed)
Physical Therapy Evaluation Patient Details Name: Marcus [name] MRN: [id number] DOB: 1945-09-07 Today's Date: 07/23/2013 Time: 0254-2706 PT Time Calculation (min): 25 min  PT Assessment / Plan / Recommendation History of Present Illness  67 year old male with ascending paresthesias, areflexia, and weakness with mildly elevated CSF protein and no pleocytosis. This constellation of symptoms is by far most consistent with Guillain-Barr syndrome. Pt is also 4 wks s/p IM nailing of R femur fx.  Clinical Impression  Patient demonstrates deficits in functional mobility as indicated below. Patient will benefit from continued skilled PT to address deficits and maximize recovery. Will continue to see and progress activity as tolerated. Given nature of fatigue with GBS, recommend HHPT upon discharge until patient is deemed more appropriate to resume outpatient services.    PT Assessment  Patient needs continued PT services    Follow Up Recommendations  Home health PT          Equipment Recommendations  None recommended by PT       Frequency Min 4X/week    Precautions / Restrictions Precautions Precautions: Fall Restrictions Weight Bearing Restrictions: Yes RLE Weight Bearing: Weight bearing as tolerated   Pertinent Vitals/Pain No pain reported, varying elevations in HR throughout activity but resolved with rest.      Mobility  Bed Mobility Bed Mobility: Supine to Sit;Sitting - Scoot to Edge of Bed;Sit to Supine Supine to Sit: 5: Supervision Sitting - Scoot to Edge of Bed: 5: Supervision Sit to Supine: 5: Supervision Details for Bed Mobility Assistance: VCs for positioning, increased time to perform Transfers Transfers: Sit to Stand;Stand to Sit Sit to Stand: 4: Min assist Stand to Sit: 4: Min guard;With upper extremity assist;To bed Details for Transfer Assistance: VCs for hand placement, assist to stabilize RW upon standing Ambulation/Gait Ambulation/Gait  Assistance: 4: Min guard Ambulation Distance (Feet): 170 Feet Assistive device: Rolling walker Ambulation/Gait Assistance Details: noted weakness bilateral LE with functional mobility, heavy reliance on RW, increased HE Gait Pattern: Step-through pattern;Antalgic Gait velocity: Decreased General Gait Details: Cues for quad setting to promote established gait and preven R Knee buckling    Exercises General Exercises - Lower Extremity Ankle Circles/Pumps: AROM Quad Sets: AROM;Strengthening Long Arc Quad: AROM;Strengthening   PT Diagnosis: Difficulty walking;Generalized weakness  PT Problem List: Decreased strength;Decreased range of motion;Decreased activity tolerance;Decreased balance;Decreased mobility;Decreased safety awareness;Decreased knowledge of use of DME;Pain PT Treatment Interventions: DME instruction;Gait training;Stair training;Functional mobility training;Therapeutic activities;Therapeutic exercise;Neuromuscular re-education;Patient/family education     PT Goals(Current goals can be found in the care plan section) Acute Rehab PT Goals Patient Stated Goal: To return home with wife PT Goal Formulation: With patient Time For Goal Achievement: 07/03/13 Potential to Achieve Goals: Good  Visit Information  Last PT Received On: 07/23/13 Assistance Needed: +1 History of Present Illness: 67 year old male with ascending paresthesias, areflexia, and weakness with mildly elevated CSF protein and no pleocytosis. This constellation of symptoms is by far most consistent with Guillain-Barr syndrome. Pt is also 4 wks s/p IM nailing of R femur fx.       Prior [place] expects to be discharged to:: Private residence Living Arrangements: Spouse/significant other Available Help at Discharge: Family;Friend(s);Available PRN/intermittently Type of Home: House Home Access: Stairs to enter Entrance Stairs-Number of Steps: 1 Home Layout: Two level;Able to live  on main level with bedroom/bathroom Home Equipment: None Prior Function Level of Independence: Independent;Independent with assistive device(s) Comments: PT s/p femur fx with IM nailing 1 month ago, PLF before that was  independent without device Communication Communication: No difficulties Dominant Hand: Right    Cognition  Cognition Arousal/Alertness: Awake/alert Behavior During Therapy: WFL for tasks assessed/performed Overall Cognitive Status: Within Functional Limits for tasks assessed    Extremity/Trunk Assessment Upper Extremity Assessment Upper Extremity Assessment: Defer to OT evaluation Lower Extremity Assessment Lower Extremity Assessment: Generalized weakness;RLE deficits/detail RLE Deficits / Details: decreased strength and AROM grossly 3+/5 RLE Sensation: decreased light touch RLE Coordination: decreased gross motor Cervical / Trunk Assessment Cervical / Trunk Assessment: Normal   Balance Static Sitting Balance Static Sitting - Balance Support: Feet supported;No upper extremity supported Static Sitting - Level of Assistance: 5: Stand by assistance Static Sitting - Comment/# of Minutes: 5 Static Standing Balance Static Standing - Balance Support: Bilateral upper extremity supported Static Standing - Level of Assistance: 5: Stand by assistance Static Standing - Comment/# of Minutes: using RW, 3 minutes standing rest break  End of Session PT - End of Session Equipment Utilized During Treatment: Gait belt Activity Tolerance: Patient limited by fatigue Patient left: in chair;with call bell/phone within reach Nurse Communication: Mobility status  GP     [name] 07/23/2013, 2:24 PM Alben Deeds, [place] DPT  [phone]

## 2013-07-23 NOTE — Evaluation (Signed)
Occupational Therapy Evaluation Patient Details Name: Marcus [name] MRN: [id number] DOB: September 07, 1945 Today's Date: 07/23/2013 Time: 9563-8756 OT Time Calculation (min): 38 min  OT Assessment / Plan / Recommendation History of present illness 67 year old male with ascending paresthesias, areflexia, and weakness with mildly elevated CSF protein and no pleocytosis. This constellation of symptoms is by far most consistent with Guillain-Barr syndrome. Pt is also 4 wks s/p IM nailing of R femur fx.   Clinical Impression   Pt presents with weakness interfering with ability to perform ADL and mobility independently.  Pt was using AE prior to admission for LB ADL and shower with supervision for transfer to shower seat in tub.  He is now RW dependent and requires assist for managing containers and small items due to weakness in his hands and numbness. He has been instructed in theraputty exercises this date for use between therapy session.  Anticipate that pt will be able to discharge home with wife.    OT Assessment  Patient needs continued OT Services    Follow Up Recommendations  Home health OT;Supervision/Assistance - 24 hour    Barriers to Discharge      Equipment Recommendations  None recommended by OT    Recommendations for Other Services    Frequency  Min 2X/week    Precautions / Restrictions Precautions Precautions: Fall Restrictions Weight Bearing Restrictions: Yes RLE Weight Bearing: Weight bearing as tolerated   Pertinent Vitals/Pain Pt with afib, no pain    ADL  Eating/Feeding: Set up Where Assessed - Eating/Feeding: Edge of bed Grooming: Wash/dry hands;Wash/dry face;Teeth care;Shaving;Set up Where Assessed - Grooming: Unsupported sitting Upper Body Bathing: Set up Where Assessed - Upper Body Bathing: Unsupported sitting Lower Body Bathing: Minimal assistance Where Assessed - Lower Body Bathing: Unsupported sitting;Supported sit to stand Upper Body Dressing:  Set up Where Assessed - Upper Body Dressing: Unsupported sitting Lower Body Dressing: Minimal assistance Where Assessed - Lower Body Dressing: Unsupported sitting;Supported sit to stand Toilet Transfer: Min Psychiatric nurse Method: Sit to Loss adjuster, chartered: Comfort height toilet Toileting - Clothing Manipulation and Hygiene: Performed;Minimal assistance Where Assessed - Best boy and Hygiene: Sit to stand from 3-in-1 or toilet Equipment Used: Gait belt;Rolling walker (med soft putty) Transfers/Ambulation Related to ADLs: min guard with RW, verbal cues for hand placement on handles ADL Comments: Educated pt in theraputty activities and availability of tub transfer bench in place of tub seat which requires him to wait on a man to assist him to transfer.    OT Diagnosis: Generalized weakness  OT Problem List: Decreased strength;Decreased activity tolerance;Impaired balance (sitting and/or standing);Decreased knowledge of use of DME or AE;Impaired UE functional use;Decreased coordination OT Treatment Interventions: Self-care/ADL training;Therapeutic exercise;DME and/or AE instruction;Therapeutic activities;Patient/family education;Balance training   OT Goals(Current goals can be found in the care plan section) Acute Rehab OT Goals Patient Stated Goal: To return home with wife OT Goal Formulation: With patient Time For Goal Achievement: 08/06/13 Potential to Achieve Goals: Good ADL Goals Pt Will Perform Eating: Independently;sitting Pt Will Perform Grooming: with supervision;standing Pt Will Perform Lower Body Bathing: with supervision;with adaptive equipment;sit to/from stand Pt Will Perform Lower Body Dressing: with supervision;sit to/from stand;with adaptive equipment Pt Will Transfer to Toilet: with supervision;ambulating Pt Will Perform Toileting - Clothing Manipulation and hygiene: with supervision;sit to/from stand Pt Will Perform Tub/Shower  Transfer: with supervision;ambulating;shower seat;rolling walker Pt/caregiver will Perform Home Exercise Program: Increased strength;Right Upper extremity;Left upper extremity;With theraputty;Independently Additional ADL Goal #1: Pt will employ  energy conservation strategies in ADL independently.  Visit Information  Last OT Received On: 07/23/13 Assistance Needed: +1 History of Present Illness: 67 year old male with ascending paresthesias, areflexia, and weakness with mildly elevated CSF protein and no pleocytosis. This constellation of symptoms is by far most consistent with Guillain-Barr syndrome. Pt is also 4 wks s/p IM nailing of R femur fx.       Prior [place] expects to be discharged to:: Private residence Living Arrangements: Spouse/significant other Available Help at Discharge: Family;Friend(s);Available PRN/intermittently Type of Home: House Home Access: Stairs to enter Entrance Stairs-Number of Steps: 1 Home Layout: Two level;Able to live on main level with bedroom/bathroom Home Equipment: Bedside commode;Walker - 2 wheels;Shower seat;Adaptive equipment Adaptive Equipment: Reacher;Sock aid;Long-handled shoe horn;Long-handled sponge Prior Function Level of Independence: Needs assistance ADL's / Homemaking Assistance Needed: pt was assisted for tub transfer, used AE for LB ADL Comments: PT s/p femur fx with IM nailing 1 month ago, PLF before that was independent without device Communication Communication: No difficulties Dominant Hand: Right         Vision/Perception Vision - History Baseline Vision: Wears glasses all the time Patient Visual Report: No change from baseline   Cognition  Cognition Arousal/Alertness: Awake/alert Behavior During Therapy: WFL for tasks assessed/performed Overall Cognitive Status: Within Functional Limits for tasks assessed    Extremity/Trunk Assessment Upper Extremity Assessment Upper Extremity  Assessment: RUE deficits/detail;LUE deficits/detail RUE Deficits / Details: 5/5 shoulder to forearm, 4/5 wrist, 4-/5 fingers/hand RUE Sensation: decreased light touch (numbness from wrist to fingers) RUE Coordination: decreased fine motor LUE Deficits / Details: 5/5 shoulder to forearm, wrist 4/5, fingers/hand 4-/5 LUE Sensation: decreased light touch (numbness from wrist through fingers) LUE Coordination: decreased fine motor Lower Extremity Assessment Lower Extremity Assessment: Defer to PT evaluation RLE Deficits / Details: decreased strength and AROM grossly 3+/5 RLE Sensation: decreased light touch RLE Coordination: decreased gross motor Cervical / Trunk Assessment Cervical / Trunk Assessment: Normal     Mobility Bed Mobility Bed Mobility: Supine to Sit;Sitting - Scoot to Edge of Bed;Sit to Supine Supine to Sit: 5: Supervision Sitting - Scoot to Edge of Bed: 5: Supervision Sit to Supine: 5: Supervision Details for Bed Mobility Assistance: VCs for positioning, increased time to perform Transfers Sit to Stand: 4: Min assist Stand to Sit: 4: Min guard;With upper extremity assist;To bed Details for Transfer Assistance: VCs for hand placement, assist to stabilize RW upon standing     Exercise General Exercises - Lower Extremity Ankle Circles/Pumps: AROM Quad Sets: AROM;Strengthening Long Arc Quad: AROM;Strengthening   Balance Balance Balance Assessed: Yes Static Sitting Balance Static Sitting - Balance Support: Feet supported;No upper extremity supported Static Sitting - Level of Assistance: 7: Independent Static Sitting - Comment/# of Minutes: 15 Static Standing Balance Static Standing - Balance Support: Bilateral upper extremity supported Static Standing - Level of Assistance: 5: Stand by assistance Static Standing - Comment/# of Minutes: using RW, 3 minutes standing rest break   End of Session OT - End of Session Activity Tolerance: Patient tolerated treatment well (pt  with asymptomatic a fib) Patient left: in bed;with call bell/phone within reach  GO     Malka So 07/23/2013, 3:02 PM [phone]

## 2013-07-23 NOTE — Progress Notes (Signed)
[facility] PROGRESS NOTE  Marcus [name] CWU:[id number] DOB: [birth year] DOA: 07/21/2013 PCP: Deloria Lair, MD  Assessment/Plan:  Principal Problem:   GBS (Guillain Barre syndrome): on IVIG. PT, OT eval pending. Active Problems:   Atrial fibrillation remains rate controlled Recent right femur fx   Code Status:  full Family Communication:   Disposition Plan:  home  Consultants:  neuro  Procedures:     Antibiotics:    HPI/Subjective: Seems slightly less weak  Objective: Filed Vitals:   07/23/13 1402  BP: 145/79  Pulse: 97  Temp: 97.8 F (36.6 C)  Resp: 18    Intake/Output Summary (Last 24 hours) at 07/23/13 1558 Last data filed at 07/23/13 1500  Gross per 24 hour  Intake    240 ml  Output   1100 ml  Net   -860 ml   Filed Weights   07/21/13 2023  Weight: 86.2 kg (190 lb 0.6 oz)    Exam:   General:  comfortable  Cardiovascular: irreg irreg without MGR  Respiratory: cta without WRR  Abdomen: S, NT, ND  Ext:  No CCE  Neuro: slightly uncoordinated upper extremity movement. Legs 4/5 strength  Basic Metabolic Panel:  Recent Labs Lab 07/21/13 1622 07/22/13 0822  NA 140  --   K 4.2  --   CL 103  --   CO2 25  --   GLUCOSE 103*  --   BUN 32*  --   CREATININE 1.21 1.10  CALCIUM 9.1  --    Liver Function Tests: No results found for this basename: AST, ALT, ALKPHOS, BILITOT, PROT, ALBUMIN,  in the last 168 hours No results found for this basename: LIPASE, AMYLASE,  in the last 168 hours No results found for this basename: AMMONIA,  in the last 168 hours CBC:  Recent Labs Lab 07/21/13 1622 07/22/13 0822  WBC 8.6 6.7  HGB 15.4 14.4  HCT 45.9 43.6  MCV 96.8 96.9  PLT 255 219   Cardiac Enzymes: No results found for this basename: CKTOTAL, CKMB, CKMBINDEX, TROPONINI,  in the last 168 hours BNP (last 3 results) No results found for this basename: PROBNP,  in the last 8760 hours CBG:  Recent Labs Lab  07/22/13 1129 07/22/13 1654 07/22/13 2157 07/23/13 0704 07/23/13 1136  GLUCAP 159* 88 136* 100* 100*    No results found for this or any previous visit (from the past 240 hour(s)).   Studies: Ct Head Wo Contrast  07/21/2013   CLINICAL DATA:  Numbness in both legs for 4 days.  Recent fall.  EXAM: CT HEAD WITHOUT CONTRAST  TECHNIQUE: Contiguous axial images were obtained from the base of the skull through the vertex without intravenous contrast.  COMPARISON:  None.  FINDINGS: The ventricles are normal in configuration. There is mild ventricular and sulcal enlargement reflecting age related volume loss.  There are no parenchymal masses or mass effect. There is no evidence of a cortical infarct.  There are no extra-axial masses or abnormal fluid collections.  There is no intracranial hemorrhage.  The visualized sinuses and mastoid air cells are clear.  IMPRESSION: No acute intracranial abnormalities.  Age related volume loss.  Exam otherwise unremarkable.   Electronically Signed   By: Lajean Manes M.D.   On: 07/21/2013 17:31   Mr Cervical Spine Wo Contrast  07/21/2013   CLINICAL DATA:  Five-day history of bilateral hand and foot numbness  EXAM: MRI CERVICAL SPINE WITHOUT CONTRAST  TECHNIQUE: Multiplanar, multisequence MR imaging was performed. No  intravenous contrast was administered.  COMPARISON:  None.  FINDINGS: Image quality degraded by mild motion.  Negative for fracture or mass. Cervical alignment is normal. Spinal cord signal is normal. Craniocervical junction is normal.  C2-3:  Negative  C3-4:  Mild degenerative change  C4-5:  Mild degenerative change  C5-6: Mild degenerative change with mild spurring. No significant spinal stenosis  C6-7: Early spondylosis with mild foraminal narrowing bilaterally. No significant spinal stenosis  C7-T1:  Negative  IMPRESSION: Mild cervical degenerative changes without significant spinal stenosis. Mild spondylosis at C6-7 with mild foraminal narrowing  bilaterally.   Electronically Signed   By: Franchot Gallo M.D.   On: 07/21/2013 20:09    Scheduled Meds: . allopurinol  300 mg Oral Daily  . aspirin EC  325 mg Oral Daily  . docusate sodium  100 mg Oral BID  . enoxaparin (LOVENOX) injection  40 mg Subcutaneous Q24H  . Immune Globulin 5%  400 mg/kg Intravenous Q24 Hr x 5  . insulin aspart  0-9 Units Subcutaneous TID WC  . metoprolol  75 mg Oral BID  . sodium chloride  3 mL Intravenous Q12H   Continuous Infusions: . sodium chloride 75 mL/hr at 07/23/13 1509    Time spent: 15 minutes  [facility] Pager [phone]. If 7PM-7AM, please contact night-coverage at [email or web address], password [facility] 07/23/2013, 3:58 PM  LOS: 2 days

## 2013-07-23 NOTE — Progress Notes (Signed)
Subjective: No changes  Exam: Filed Vitals:   07/23/13 0926  BP: 136/74  Pulse: 97  Temp: 98.1 F (36.7 C)  Resp: 18   Gen: In bed, NAD MS: Awake, alert, interactive and appropriate CN: Pupils equal round and reactive to light, extraocular movements intact, face symmetric Motor: 5/5 proximally in the upper extremities, he has distal 4/5 interossei and 3/5 APB weakness. In his lower extremities, he has 4/5 dorsiflexion on the left 4 minus/5 on the right 4/5 knee flexion on the left 4+/5 knee extension on left, 4 minus/5 knee extension and flexion on the right. Sensory:  Decreased touch sensation to midcalf. DTR: Absent  Protein was mildly elevated at 47  Impression: 67 year old male with ascending paresthesias, areflexia, and weakness with mildly elevated CSF protein and no pleocytosis. This constellation of symptoms is by far most consistent with Guillain-Barr syndrome. The patient needs continued telemetry as these patients can develop cardiac irregularities. Seems stable since yesterday  Recommendations: 1) IVIG 0.4 mg/kg x5 doses, received 2/5 2) physical therapy 3) telemetry  Roland Rack, MD [facility] [phone]  If 7pm- 7am, please page neurology on call at [phone].

## 2013-07-23 NOTE — Progress Notes (Signed)
UR COMPLETED  

## 2013-07-24 LAB — GLUCOSE, CAPILLARY: Glucose-Capillary: 88 mg/dL (ref 70–99)

## 2013-07-24 MED ORDER — METOPROLOL TARTRATE 100 MG PO TABS
100.0000 mg | ORAL_TABLET | Freq: Two times a day (BID) | ORAL | Status: DC
  Administered 2013-07-24 – 2013-07-26 (×5): 100 mg via ORAL
  Filled 2013-07-24 (×7): qty 1

## 2013-07-24 NOTE — Progress Notes (Signed)
Physical Therapy Treatment Patient Details Name: Marcus [name] MRN: [id number] DOB: Nov 14, 1945 Today's Date: 07/24/2013 Time: 2800-3491 PT Time Calculation (min): 18 min  PT Assessment / Plan / Recommendation  History of Present Illness 67 year old male with ascending paresthesias, areflexia, and weakness with mildly elevated CSF protein and no pleocytosis. This constellation of symptoms is by far most consistent with Guillain-Barr syndrome. Pt is also 4 wks s/p IM nailing of R femur fx.   PT Comments   Ambulated in hall again today, continues to demonstrate functional weakness with mobility. HR elevated to 160s but decreased to 120s with rest. Patient reports less exhaustion today compared to prior session. Will continue to see and progress as tolerated.  Follow Up Recommendations  Home health PT           Equipment Recommendations  None recommended by PT       Frequency Min 4X/week   Progress towards PT Goals Progress towards PT goals: Progressing toward goals  Plan Current plan remains appropriate    Precautions / Restrictions Precautions Precautions: Fall Restrictions Weight Bearing Restrictions: Yes RLE Weight Bearing: Weight bearing as tolerated   Pertinent Vitals/Pain No pain, HR 120s at rest 160s with ambulation    Mobility  Bed Mobility Bed Mobility: Supine to Sit;Sitting - Scoot to Edge of Bed;Sit to Supine Supine to Sit: 5: Supervision Sitting - Scoot to Edge of Bed: 5: Supervision Sit to Supine: 5: Supervision Details for Bed Mobility Assistance: Increased time to perform Transfers Transfers: Sit to Stand;Stand to Sit Sit to Stand: 4: Min assist Stand to Sit: 4: Min guard;With upper extremity assist;To bed Details for Transfer Assistance: VCs for hand placement, assist to stabilize RW upon standing Ambulation/Gait Ambulation/Gait Assistance: 4: Min guard Ambulation Distance (Feet): 160 Feet Assistive device: Rolling walker Gait Pattern:  Step-through pattern;Left genu recurvatum;Antalgic Gait velocity: Decreased General Gait Details: Pt not with some genurecurvatum during ambulation      PT Goals (current goals can now be found in the care plan section) Acute Rehab PT Goals Patient Stated Goal: To return home with wife PT Goal Formulation: With patient Time For Goal Achievement: 07/03/13 Potential to Achieve Goals: Good  Visit Information  Last PT Received On: 07/24/13 Assistance Needed: +1 History of Present Illness: 67 year old male with ascending paresthesias, areflexia, and weakness with mildly elevated CSF protein and no pleocytosis. This constellation of symptoms is by far most consistent with Guillain-Barr syndrome. Pt is also 4 wks s/p IM nailing of R femur fx.    Subjective Data  Patient Stated Goal: To return home with wife   Cognition  Cognition Arousal/Alertness: Awake/alert Behavior During Therapy: WFL for tasks assessed/performed Overall Cognitive Status: Within Functional Limits for tasks assessed    Balance  Static Sitting Balance Static Sitting - Balance Support: Feet supported;No upper extremity supported Static Sitting - Level of Assistance: 5: Stand by assistance Static Standing Balance Static Standing - Balance Support: Bilateral upper extremity supported Static Standing - Level of Assistance: 5: Stand by assistance  End of Session PT - End of Session Equipment Utilized During Treatment: Gait belt Activity Tolerance: Patient limited by fatigue Patient left: in bed;with call bell/phone within reach;with bed alarm set;with family/visitor present Nurse Communication: Mobility status   GP     [name] 07/24/2013, 11:51 AM Alben Deeds, PT DPT  [phone]

## 2013-07-24 NOTE — Progress Notes (Signed)
Talked to pt/ spouse about home health care choices, patient was active with [facility] and request to use their services again at discharge; [facility] called for arrangements; Attending MD at discharge, please order HHPT/OT; Aneta Mins [phone]

## 2013-07-24 NOTE — Progress Notes (Addendum)
Subjective: No changes  Exam: Filed Vitals:   07/24/13 0932  BP: 143/85  Pulse: 93  Temp: 97.9 F (36.6 C)  Resp: 20   Gen: In bed, NAD MS: Awake, alert, interactive and appropriate CN: Pupils equal round and reactive to light, extraocular movements intact, face symmetric Motor: 5/5 proximally in the upper extremities, he has distal 4/5 interossei and 3/5 APB weakness. In his lower extremities, he has 4/5 dorsiflexion on the left 4 minus/5 on the right 4/5 knee flexion on the left 4+/5 knee extension on left, 4 minus/5 knee extension and flexion on the right. Sensory:  Decreased touch sensation to midcalf. Cerebellar: slightly improved FNF on exam.  DTR: Absent  Impression: 67 year old male with ascending paresthesias, areflexia, UE ataxia, and LE/hand weakness with mildly elevated CSF protein and no pleocytosis. This constellation of symptoms is by far most consistent with Guillain-Barr syndrome. The patient needs continued telemetry as these patients can develop cardiac irregularities. I suspect that he has reached his nadir, and am hopeful that he will improve, but will need to finish his course of IVIG.   Recommendations: 1) IVIG 0.4 mg/kg x5 doses, received 3/5 2) physical therapy 3) telemetry  Roland Rack, MD [facility] [phone]  If 7pm- 7am, please page neurology on call at [phone].

## 2013-07-24 NOTE — Progress Notes (Signed)
  [facility] PROGRESS NOTE  Kirkland Figg TYO:[id number] DOB: 05-Aug-1945 DOA: 07/21/2013 PCP: Deloria Lair, MD  Assessment/Plan:  Principal Problem:   GBS (Guillain Barre syndrome): on IVIG. improving Active Problems:   Atrial fibrillation remains rate controlled Recent right femur fx   Code Status:  full Family Communication:   Disposition Plan:  home  Consultants:  neuro  Procedures:     Antibiotics:    HPI/Subjective: Able to get OOB better today.  Objective: Filed Vitals:   07/24/13 1728  BP: 133/80  Pulse: 93  Temp: 98 F (36.7 C)  Resp: 20    Intake/Output Summary (Last 24 hours) at 07/24/13 2044 Last data filed at 07/24/13 1728  Gross per 24 hour  Intake    480 ml  Output   1925 ml  Net  -1445 ml   Filed Weights   07/21/13 2023  Weight: 86.2 kg (190 lb 0.6 oz)    Exam:   General:  comfortable  Cardiovascular: irreg irreg without MGR  Respiratory: cta without WRR  Abdomen: S, NT, ND  Ext:  No CCE  Neuro: slightly uncoordinated upper extremity movement. Legs stronger today  Basic Metabolic Panel:  Recent Labs Lab 07/21/13 1622 07/22/13 0822  NA 140  --   K 4.2  --   CL 103  --   CO2 25  --   GLUCOSE 103*  --   BUN 32*  --   CREATININE 1.21 1.10  CALCIUM 9.1  --    Liver Function Tests: No results found for this basename: AST, ALT, ALKPHOS, BILITOT, PROT, ALBUMIN,  in the last 168 hours No results found for this basename: LIPASE, AMYLASE,  in the last 168 hours No results found for this basename: AMMONIA,  in the last 168 hours CBC:  Recent Labs Lab 07/21/13 1622 07/22/13 0822  WBC 8.6 6.7  HGB 15.4 14.4  HCT 45.9 43.6  MCV 96.8 96.9  PLT 255 219   Cardiac Enzymes: No results found for this basename: CKTOTAL, CKMB, CKMBINDEX, TROPONINI,  in the last 168 hours BNP (last 3 results) No results found for this basename: PROBNP,  in the last 8760 hours CBG:  Recent Labs Lab 07/22/13 2157  07/23/13 0704 07/23/13 1136 07/23/13 1645 07/23/13 2209  GLUCAP 136* 100* 100* 94 88    No results found for this or any previous visit (from the past 240 hour(s)).   Studies: No results found.  Scheduled Meds: . allopurinol  300 mg Oral Daily  . aspirin EC  325 mg Oral Daily  . docusate sodium  100 mg Oral BID  . enoxaparin (LOVENOX) injection  40 mg Subcutaneous Q24H  . Immune Globulin 5%  400 mg/kg Intravenous Q24 Hr x 5  . metoprolol  100 mg Oral BID  . sodium chloride  3 mL Intravenous Q12H   Continuous Infusions:    Time spent: 15 minutes  [facility] Pager [phone]. If 7PM-7AM, please contact night-coverage at [email or web address], password [facility] 07/24/2013, 8:44 PM  LOS: 3 days

## 2013-07-24 NOTE — Progress Notes (Signed)
Pt HR in 160's-180's while up eating breakfast and up to the bathroom.  MD called, 100 mg PO Metroprolol ordered, HR parameters changed to >160 for CCMD. Pt asymptomatic and now back in bed.  HR now in 120's.  Pt resting and comfortable.

## 2013-07-25 MED ORDER — POLYETHYLENE GLYCOL 3350 17 G PO PACK
17.0000 g | PACK | Freq: Every day | ORAL | Status: DC
  Administered 2013-07-25 – 2013-07-26 (×2): 17 g via ORAL
  Filled 2013-07-25 (×2): qty 1

## 2013-07-25 NOTE — Progress Notes (Signed)
  [facility] PROGRESS NOTE  Asberry Lascola ZOX:[id number] DOB: [birth year] DOA: 07/21/2013 PCP: Deloria Lair, MD  Assessment/Plan:  Principal Problem:   GBS (Guillain Barre syndrome): on IVIG. Improving. Last dose IVIG tonight Active Problems:   Atrial fibrillation remains rate controlled Recent right femur fx  Home in am with PT/OT if ok with neuro  Code Status:  full Family Communication:   Disposition Plan:  home  Consultants:  neuro  Procedures:     Antibiotics:    HPI/Subjective: Still with some weakness and clumsiness  Objective: Filed Vitals:   07/25/13 2154  BP: 130/81  Pulse: 69  Temp: 98.5 F (36.9 C)  Resp: 20    Intake/Output Summary (Last 24 hours) at 07/25/13 2312 Last data filed at 07/25/13 1758  Gross per 24 hour  Intake   1090 ml  Output    800 ml  Net    290 ml   Filed Weights   07/21/13 2023  Weight: 86.2 kg (190 lb 0.6 oz)    Exam:   General:  comfortable  Cardiovascular: irreg irreg without MGR  Respiratory: cta without WRR  Abdomen: S, NT, ND  Ext:  No CCE  Neuro: about the same  Basic Metabolic Panel:  Recent Labs Lab 07/21/13 1622 07/22/13 0822  NA 140  --   K 4.2  --   CL 103  --   CO2 25  --   GLUCOSE 103*  --   BUN 32*  --   CREATININE 1.21 1.10  CALCIUM 9.1  --    Liver Function Tests: No results found for this basename: AST, ALT, ALKPHOS, BILITOT, PROT, ALBUMIN,  in the last 168 hours No results found for this basename: LIPASE, AMYLASE,  in the last 168 hours No results found for this basename: AMMONIA,  in the last 168 hours CBC:  Recent Labs Lab 07/21/13 1622 07/22/13 0822  WBC 8.6 6.7  HGB 15.4 14.4  HCT 45.9 43.6  MCV 96.8 96.9  PLT 255 219   Cardiac Enzymes: No results found for this basename: CKTOTAL, CKMB, CKMBINDEX, TROPONINI,  in the last 168 hours BNP (last 3 results) No results found for this basename: PROBNP,  in the last 8760 hours CBG:  Recent  Labs Lab 07/22/13 2157 07/23/13 0704 07/23/13 1136 07/23/13 1645 07/23/13 2209  GLUCAP 136* 100* 100* 94 88    No results found for this or any previous visit (from the past 240 hour(s)).   Studies: No results found.  Scheduled Meds: . allopurinol  300 mg Oral Daily  . aspirin EC  325 mg Oral Daily  . docusate sodium  100 mg Oral BID  . enoxaparin (LOVENOX) injection  40 mg Subcutaneous Q24H  . metoprolol  100 mg Oral BID  . polyethylene glycol  17 g Oral Daily  . sodium chloride  3 mL Intravenous Q12H   Continuous Infusions:    Time spent: 15 minutes  [facility] Pager [phone]. If 7PM-7AM, please contact night-coverage at [email or web address], password [facility] 07/25/2013, 11:12 PM  LOS: 4 days

## 2013-07-25 NOTE — Progress Notes (Signed)
NEURO HOSPITALIST PROGRESS NOTE   SUBJECTIVE:                                                                                                                        No new neurological complains but said that he is not noticing any difference in terms of the numbness of his legs and hands. Able to walk with a walker. IVIG 5/5 tonight. Denies shortness of breath or difficulty swallowing. No bowel movements in 4 days.   OBJECTIVE:                                                                                                                           Vital signs in last 24 hours: Temp:  [97.8 F (36.6 C)-98.5 F (36.9 C)] 98.2 F (36.8 C) (12/31 0606) Pulse Rate:  [76-93] 77 (12/31 0606) Resp:  [16-20] 18 (12/31 0606) BP: (127-147)/(69-95) 137/84 mmHg (12/31 0606) SpO2:  [97 %-100 %] 99 % (12/31 0606)  Intake/Output from previous day: 12/30 0701 - 12/31 0700 In: 480 [P.O.:480] Out: 1650 [Urine:1650] Intake/Output this shift:   Nutritional status: General  Past Medical History  Diagnosis Date  . Atrial flutter   . AF (atrial fibrillation)   . Gout     Neurologic Exam:  Mental Status: Alert, oriented, thought content appropriate.  Speech fluent without evidence of aphasia.  Able to follow 3 step commands without difficulty. Cranial Nerves: II: Discs flat bilaterally; Visual fields grossly normal, pupils equal, round, reactive to light and accommodation III,IV, VI: ptosis not present, extra-ocular motions intact bilaterally V,VII: smile symmetric, facial light touch sensation normal bilaterally VIII: hearing normal bilaterally IX,X: gag reflex present XI: bilateral shoulder shrug XII: midline tongue extension without atrophy or fasciculations  Motor: 5/5 upper extremities proximally but distal 4/5 interossei and 3/5 APB weakness. In his lower extremities, he has 4/5 dorsiflexion on the left 4 minus/5 on the right 4/5 knee flexion on  the left 4+/5 knee extension on left, 4 minus/5 knee extension and flexion on the right.  Tone and bulk:normal tone throughout; no atrophy noted Sensory: Pinprick and light touch intact throughout diminished up to mid calf. Deep Tendon Reflexes:  Absent. Plantars: Right: downgoing   Left: downgoing Cerebellar: normal finger-to-nose,  normal heel-to-shin test Gait:  No tested. CV: pulses palpable throughout    Lab Results: No results found for this basename: cbc, bmp, coags, chol, tri, ldl, hga1c   Lipid Panel No results found for this basename: CHOL, TRIG, HDL, CHOLHDL, VLDL, LDLCALC,  in the last 72 hours  Studies/Results: No results found.  MEDICATIONS                                                                                                                       I have reviewed the patient's current medications.  ASSESSMENT/PLAN:                                                                                                           GBS, last dose of IVIG tonight. Continue PT. Will follow up.  Dorian Pod, MD [facility] [phone]  07/25/2013, 8:41 AM

## 2013-07-25 NOTE — Progress Notes (Signed)
Physical Therapy Treatment Patient Details Name: Marcus [name] MRN: [id number] DOB: October 09, 1945 Today's Date: 07/25/2013 Time: 1040-1104 PT Time Calculation (min): 24 min  PT Assessment / Plan / Recommendation  History of Present Illness 67 year old male with ascending paresthesias, areflexia, and weakness with mildly elevated CSF protein and no pleocytosis. This constellation of symptoms is by far most consistent with Guillain-Barr syndrome. Pt is also 4 wks s/p IM nailing of R femur fx.   PT Comments   Patient continues to present with deficits in functional mobility.  Remains limited by HR elevation with activity in addition to weakness and sensory deficits. Ambulation with RW still at same distance today (no significant progression) and patient had difficulty perform functional activities for strengthening. Patient states that he does not feel any significant change in condition. Will continue to work with patient and progress as tolerated.   Follow Up Recommendations  Home health PT           Equipment Recommendations  None recommended by PT       Frequency Min 4X/week   Progress towards PT Goals Progress towards PT goals: Progressing toward goals (modest progression at this time)  Plan Current plan remains appropriate    Precautions / Restrictions Precautions Precautions: Fall Restrictions Weight Bearing Restrictions: Yes RLE Weight Bearing: Weight bearing as tolerated   Pertinent Vitals/Pain No pain    Mobility  Bed Mobility Bed Mobility: Supine to Sit;Sitting - Scoot to Edge of Bed;Sit to Supine Supine to Sit: 5: Supervision Sitting - Scoot to Edge of Bed: 5: Supervision Sit to Supine: 5: Supervision Details for Bed Mobility Assistance: Increased time to perform Transfers Transfers: Sit to Stand;Stand to Sit Sit to Stand: 4: Min guard Stand to Sit: 4: Min guard;With upper extremity assist;To bed Details for Transfer Assistance: VCs for hand placement,  assist to stabilize RW upon standing Ambulation/Gait Ambulation/Gait Assistance: 4: Min guard Ambulation Distance (Feet): 160 Feet Assistive device: Rolling walker Ambulation/Gait Assistance Details: continues to demonstrate elevated HR with ambulation and acitivity Gait Pattern: Step-through pattern;Left genu recurvatum;Antalgic Gait velocity: Decreased General Gait Details: Pt not with some genurecurvatum during ambulation    Exercises General Exercises - Lower Extremity Toe Raises: AROM;Strengthening;Both;15 reps;Standing Heel Raises: AROM;Strengthening;Both;15 reps;Standing Mini-Sqauts: AROM;Strengthening;Both;15 reps;Standing (at counter) Other Exercises Other Exercises: functional sit<>stand from low surface (used pillows placed in chair positioned facing side of the bed, used elevated rail to Pull to standing position x5 then fwd/backward leans and then seated rest      PT Goals (current goals can now be found in the care plan section) Acute Rehab PT Goals Patient Stated Goal: To return home with wife PT Goal Formulation: With patient Time For Goal Achievement: 07/03/13 Potential to Achieve Goals: Good  Visit Information  Last PT Received On: 07/25/13 Assistance Needed: +1 History of Present Illness: 67 year old male with ascending paresthesias, areflexia, and weakness with mildly elevated CSF protein and no pleocytosis. This constellation of symptoms is by far most consistent with Guillain-Barr syndrome. Pt is also 4 wks s/p IM nailing of R femur fx.    Subjective Data  Subjective: I still have all of the same numbness it hasn't changed Patient Stated Goal: To return home with wife   Cognition  Cognition Arousal/Alertness: Awake/alert Behavior During Therapy: WFL for tasks assessed/performed Overall Cognitive Status: Within Functional Limits for tasks assessed    Balance  Static Sitting Balance Static Sitting - Balance Support: Feet supported;No upper extremity  supported Static Sitting - Level of Assistance:  6: Modified independent (Device/Increase time) Static Standing Balance Static Standing - Balance Support: Bilateral upper extremity supported Static Standing - Level of Assistance: 5: Stand by assistance Static Standing - Comment/# of Minutes: using RW and counter during standing rest break  End of Session PT - End of Session Equipment Utilized During Treatment: Gait belt Activity Tolerance: Patient limited by fatigue Patient left: in bed;with call bell/phone within reach;with bed alarm set;with family/visitor present Nurse Communication: Mobility status   GP     [name] 07/25/2013, 11:15 AM Alben Deeds, [place] DPT  [phone]

## 2013-07-26 NOTE — Progress Notes (Signed)
Agree with PTA.    [place], [place]

## 2013-07-26 NOTE — Progress Notes (Signed)
Physical Therapy Treatment Patient Details Name: Marcus [name] MRN: [id number] DOB: April 23, 1946 Today's Date: 07/26/2013 Time: 4098-1191 PT Time Calculation (min): 28 min  PT Assessment / Plan / Recommendation  History of Present Illness 68 year old male with ascending paresthesias, areflexia, and weakness with mildly elevated CSF protein and no pleocytosis. This constellation of symptoms is by far most consistent with Guillain-Barr syndrome. Pt is also 4 wks s/p IM nailing of R femur fx.   PT Comments   Patient highly motivated and progressing well this session. Able to complete stair training this session. Patient does state that he has ramp to enter his house. At this time believe that patient would benefit most from [place] and will update POC to note. RN made aware. Patient was schedule for OPPT previously before admission for R leg.  Follow Up Recommendations  Outpatient PT     Does the patient have the potential to tolerate intense rehabilitation     Barriers to Discharge        Equipment Recommendations  None recommended by PT    Recommendations for Other Services    Frequency Min 4X/week   Progress towards PT Goals Progress towards PT goals: Progressing toward goals  Plan Discharge plan needs to be updated    Precautions / Restrictions Precautions Precautions: Fall Restrictions Weight Bearing Restrictions: Yes RLE Weight Bearing: Weight bearing as tolerated   Pertinent Vitals/Pain 4/10 leg pain. patient repositioned for comfort     Mobility  Bed Mobility Supine to Sit: 6: Modified independent (Device/Increase time) Transfers Sit to Stand: 5: Supervision Stand to Sit: 5: Supervision Ambulation/Gait Ambulation/Gait Assistance: 4: Min guard Ambulation Distance (Feet): 250 Feet Assistive device: Rolling walker Ambulation/Gait Assistance Details: Patient with increased reliance on RW with UEs. Very cautious of LE placement with gait Gait Pattern:  Step-through pattern;Left genu recurvatum Gait velocity: Decreased Stairs: Yes Stairs Assistance: 4: Min assist Stairs Assistance Details (indicate cue type and reason): Cues for techniqye and A for balance. Patient has ramp to enter house as well Stair Management Technique: Two rails;Forwards;Step to pattern    Exercises     PT Diagnosis:    PT Problem List:   PT Treatment Interventions:     PT Goals (current goals can now be found in the care plan section)    Visit Information  Last PT Received On: 07/26/13 Assistance Needed: +1 History of Present Illness: 68 year old male with ascending paresthesias, areflexia, and weakness with mildly elevated CSF protein and no pleocytosis. This constellation of symptoms is by far most consistent with Guillain-Barr syndrome. Pt is also 4 wks s/p IM nailing of R femur fx.    Subjective Data      Cognition  Cognition Arousal/Alertness: Awake/alert Behavior During Therapy: WFL for tasks assessed/performed Overall Cognitive Status: Within Functional Limits for tasks assessed    Balance     End of Session PT - End of Session Equipment Utilized During Treatment: Gait belt Activity Tolerance: Patient tolerated treatment well Patient left: with call bell/phone within reach;with family/visitor present;in chair Nurse Communication: Mobility status   GP     Jacqualyn Posey 07/26/2013, 10:16 AM 07/26/2013 Jacqualyn Posey PTA [phone] pager [phone] office

## 2013-07-26 NOTE — Discharge Summary (Signed)
Physician Discharge Summary  Marcus [name] UDJ:[id number] DOB: [birth year] DOA: 07/21/2013  PCP: Deloria Lair, MD  Admit date: 07/21/2013 Discharge date: 07/26/2013  Time spent: greater than 30 min  Discharge Diagnoses:  Principal Problem:   GBS (Guillain Barre syndrome) Active Problems:   Atrial fibrillation with RVR   Leg weakness   Discharge Condition: stable  Filed Weights   07/21/13 2023  Weight: 86.2 kg (190 lb 0.6 oz)    History of present illness:  68 y.o. male with PAF, h/o DM presented with bilateral progressive ascending paresthesias that began on the bottoms of his feet and ascended to the midcalf. He has become progressively weaker of his lower extremities and is currently unable to walk without assistance; denies other focal neuro symptoms, no fever, chills, no recent viral illness; no chest pain, no SOB, no cough, no headaches.  Hospital Course:  Admitted to hospitalists. Neurology consulted. Performed LP which showed mildly increased proteins, but otherwise unremarkable. Started on IVIG. Completed 5 day course.  Shows some improvement in strength. Still requiring walker and hand incoordination, but imrpoved from admission.  Home PT arranged.  Procedures:  LP  Consultations:  neurology  Discharge Exam: Filed Vitals:   07/26/13 1440  BP: 103/77  Pulse: 92  Temp: 98.3 F (36.8 C)  Resp: 18    Neuro: strength 4+/5 legs and feet. Hand strength normal.  Discharge Instructions  Discharge Orders   Future Orders Complete By Expires   Diet - low sodium heart healthy  As directed    Walker   As directed        Medication List         allopurinol 300 MG tablet  Commonly known as:  ZYLOPRIM  Take 300 mg by mouth daily.     aspirin EC 325 MG tablet  Take 1 tablet (325 mg total) by mouth daily.     DSS 100 MG Caps  Take 100 mg by mouth 2 (two) times daily.     metoprolol 50 MG tablet  Commonly known as:  LOPRESSOR  Take 75 mg by  mouth 2 (two) times daily.       No Known Allergies     Follow-up Information   Follow up with TAPPER,DAVID B, MD In 1 week.   Specialty:  Family Medicine   Contact information:   [address] [place] [address] [phone]       Follow up with [facility], KOFI, MD In 4 weeks.   Specialty:  Neurology   Contact information:   [address] [place] [address] [phone]        The results of significant diagnostics from this hospitalization (including imaging, microbiology, ancillary and laboratory) are listed below for reference.    Significant Diagnostic Studies: Dg Femur Right  06/27/2013   CLINICAL DATA:  Postop femur fracture.  EXAM: RIGHT FEMUR - 2 VIEW  COMPARISON:  Fluoroscopic spot film from June 25, 2013 and preoperative films of June 25, 2013  FINDINGS: The patient has undergone placement of an intra medullary rod for the comminuted angulated fracture at the junction of the middle and distal thirds of the right femoral shaft alignment of the femur is now near anatomic. There is a fracture fragment that is distracted from the underlying bone by approximately 1 cm. The observed portions of the hip and knee exhibit no acute abnormalities.  IMPRESSION: The patient has undergone ORIF with placement of an intra medullary rod for a fracture  of the distal aspect of the shaft of the right femur.   Electronically Signed   By: David  Martinique   On: 06/27/2013 12:47   Ct Head Wo Contrast  07/21/2013   CLINICAL DATA:  Numbness in both legs for 4 days.  Recent fall.  EXAM: CT HEAD WITHOUT CONTRAST  TECHNIQUE: Contiguous axial images were obtained from the base of the skull through the vertex without intravenous contrast.  COMPARISON:  None.  FINDINGS: The ventricles are normal in configuration. There is mild ventricular and sulcal enlargement reflecting age related volume loss.  There are no parenchymal masses or mass effect. There is no evidence of a cortical  infarct.  There are no extra-axial masses or abnormal fluid collections.  There is no intracranial hemorrhage.  The visualized sinuses and mastoid air cells are clear.  IMPRESSION: No acute intracranial abnormalities.  Age related volume loss.  Exam otherwise unremarkable.   Electronically Signed   By: Lajean Manes M.D.   On: 07/21/2013 17:31   Mr Cervical Spine Wo Contrast  07/21/2013   CLINICAL DATA:  Five-day history of bilateral hand and foot numbness  EXAM: MRI CERVICAL SPINE WITHOUT CONTRAST  TECHNIQUE: Multiplanar, multisequence MR imaging was performed. No intravenous contrast was administered.  COMPARISON:  None.  FINDINGS: Image quality degraded by mild motion.  Negative for fracture or mass. Cervical alignment is normal. Spinal cord signal is normal. Craniocervical junction is normal.  C2-3:  Negative  C3-4:  Mild degenerative change  C4-5:  Mild degenerative change  C5-6: Mild degenerative change with mild spurring. No significant spinal stenosis  C6-7: Early spondylosis with mild foraminal narrowing bilaterally. No significant spinal stenosis  C7-T1:  Negative  IMPRESSION: Mild cervical degenerative changes without significant spinal stenosis. Mild spondylosis at C6-7 with mild foraminal narrowing bilaterally.   Electronically Signed   By: Franchot Gallo M.D.   On: 07/21/2013 20:09    Microbiology: No results found for this or any previous visit (from the past 240 hour(s)).   Labs: Basic Metabolic Panel:  Recent Labs Lab 07/21/13 1622 07/22/13 0822  NA 140  --   K 4.2  --   CL 103  --   CO2 25  --   GLUCOSE 103*  --   BUN 32*  --   CREATININE 1.21 1.10  CALCIUM 9.1  --    Liver Function Tests: No results found for this basename: AST, ALT, ALKPHOS, BILITOT, PROT, ALBUMIN,  in the last 168 hours No results found for this basename: LIPASE, AMYLASE,  in the last 168 hours No results found for this basename: AMMONIA,  in the last 168 hours CBC:  Recent Labs Lab  07/21/13 1622 07/22/13 0822  WBC 8.6 6.7  HGB 15.4 14.4  HCT 45.9 43.6  MCV 96.8 96.9  PLT 255 219   Cardiac Enzymes: No results found for this basename: CKTOTAL, CKMB, CKMBINDEX, TROPONINI,  in the last 168 hours BNP: BNP (last 3 results) No results found for this basename: PROBNP,  in the last 8760 hours CBG:  Recent Labs Lab 07/22/13 2157 07/23/13 0704 07/23/13 1136 07/23/13 1645 07/23/13 2209  GLUCAP 136* 100* 100* 94 88       Signed:  Laurel L  [facility] 07/26/2013, 2:57 PM

## 2013-07-26 NOTE — Progress Notes (Signed)
Dr. Conley Canal working on discharge summary.  Patient aware of impending discharge and his IV has been removed.  Patient is working on getting a ride home.  Will continue to monitor.   Kizzie Bane, RN

## 2013-10-19 ENCOUNTER — Other Ambulatory Visit: Payer: Self-pay | Admitting: Internal Medicine

## 2014-06-11 ENCOUNTER — Encounter (INDEPENDENT_AMBULATORY_CARE_PROVIDER_SITE_OTHER): Payer: Self-pay

## 2014-06-11 ENCOUNTER — Encounter (INDEPENDENT_AMBULATORY_CARE_PROVIDER_SITE_OTHER): Payer: Self-pay | Admitting: *Deleted

## 2014-06-27 ENCOUNTER — Other Ambulatory Visit (INDEPENDENT_AMBULATORY_CARE_PROVIDER_SITE_OTHER): Payer: Self-pay | Admitting: *Deleted

## 2014-06-27 DIAGNOSIS — Z800 Family history of malignant neoplasm of digestive organs: Secondary | ICD-10-CM

## 2014-07-30 ENCOUNTER — Telehealth (INDEPENDENT_AMBULATORY_CARE_PROVIDER_SITE_OTHER): Payer: Self-pay | Admitting: *Deleted

## 2014-07-30 DIAGNOSIS — Z1211 Encounter for screening for malignant neoplasm of colon: Secondary | ICD-10-CM

## 2014-07-30 NOTE — Telephone Encounter (Signed)
Patient needs movi prep 

## 2014-08-01 ENCOUNTER — Ambulatory Visit (INDEPENDENT_AMBULATORY_CARE_PROVIDER_SITE_OTHER): Payer: Medicare Other | Admitting: Internal Medicine

## 2014-08-01 ENCOUNTER — Encounter: Payer: Self-pay | Admitting: Internal Medicine

## 2014-08-01 VITALS — BP 118/60 | HR 82 | Ht 72.0 in | Wt 193.2 lb

## 2014-08-01 DIAGNOSIS — I4891 Unspecified atrial fibrillation: Secondary | ICD-10-CM

## 2014-08-01 DIAGNOSIS — R29898 Other symptoms and signs involving the musculoskeletal system: Secondary | ICD-10-CM

## 2014-08-01 DIAGNOSIS — G610 Guillain-Barre syndrome: Secondary | ICD-10-CM

## 2014-08-01 NOTE — Assessment & Plan Note (Signed)
He appears to be neurologically intact. No additional treatment at this time.

## 2014-08-01 NOTE — Patient Instructions (Signed)
Your physician wants you to follow-up in: 12 months with Dr. Taylor. You will receive a reminder letter in the mail two months in advance. If you don't receive a letter, please call our office to schedule the follow-up appointment.    

## 2014-08-01 NOTE — Assessment & Plan Note (Signed)
His ventricular rate is now well-controlled. He will continue his current medical therapy.

## 2014-08-01 NOTE — Assessment & Plan Note (Signed)
He is currently almost back to full strength. He is encouraged to increase his physical activity.

## 2014-08-01 NOTE — Progress Notes (Addendum)
HPI [name] returns today for followup. He is a very pleasant 69 year old man with hypertension and chronic atrial fibrillation. In the interim he has had multiple problems. 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 He had to have surgical repair. He then developed Pincus Badder barr syndrome. After a long hospital course, he was eventually discharged and has been stable in the interim. He has not had syncope. He has had no symptomatic atrial fibrillation. He denies chest pain, shortness of breath, or peripheral edema. No Known Allergies   Current Outpatient Prescriptions  Medication Sig Dispense Refill  . allopurinol (ZYLOPRIM) 300 MG tablet Take 300 mg by mouth daily.    Marland Kitchen aspirin EC 325 MG tablet Take 1 tablet (325 mg total) by mouth daily. 30 tablet 0  . metoprolol (LOPRESSOR) 50 MG tablet TAKE 1 & 1/2 TABLETS BY MOUTH TWICE DAILY. 90 tablet 9   No current facility-administered medications for this visit.     Past Medical History  Diagnosis Date  . Atrial flutter   . AF (atrial fibrillation)   . Gout     ROS:   All systems reviewed and negative except as noted in the HPI.   Past Surgical History  Procedure Laterality Date  . Catheter ablation  12/23/2004  . Femur im nail Right 06/25/2013    Procedure: INTRAMEDULLARY (IM) RETROGRADE FEMORAL NAILING;  Surgeon: Marybelle Killings, MD;  Location: [facility];  Service: Orthopedics;  Laterality: Right;     Family History  Problem Relation Age of Onset  . Diabetes Father      History   Social History  . Marital Status: Married    Spouse Name: N/A    Number of Children: N/A  . Years of Education: N/A   Occupational History  . Not on file.   Social History Main Topics  . Smoking status: Never Smoker   . Smokeless tobacco: Not on file  . Alcohol Use: No  . Drug Use: No  . Sexual Activity: Not on file   Other Topics Concern  . Not on file   Social History Narrative     BP  118/60 mmHg  Pulse 82  Ht 6' (1.829 m)  Wt 193 lb 3.2 oz (87.635 kg)  BMI 26.20 kg/m2  Physical Exam:  Well appearing 69 year old man, NAD HEENT: Unremarkable Neck:  No JVD, no thyromegally Lungs:  Clear with no wheezes, rales, or rhonchi. HEART:  IRegular rate rhythm, no murmurs, no rubs, no clicks Abd:  soft, positive bowel sounds, no organomegally, no rebound, no guarding Ext:  2 plus pulses, no edema, no cyanosis, no clubbing Skin:  No rashes no nodules Neuro:  CN II through XII intact, motor grossly intact  ECG - atrial fibrillation with a controlled ventricular response Assess/Plan:

## 2014-08-02 MED ORDER — PEG-KCL-NACL-NASULF-NA ASC-C 100 G PO SOLR: 1.0000 | Freq: Once | ORAL | Status: DC

## 2014-08-20 ENCOUNTER — Other Ambulatory Visit: Payer: Self-pay | Admitting: Internal Medicine

## 2014-08-20 ENCOUNTER — Telehealth (INDEPENDENT_AMBULATORY_CARE_PROVIDER_SITE_OTHER): Payer: Self-pay | Admitting: *Deleted

## 2014-08-20 NOTE — Telephone Encounter (Signed)
agree

## 2014-08-20 NOTE — Telephone Encounter (Signed)
Referring MD/PCP: tapper   Procedure: tcs  Reason/Indication:  fam hx colon ca  Has patient had this procedure before?  Yes, 2010, scanned  If so, when, by whom and where?    Is there a family history of colon cancer?  Yes, father & mother  Who?  What age when diagnosed?    Is patient diabetic?   pre      Does patient have prosthetic heart valve?  no  Do you have a pacemaker?  no  Has patient ever had endocarditis? no  Has patient had joint replacement within last 12 months?  no  Does patient tend to be constipated or take laxatives? no  Is patient on Coumadin, Plavix and/or Aspirin? yes  Medications: asa 325 mg daily, metoprolol 50 mg 1 1/2 tab bid, allopurinol 300 mg daily,   Allergies: nkda  Medication Adjustment: asa 2 days  Procedure date & time: 09/18/14 at 830

## 2014-09-18 ENCOUNTER — Encounter (HOSPITAL_COMMUNITY): Payer: Self-pay | Admitting: *Deleted

## 2014-09-18 ENCOUNTER — Encounter (HOSPITAL_COMMUNITY)
Admission: RE | Disposition: A | Discharge status: Home / Self Care | Payer: Self-pay | Source: Ambulatory Visit | Attending: Internal Medicine

## 2014-09-18 ENCOUNTER — Ambulatory Visit (HOSPITAL_COMMUNITY)
Admission: RE | Admit: 2014-09-18 | Discharge: 2014-09-18 | Disposition: A | Discharge status: Home / Self Care | Payer: Medicare Other | Source: Ambulatory Visit | Attending: Internal Medicine | Admitting: Internal Medicine

## 2014-09-18 DIAGNOSIS — Z1211 Encounter for screening for malignant neoplasm of colon: Secondary | ICD-10-CM | POA: Diagnosis not present

## 2014-09-18 DIAGNOSIS — Z87891 Personal history of nicotine dependence: Secondary | ICD-10-CM | POA: Insufficient documentation

## 2014-09-18 DIAGNOSIS — K648 Other hemorrhoids: Secondary | ICD-10-CM

## 2014-09-18 DIAGNOSIS — I4891 Unspecified atrial fibrillation: Secondary | ICD-10-CM | POA: Insufficient documentation

## 2014-09-18 DIAGNOSIS — Z79899 Other long term (current) drug therapy: Secondary | ICD-10-CM | POA: Insufficient documentation

## 2014-09-18 DIAGNOSIS — M109 Gout, unspecified: Secondary | ICD-10-CM | POA: Diagnosis not present

## 2014-09-18 DIAGNOSIS — Z7982 Long term (current) use of aspirin: Secondary | ICD-10-CM | POA: Diagnosis not present

## 2014-09-18 DIAGNOSIS — K644 Residual hemorrhoidal skin tags: Secondary | ICD-10-CM | POA: Diagnosis not present

## 2014-09-18 DIAGNOSIS — D122 Benign neoplasm of ascending colon: Secondary | ICD-10-CM | POA: Diagnosis not present

## 2014-09-18 DIAGNOSIS — K219 Gastro-esophageal reflux disease without esophagitis: Secondary | ICD-10-CM | POA: Insufficient documentation

## 2014-09-18 DIAGNOSIS — D123 Benign neoplasm of transverse colon: Secondary | ICD-10-CM | POA: Insufficient documentation

## 2014-09-18 DIAGNOSIS — Z800 Family history of malignant neoplasm of digestive organs: Secondary | ICD-10-CM

## 2014-09-18 HISTORY — DX: Gastro-esophageal reflux disease without esophagitis: K21.9

## 2014-09-18 HISTORY — PX: COLONOSCOPY: SHX5424

## 2014-09-18 SURGERY — COLONOSCOPY
Anesthesia: Moderate Sedation

## 2014-09-18 MED ORDER — MIDAZOLAM HCL 5 MG/5ML IJ SOLN
INTRAMUSCULAR | Status: AC
  Filled 2014-09-18: qty 10

## 2014-09-18 MED ORDER — MEPERIDINE HCL 50 MG/ML IJ SOLN
INTRAMUSCULAR | Status: DC | PRN
  Administered 2014-09-18 (×2): 25 mg via INTRAVENOUS

## 2014-09-18 MED ORDER — SODIUM CHLORIDE 0.9 % IV SOLN
INTRAVENOUS | Status: DC
  Administered 2014-09-18: 08:00:00 via INTRAVENOUS

## 2014-09-18 MED ORDER — MIDAZOLAM HCL 5 MG/5ML IJ SOLN
INTRAMUSCULAR | Status: DC | PRN
  Administered 2014-09-18 (×2): 2 mg via INTRAVENOUS

## 2014-09-18 MED ORDER — MEPERIDINE HCL 50 MG/ML IJ SOLN
INTRAMUSCULAR | Status: AC
  Filled 2014-09-18: qty 1

## 2014-09-18 NOTE — Op Note (Signed)
COLONOSCOPY PROCEDURE REPORT  PATIENT:  Marcus [name]  MR#:  [id number] Birthdate:  10-16-1945, 69 y.o., male Endoscopist:  Dr. Rogene Houston, MD Referred By:  Dr. Dr. Deloria Lair, MD Procedure Date: 09/18/2014  Procedure:   Colonoscopy  Indications:  Patient is 69 year old Caucasian male was undergoing high risk screening colonoscopy. Last exam was in November 2010 was normal. Patient's father has surgery for colon carcinoma at age 42 and left several years and mother had CRC at age 7 and died within 2 weeks.  Informed Consent:  The procedure and risks were reviewed with the patient and informed consent was obtained.  Medications:  Demerol 50 mg IV Versed 4 mg IV  Description of procedure:  After a digital rectal exam was performed, that colonoscope was advanced from the anus through the rectum and colon to the area of the cecum, ileocecal valve and appendiceal orifice. The cecum was deeply intubated. These structures were well-seen and photographed for the record. From the level of the cecum and ileocecal valve, the scope was slowly and cautiously withdrawn. The mucosal surfaces were carefully surveyed utilizing scope tip to flexion to facilitate fold flattening as needed. The scope was pulled down into the rectum where a thorough exam including retroflexion was performed.  Findings:   Prep excellent. Small polyp at ascending colon was cold snared. Residual piece was removed with cold biopsy forceps. 5 mm polyp cold snared from hepatic flexure. Mucosa of  rest of the colon and rectum was normal. Small hemorrhoids below the dentate line.   Therapeutic/Diagnostic Maneuvers Performed:  See above  Complications:  None  Cecal Withdrawal Time:  12 minutes  Impression:  Examination performed to cecum. Small polyp at ascending colon was removed using cold snare and biopsy. Small polyp at hepatic flexure was cold snared. Both polyps were submitted together. Small  external hemorrhoids.  Recommendations:  Standard instructions given. I will contact patient with biopsy results and further recommendations.  Buelah Rennie U  09/18/2014 9:15 AM  CC: Dr. Deloria Lair, MD & Dr. Rayne Du ref. provider found

## 2014-09-18 NOTE — H&P (Signed)
Marcus [name] is an 69 y.o. male.   Chief Complaint: Patient is here for colonoscopy. HPI: Patient is 69 year old Caucasian male who is here for screening colonoscopy. He denies abdominal pain change in bowel habits or rectal bleeding. Last colonoscopy was normal in November 2010. Family history is positive for colon carcinoma in father who was 67 at the time of diagnosis and the 16 years. Mother died of colon carcinoma at age 68 within 2 weeks of diagnosis.   Past Medical History  Diagnosis Date  . Atrial flutter   . AF (atrial fibrillation)   . Gout   . GERD (gastroesophageal reflux disease)     Past Surgical History  Procedure Laterality Date  . Catheter ablation  12/23/2004  . Femur im nail Right 06/25/2013    Procedure: INTRAMEDULLARY (IM) RETROGRADE FEMORAL NAILING;  Surgeon: Marybelle Killings, MD;  Location: [facility];  Service: Orthopedics;  Laterality: Right;    Family History  Problem Relation Age of Onset  . Diabetes Father   . Colon cancer Father   . Colon cancer Mother    Social History:  reports that he has quit smoking. His smoking use included Cigarettes. He has a 10 pack-year smoking history. He does not have any smokeless tobacco history on file. He reports that he does not drink alcohol or use illicit drugs.  Allergies: No Known Allergies  Medications Prior to Admission  Medication Sig Dispense Refill  . metoprolol (LOPRESSOR) 50 MG tablet TAKE 1 & 1/2 TABLETS BY MOUTH TWICE DAILY. 90 tablet 10  . peg 3350 powder (MOVIPREP) 100 G SOLR Take 1 kit (200 g total) by mouth once. 1 kit 0  . allopurinol (ZYLOPRIM) 300 MG tablet Take 300 mg by mouth daily.    Marland Kitchen aspirin EC 325 MG tablet Take 1 tablet (325 mg total) by mouth daily. 30 tablet 0    No results found for this or any previous visit (from the past 48 hour(s)). No results found.  ROS  Blood pressure 160/84, pulse 100, temperature 97.6 F (36.4 C), temperature source Oral, height 6' (1.829 m), weight  185 lb (83.915 kg), SpO2 97 %. Physical Exam  Constitutional: He appears well-developed and well-nourished.  HENT:  Mouth/Throat: Oropharynx is clear and moist.  Eyes: Conjunctivae are normal. No scleral icterus.  Neck: No thyromegaly present.  Cardiovascular:  Irregular rhythm normal S1 and S2. No murmur or gallop noted.  Respiratory: Effort normal and breath sounds normal.  GI: Soft. He exhibits no distension and no mass. There is no tenderness.  Musculoskeletal: He exhibits no edema.  Lymphadenopathy:    He has no cervical adenopathy.  Skin: Skin is warm and dry.     Assessment/Plan High risk screening colonoscopy. Family history of CRC in both parents.  [name] U 09/18/2014, 8:38 AM

## 2014-09-18 NOTE — Discharge Instructions (Signed)
Resume usual medications and diet. No driving for 24 hours. Physician will call with biopsy results. Next colonoscopy in 5 years.  Colonoscopy, Care After Refer to this sheet in the next few weeks. These instructions provide you with information on caring for yourself after your procedure. Your health care provider may also give you more specific instructions. Your treatment has been planned according to current medical practices, but problems sometimes occur. Call your health care provider if you have any problems or questions after your procedure. WHAT TO EXPECT AFTER THE PROCEDURE  After your procedure, it is typical to have the following:  A small amount of blood in your stool.  Moderate amounts of gas and mild abdominal cramping or bloating. HOME CARE INSTRUCTIONS  Do not drive, operate machinery, or sign important documents for 24 hours.  You may shower and resume your regular physical activities, but move at a slower pace for the first 24 hours.  Take frequent rest periods for the first 24 hours.  Walk around or put a warm pack on your abdomen to help reduce abdominal cramping and bloating.  Drink enough fluids to keep your urine clear or pale yellow.  You may resume your normal diet as instructed by your health care provider. Avoid heavy or fried foods that are hard to digest.  Avoid drinking alcohol for 24 hours or as instructed by your health care provider.  Only take over-the-counter or prescription medicines as directed by your health care provider.  If a tissue sample (biopsy) was taken during your procedure:  Do not take aspirin or blood thinners for 7 days, or as instructed by your health care provider.  Do not drink alcohol for 7 days, or as instructed by your health care provider.  Eat soft foods for the first 24 hours. SEEK MEDICAL CARE IF: You have persistent spotting of blood in your stool 2-3 days after the procedure. SEEK IMMEDIATE MEDICAL CARE IF:  You  have more than a small spotting of blood in your stool.  You pass large blood clots in your stool.  Your abdomen is swollen (distended).  You have nausea or vomiting.  You have a fever.  You have increasing abdominal pain that is not relieved with medicine. Document Released: 02/24/2004 Document Revised: 05/02/2013 Document Reviewed: 03/19/2013 [facility] Patient Information 2015 [place], [place]. This information is not intended to replace advice given to you by your health care provider. Make sure you discuss any questions you have with your health care provider.

## 2014-09-19 ENCOUNTER — Encounter (HOSPITAL_COMMUNITY): Payer: Self-pay | Admitting: Internal Medicine

## 2014-09-30 ENCOUNTER — Encounter (INDEPENDENT_AMBULATORY_CARE_PROVIDER_SITE_OTHER): Payer: Self-pay | Admitting: *Deleted

## 2015-08-13 ENCOUNTER — Other Ambulatory Visit: Payer: Self-pay | Admitting: Internal Medicine

## 2015-09-12 ENCOUNTER — Other Ambulatory Visit: Payer: Self-pay | Admitting: Internal Medicine

## 2015-09-27 ENCOUNTER — Other Ambulatory Visit: Payer: Self-pay | Admitting: Internal Medicine

## 2015-09-29 ENCOUNTER — Other Ambulatory Visit: Payer: Self-pay | Admitting: *Deleted

## 2015-09-29 MED ORDER — METOPROLOL TARTRATE 50 MG PO TABS: ORAL_TABLET | ORAL | Status: DC

## 2015-09-29 NOTE — Telephone Encounter (Signed)
Rx has been sent  

## 2015-10-28 ENCOUNTER — Encounter: Payer: Self-pay | Admitting: Internal Medicine

## 2015-10-28 ENCOUNTER — Other Ambulatory Visit: Payer: Self-pay | Admitting: *Deleted

## 2015-10-28 ENCOUNTER — Ambulatory Visit (INDEPENDENT_AMBULATORY_CARE_PROVIDER_SITE_OTHER): Payer: Medicare Other | Admitting: Internal Medicine

## 2015-10-28 VITALS — BP 134/78 | HR 88 | Ht 72.0 in | Wt 195.8 lb

## 2015-10-28 DIAGNOSIS — I4891 Unspecified atrial fibrillation: Secondary | ICD-10-CM

## 2015-10-28 MED ORDER — RIVAROXABAN 20 MG PO TABS
20.0000 mg | ORAL_TABLET | Freq: Every day | ORAL | Status: DC
Start: 2015-10-28 — End: 2015-11-06

## 2015-10-28 MED ORDER — APIXABAN 5 MG PO TABS: 5.0000 mg | ORAL_TABLET | Freq: Two times a day (BID) | ORAL | Status: DC

## 2015-10-28 NOTE — Telephone Encounter (Signed)
[facility] called and stated that the patients insurance does not cover eliquis. Pradaxa and xarelto are preferred. Please advise. Thanks, MI

## 2015-10-28 NOTE — Progress Notes (Signed)
HPI Marcus [name] returns today for followup. He is a very pleasant 70 year old man with hypertension and chronic atrial fibrillation. In the interim he has been stable. His broken leg is "95%"   He has not had syncope. He has benn mostly asymptomatic with his atrial fibrillation. He denies chest pain, shortness of breath, or peripheral edema. No Known Allergies   Current Outpatient Prescriptions  Medication Sig Dispense Refill  . allopurinol (ZYLOPRIM) 300 MG tablet Take 300 mg by mouth daily.    Marland Kitchen aspirin EC 325 MG tablet Take 1 tablet (325 mg total) by mouth daily. 30 tablet 0  . metoprolol (LOPRESSOR) 50 MG tablet TAKE 1 & 1/2 TABLETS BY MOUTH TWICE DAILY. 90 tablet 0   No current facility-administered medications for this visit.     Past Medical History  Diagnosis Date  . Atrial flutter ([place])   . AF (atrial fibrillation) ([place])   . Gout   . GERD (gastroesophageal reflux disease)     ROS:   All systems reviewed and negative except as noted in the HPI.   Past Surgical History  Procedure Laterality Date  . Catheter ablation  12/23/2004  . Femur im nail Right 06/25/2013    Procedure: INTRAMEDULLARY (IM) RETROGRADE FEMORAL NAILING;  Surgeon: Marybelle Killings, MD;  Location: [facility];  Service: Orthopedics;  Laterality: Right;  . Colonoscopy N/A 09/18/2014    Procedure: COLONOSCOPY;  Surgeon: Rogene Houston, MD;  [facility];  Service: Endoscopy;  Laterality: N/A;  40     Family History  Problem Relation Age of Onset  . Diabetes Father   . Colon cancer Father   . Colon cancer Mother      Social History   Social History  . Marital Status: Married    Spouse Name: N/A  . Number of Children: N/A  . Years of Education: N/A   Occupational History  . Not on file.   Social History Main Topics  . Smoking status: Former Smoker -- 2.00 packs/day for 5 years    Types: Cigarettes  . Smokeless tobacco: Not on file  . Alcohol Use: No  . Drug Use: No  . Sexual  Activity: Not on file   Other Topics Concern  . Not on file   Social History Narrative     BP 134/78 mmHg  Pulse 88  Ht 6' (1.829 m)  Wt 195 lb 12.8 oz (88.814 kg)  BMI 26.55 kg/m2  Physical Exam:  Well appearing 70 year old man, NAD HEENT: Unremarkable Neck:  6 cm JVD, no thyromegally Lungs:  Clear with no wheezes, rales, or rhonchi. HEART:  IRegular rate rhythm, no murmurs, no rubs, no clicks Abd:  soft, positive bowel sounds, no organomegally, no rebound, no guarding Ext:  2 plus pulses, no edema, no cyanosis, no clubbing Skin:  No rashes no nodules Neuro:  CN II through XII intact, motor grossly intact  ECG - atrial fibrillation with a controlled ventricular response  Assess/Plan:  1. Atrial fib - he has refused anti-coagulation in the past. We will start Eliquis and stop his ASA. He will continue rate control with beta blockers. 2. Coags - we will start systemic anti-coagulation. 3. HTN - his blood pressure is reasonably well controlled. Will follow.  [name].D.

## 2015-10-28 NOTE — Telephone Encounter (Signed)
Dr Lovena Le said we could change to xarelto 20 mg daily

## 2015-10-28 NOTE — Patient Instructions (Signed)
Medication Instructions:  Your physician has recommended you make the following change in your medication:  1) Stop Aspirin 2) Start Eliquis 5 mg twice daily   Labwork: None ordered   Testing/Procedures: None ordered   Follow-Up: Your physician wants you to follow-up in: 12 months with Dr Knox Saliva will receive a reminder letter in the mail two months in advance. If you don't receive a letter, please call our office to schedule the follow-up appointment.   Any Other Special Instructions Will Be Listed Below (If Applicable).     If you need a refill on your cardiac medications before your next appointment, please call your pharmacy.

## 2015-11-06 ENCOUNTER — Telehealth: Payer: Self-pay | Admitting: Internal Medicine

## 2015-11-06 MED ORDER — ASPIRIN EC 325 MG PO TBEC: 325.0000 mg | DELAYED_RELEASE_TABLET | Freq: Every day | ORAL | Status: DC

## 2015-11-06 NOTE — Telephone Encounter (Signed)
The medication was changed to Xarelto as his insurance would not cover Eliquis  I have left the patient a message that I have changed his medication list back to ASA 325 mg daily.  He is at greater risk for having a stroke not being on blood thiner.  He has refused in the past

## 2015-11-06 NOTE — Telephone Encounter (Signed)
New Message:  Pt called in wanting to notify Dr. Lovena Le that he will continue to take the Aspirin and will not be taking that Eliquis. Please f/u with pt if you need to.

## 2015-11-12 ENCOUNTER — Other Ambulatory Visit: Payer: Self-pay | Admitting: Internal Medicine

## 2016-04-09 ENCOUNTER — Other Ambulatory Visit: Payer: Self-pay | Admitting: Internal Medicine

## 2016-09-15 DIAGNOSIS — G610 Guillain-Barre syndrome: Secondary | ICD-10-CM | POA: Diagnosis not present

## 2016-09-15 DIAGNOSIS — Z0000 Encounter for general adult medical examination without abnormal findings: Secondary | ICD-10-CM | POA: Diagnosis not present

## 2016-09-15 DIAGNOSIS — E790 Hyperuricemia without signs of inflammatory arthritis and tophaceous disease: Secondary | ICD-10-CM | POA: Diagnosis not present

## 2016-09-15 DIAGNOSIS — Z6826 Body mass index (BMI) 26.0-26.9, adult: Secondary | ICD-10-CM | POA: Diagnosis not present

## 2016-09-15 DIAGNOSIS — E119 Type 2 diabetes mellitus without complications: Secondary | ICD-10-CM | POA: Diagnosis not present

## 2016-09-15 DIAGNOSIS — I482 Chronic atrial fibrillation: Secondary | ICD-10-CM | POA: Diagnosis not present

## 2016-10-07 ENCOUNTER — Other Ambulatory Visit: Payer: Self-pay | Admitting: Internal Medicine

## 2016-12-06 ENCOUNTER — Other Ambulatory Visit: Payer: Self-pay | Admitting: Internal Medicine

## 2016-12-22 ENCOUNTER — Ambulatory Visit (INDEPENDENT_AMBULATORY_CARE_PROVIDER_SITE_OTHER): Payer: Medicare Other | Admitting: Internal Medicine

## 2016-12-22 ENCOUNTER — Encounter: Payer: Self-pay | Admitting: Internal Medicine

## 2016-12-22 VITALS — BP 122/84 | HR 96 | Ht 72.0 in | Wt 195.0 lb

## 2016-12-22 DIAGNOSIS — I4891 Unspecified atrial fibrillation: Secondary | ICD-10-CM

## 2016-12-22 MED ORDER — METOPROLOL TARTRATE 100 MG PO TABS: 100.0000 mg | ORAL_TABLET | Freq: Two times a day (BID) | ORAL | 11 refills | Status: DC

## 2016-12-22 MED ORDER — ASPIRIN EC 81 MG PO TBEC: 81.0000 mg | DELAYED_RELEASE_TABLET | Freq: Every day | ORAL | 3 refills | Status: AC

## 2016-12-22 NOTE — Patient Instructions (Addendum)
Medication Instructions:  Your physician has recommended you make the following change in your medication:   INCREASE Metoprolol Tartrate to 100 mg twice a day STOP Aspirin 325 START Aspirin 81 mg daily  Labwork: None Ordered   Testing/Procedures: None Ordered   Follow-Up: Your physician wants you to follow-up in: 1 year with Dr. Lovena Le. You will receive a reminder letter in the mail two months in advance. If you don't receive a letter, please call our office to schedule the follow-up appointment.     Any Other Special Instructions Will Be Listed Below (If Applicable).     If you need a refill on your cardiac medications before your next appointment, please call your pharmacy.

## 2016-12-22 NOTE — Progress Notes (Signed)
HPI Marcus [name] returns today for followup. He is a very pleasant 71 year old man with hypertension and chronic atrial fibrillation. In the interim he has been stable. He continues to work as a Photographer. He admits to dyspnea with exertion. He also has chronic leg pain. He does not feel palpitations.    No Known Allergies   Current Outpatient Prescriptions  Medication Sig Dispense Refill  . allopurinol (ZYLOPRIM) 300 MG tablet Take 300 mg by mouth daily.    Marland Kitchen aspirin EC 325 MG tablet Take 1 tablet (325 mg total) by mouth daily. 30 tablet 0  . metoprolol tartrate (LOPRESSOR) 50 MG tablet Take 1.5 tablets (75 mg total) by mouth 2 (two) times daily. 90 tablet 1   No current facility-administered medications for this visit.      Past Medical History:  Diagnosis Date  . AF (atrial fibrillation) ([place])   . Atrial flutter ([place])   . GERD (gastroesophageal reflux disease)   . Gout     ROS:   All systems reviewed and negative except as noted in the HPI.   Past Surgical History:  Procedure Laterality Date  . catheter ablation  12/23/2004  . COLONOSCOPY N/A 09/18/2014   Procedure: COLONOSCOPY;  Surgeon: Rogene Houston, MD;  [facility];  Service: Endoscopy;  Laterality: N/A;  830  . FEMUR IM NAIL Right 06/25/2013   Procedure: INTRAMEDULLARY (IM) RETROGRADE FEMORAL NAILING;  Surgeon: Marybelle Killings, MD;  Location: [facility];  Service: Orthopedics;  Laterality: Right;     Family History  Problem Relation Age of Onset  . Diabetes Father   . Colon cancer Father   . Colon cancer Mother      Social History   Social History  . Marital status: Married    Spouse name: N/A  . Number of children: N/A  . Years of education: N/A   Occupational History  . Not on file.   Social History Main Topics  . Smoking status: Former Smoker    Packs/day: 2.00    Years: 5.00    Types: Cigarettes  . Smokeless tobacco: Not on file  . Alcohol use No  . Drug use: No  . Sexual  activity: Not on file   Other Topics Concern  . Not on file   Social History Narrative  . No narrative on file     BP 122/84   Pulse 96   Ht 6' (1.829 m)   Wt 195 lb (88.5 kg)   SpO2 98%   BMI 26.45 kg/m   Physical Exam:  Well appearing 71 year old man, NAD HEENT: Unremarkable Neck:  6 cm JVD, no thyromegally Lungs:  Clear with no wheezes, rales, or rhonchi. HEART:  IRegular rate rhythm, no murmurs, no rubs, no clicks Abd:  soft, positive bowel sounds, no organomegally, no rebound, no guarding Ext:  2 plus pulses, no edema, no cyanosis, no clubbing Skin:  No rashes no nodules Neuro:  CN II through XII intact, motor grossly intact  ECG - atrial fibrillation with a controlled ventricular response  Assess/Plan:  1. Atrial fib - he continues to refuse systemic anti-coagulation. He will uptitrate his dose of metoprolol to 100 bid. 2. Coags - he refuses systemic anti-coagulation. 3. HTN - his blood pressure is reasonably well controlled. Will follow. 4. Dyspnea with exertion - I suspect that this is due to an increased rate in atrial fib. I have uptitrated the beta blocker. Will consider adding a diuretic if he is not improved.  [name].D.

## 2016-12-24 NOTE — Addendum Note (Signed)
Addended by: Bobby Rumpf C on: 12/24/2016 11:28 AM   Modules accepted: Orders

## 2017-04-13 DIAGNOSIS — Z85828 Personal history of other malignant neoplasm of skin: Secondary | ICD-10-CM | POA: Diagnosis not present

## 2017-04-13 DIAGNOSIS — L570 Actinic keratosis: Secondary | ICD-10-CM | POA: Diagnosis not present

## 2017-04-13 DIAGNOSIS — L723 Sebaceous cyst: Secondary | ICD-10-CM | POA: Diagnosis not present

## 2017-04-19 DIAGNOSIS — I482 Chronic atrial fibrillation: Secondary | ICD-10-CM | POA: Diagnosis not present

## 2017-04-19 DIAGNOSIS — E119 Type 2 diabetes mellitus without complications: Secondary | ICD-10-CM | POA: Diagnosis not present

## 2017-09-30 DIAGNOSIS — E119 Type 2 diabetes mellitus without complications: Secondary | ICD-10-CM | POA: Diagnosis not present

## 2017-10-19 DIAGNOSIS — R0989 Other specified symptoms and signs involving the circulatory and respiratory systems: Secondary | ICD-10-CM | POA: Diagnosis not present

## 2017-10-19 DIAGNOSIS — Z136 Encounter for screening for cardiovascular disorders: Secondary | ICD-10-CM | POA: Diagnosis not present

## 2017-10-19 DIAGNOSIS — E119 Type 2 diabetes mellitus without complications: Secondary | ICD-10-CM | POA: Diagnosis not present

## 2017-10-19 DIAGNOSIS — Z0000 Encounter for general adult medical examination without abnormal findings: Secondary | ICD-10-CM | POA: Diagnosis not present

## 2017-10-19 DIAGNOSIS — Z13220 Encounter for screening for lipoid disorders: Secondary | ICD-10-CM | POA: Diagnosis not present

## 2017-10-19 DIAGNOSIS — I482 Chronic atrial fibrillation: Secondary | ICD-10-CM | POA: Diagnosis not present

## 2017-10-19 DIAGNOSIS — R230 Cyanosis: Secondary | ICD-10-CM | POA: Diagnosis not present

## 2017-10-26 DIAGNOSIS — K449 Diaphragmatic hernia without obstruction or gangrene: Secondary | ICD-10-CM | POA: Diagnosis not present

## 2017-10-26 DIAGNOSIS — R230 Cyanosis: Secondary | ICD-10-CM | POA: Diagnosis not present

## 2017-10-26 DIAGNOSIS — N200 Calculus of kidney: Secondary | ICD-10-CM | POA: Diagnosis not present

## 2017-10-26 DIAGNOSIS — R933 Abnormal findings on diagnostic imaging of other parts of digestive tract: Secondary | ICD-10-CM | POA: Diagnosis not present

## 2017-10-26 DIAGNOSIS — R0989 Other specified symptoms and signs involving the circulatory and respiratory systems: Secondary | ICD-10-CM | POA: Diagnosis not present

## 2017-10-26 DIAGNOSIS — I482 Chronic atrial fibrillation: Secondary | ICD-10-CM | POA: Diagnosis not present

## 2017-10-26 DIAGNOSIS — I4891 Unspecified atrial fibrillation: Secondary | ICD-10-CM | POA: Diagnosis not present

## 2017-11-04 ENCOUNTER — Other Ambulatory Visit: Payer: Self-pay

## 2017-11-04 MED ORDER — ALLOPURINOL 300 MG PO TABS: 300.0000 mg | ORAL_TABLET | Freq: Every day | ORAL | 0 refills | Status: DC

## 2017-11-07 ENCOUNTER — Other Ambulatory Visit: Payer: Self-pay | Admitting: Internal Medicine

## 2017-11-07 DIAGNOSIS — R197 Diarrhea, unspecified: Secondary | ICD-10-CM | POA: Insufficient documentation

## 2017-11-09 ENCOUNTER — Encounter (HOSPITAL_COMMUNITY): Payer: Self-pay | Admitting: Emergency Medicine

## 2017-11-09 ENCOUNTER — Emergency Department (HOSPITAL_COMMUNITY): Payer: Medicare Other

## 2017-11-09 ENCOUNTER — Other Ambulatory Visit: Payer: Self-pay

## 2017-11-09 ENCOUNTER — Inpatient Hospital Stay (HOSPITAL_COMMUNITY)
Admission: EM | Admit: 2017-11-09 | Discharge: 2017-11-11 | DRG: 372 | Disposition: A | Discharge status: Home / Self Care | Payer: Medicare Other | Attending: Internal Medicine | Admitting: Internal Medicine

## 2017-11-09 DIAGNOSIS — M109 Gout, unspecified: Secondary | ICD-10-CM | POA: Diagnosis present

## 2017-11-09 DIAGNOSIS — R197 Diarrhea, unspecified: Secondary | ICD-10-CM

## 2017-11-09 DIAGNOSIS — K219 Gastro-esophageal reflux disease without esophagitis: Secondary | ICD-10-CM | POA: Diagnosis not present

## 2017-11-09 DIAGNOSIS — K5100 Ulcerative (chronic) pancolitis without complications: Secondary | ICD-10-CM

## 2017-11-09 DIAGNOSIS — N179 Acute kidney failure, unspecified: Secondary | ICD-10-CM | POA: Diagnosis not present

## 2017-11-09 DIAGNOSIS — Z79899 Other long term (current) drug therapy: Secondary | ICD-10-CM

## 2017-11-09 DIAGNOSIS — E119 Type 2 diabetes mellitus without complications: Secondary | ICD-10-CM | POA: Diagnosis present

## 2017-11-09 DIAGNOSIS — I4892 Unspecified atrial flutter: Secondary | ICD-10-CM | POA: Diagnosis not present

## 2017-11-09 DIAGNOSIS — A0472 Enterocolitis due to Clostridium difficile, not specified as recurrent: Principal | ICD-10-CM | POA: Diagnosis present

## 2017-11-09 DIAGNOSIS — Z7982 Long term (current) use of aspirin: Secondary | ICD-10-CM

## 2017-11-09 DIAGNOSIS — Z887 Allergy status to serum and vaccine status: Secondary | ICD-10-CM

## 2017-11-09 HISTORY — DX: Guillain-Barre syndrome: G61.0

## 2017-11-09 HISTORY — DX: Cyanosis: R23.0

## 2017-11-09 LAB — LIPASE, BLOOD: Lipase: 28 U/L (ref 11–51)

## 2017-11-09 LAB — URINALYSIS, ROUTINE W REFLEX MICROSCOPIC
Bacteria, UA: NONE SEEN
Bilirubin Urine: NEGATIVE
Glucose, UA: NEGATIVE mg/dL
KETONES UR: NEGATIVE mg/dL
LEUKOCYTES UA: NEGATIVE
Nitrite: NEGATIVE
PH: 5 (ref 5.0–8.0)
Protein, ur: 30 mg/dL — AB
Specific Gravity, Urine: 1.021 (ref 1.005–1.030)
Squamous Epithelial / LPF: NONE SEEN

## 2017-11-09 LAB — COMPREHENSIVE METABOLIC PANEL
ALK PHOS: 90 U/L (ref 38–126)
ALT: 20 U/L (ref 17–63)
AST: 23 U/L (ref 15–41)
Albumin: 3 g/dL — ABNORMAL LOW (ref 3.5–5.0)
Anion gap: 14 (ref 5–15)
BUN: 26 mg/dL — AB (ref 6–20)
CALCIUM: 8.8 mg/dL — AB (ref 8.9–10.3)
CO2: 22 mmol/L (ref 22–32)
CREATININE: 1.32 mg/dL — AB (ref 0.61–1.24)
Chloride: 102 mmol/L (ref 101–111)
GFR calc Af Amer: 60 mL/min — ABNORMAL LOW (ref >60)
GFR calc non Af Amer: 51 mL/min — ABNORMAL LOW (ref >60)
Glucose, Bld: 177 mg/dL — ABNORMAL HIGH (ref 65–99)
Potassium: 4.3 mmol/L (ref 3.5–5.1)
Sodium: 138 mmol/L (ref 135–145)
Total Bilirubin: 1.3 mg/dL — ABNORMAL HIGH (ref 0.3–1.2)
Total Protein: 6.6 g/dL (ref 6.5–8.1)

## 2017-11-09 LAB — DIFFERENTIAL
Basophils Absolute: 0 10*3/uL (ref 0.0–0.1)
Basophils Relative: 0 %
Eosinophils Absolute: 0 10*3/uL (ref 0.0–0.7)
Eosinophils Relative: 0 %
LYMPHS PCT: 6 %
Lymphs Abs: 1.6 10*3/uL (ref 0.7–4.0)
MONO ABS: 2.4 10*3/uL — AB (ref 0.1–1.0)
MONOS PCT: 9 %
NEUTROS ABS: 22 10*3/uL — AB (ref 1.7–7.7)
Neutrophils Relative %: 85 %

## 2017-11-09 LAB — CBC
HCT: 51.6 % (ref 39.0–52.0)
Hemoglobin: 17.4 g/dL — ABNORMAL HIGH (ref 13.0–17.0)
MCH: 31.6 pg (ref 26.0–34.0)
MCHC: 33.7 g/dL (ref 30.0–36.0)
MCV: 93.8 fL (ref 78.0–100.0)
PLATELETS: 250 10*3/uL (ref 150–400)
RBC: 5.5 MIL/uL (ref 4.22–5.81)
RDW: 14 % (ref 11.5–15.5)
WBC: 24.3 10*3/uL — AB (ref 4.0–10.5)

## 2017-11-09 LAB — LACTIC ACID, PLASMA
Lactic Acid, Venous: 2.9 mmol/L (ref 0.5–1.9)
Lactic Acid, Venous: 2.4 mmol/L (ref 0.5–1.9)

## 2017-11-09 MED ORDER — SODIUM CHLORIDE 0.9 % IV SOLN: INTRAVENOUS | Status: DC

## 2017-11-09 MED ORDER — ASPIRIN EC 81 MG PO TBEC
81.0000 mg | DELAYED_RELEASE_TABLET | Freq: Every day | ORAL | Status: DC
  Administered 2017-11-10 – 2017-11-11 (×2): 81 mg via ORAL
  Filled 2017-11-09 (×2): qty 1

## 2017-11-09 MED ORDER — METRONIDAZOLE IN NACL 5-0.79 MG/ML-% IV SOLN
500.0000 mg | Freq: Three times a day (TID) | INTRAVENOUS | Status: DC
  Administered 2017-11-10: 500 mg via INTRAVENOUS
  Filled 2017-11-09: qty 100

## 2017-11-09 MED ORDER — ACETAMINOPHEN 325 MG PO TABS: 650.0000 mg | ORAL_TABLET | Freq: Four times a day (QID) | ORAL | Status: DC | PRN

## 2017-11-09 MED ORDER — ONDANSETRON HCL 4 MG PO TABS: 4.0000 mg | ORAL_TABLET | Freq: Four times a day (QID) | ORAL | Status: DC | PRN

## 2017-11-09 MED ORDER — ENOXAPARIN SODIUM 40 MG/0.4ML ~~LOC~~ SOLN
40.0000 mg | SUBCUTANEOUS | Status: DC
  Administered 2017-11-10 – 2017-11-11 (×2): 40 mg via SUBCUTANEOUS
  Filled 2017-11-09 (×2): qty 0.4

## 2017-11-09 MED ORDER — CIPROFLOXACIN IN D5W 200 MG/100ML IV SOLN
200.0000 mg | Freq: Two times a day (BID) | INTRAVENOUS | Status: DC
  Filled 2017-11-09 (×3): qty 100

## 2017-11-09 MED ORDER — LACTATED RINGERS IV SOLN
INTRAVENOUS | Status: DC
  Administered 2017-11-09 – 2017-11-11 (×3): via INTRAVENOUS

## 2017-11-09 MED ORDER — METRONIDAZOLE IN NACL 5-0.79 MG/ML-% IV SOLN
500.0000 mg | Freq: Once | INTRAVENOUS | Status: AC
  Administered 2017-11-09: 500 mg via INTRAVENOUS

## 2017-11-09 MED ORDER — CIPROFLOXACIN IN D5W 400 MG/200ML IV SOLN
400.0000 mg | Freq: Once | INTRAVENOUS | Status: AC
  Administered 2017-11-09: 400 mg via INTRAVENOUS
  Filled 2017-11-09: qty 200

## 2017-11-09 MED ORDER — INSULIN ASPART 100 UNIT/ML ~~LOC~~ SOLN: 0.0000 [IU] | Freq: Three times a day (TID) | SUBCUTANEOUS | Status: DC

## 2017-11-09 MED ORDER — ACETAMINOPHEN 650 MG RE SUPP: 650.0000 mg | Freq: Four times a day (QID) | RECTAL | Status: DC | PRN

## 2017-11-09 MED ORDER — ONDANSETRON HCL 4 MG/2ML IJ SOLN: 4.0000 mg | Freq: Four times a day (QID) | INTRAMUSCULAR | Status: DC | PRN

## 2017-11-09 MED ORDER — ONDANSETRON HCL 4 MG/2ML IJ SOLN: 4.0000 mg | INTRAMUSCULAR | Status: DC | PRN

## 2017-11-09 MED ORDER — METOPROLOL TARTRATE 50 MG PO TABS
100.0000 mg | ORAL_TABLET | Freq: Two times a day (BID) | ORAL | Status: DC
  Administered 2017-11-09 – 2017-11-11 (×4): 100 mg via ORAL
  Filled 2017-11-09 (×4): qty 2

## 2017-11-09 MED ORDER — METRONIDAZOLE IN NACL 5-0.79 MG/ML-% IV SOLN
500.0000 mg | Freq: Once | INTRAVENOUS | Status: DC
  Filled 2017-11-09: qty 100

## 2017-11-09 MED ORDER — SODIUM CHLORIDE 0.9 % IV BOLUS
1000.0000 mL | Freq: Once | INTRAVENOUS | Status: AC
Start: 2017-11-09 — End: 2017-11-09
  Administered 2017-11-09: 1000 mL via INTRAVENOUS

## 2017-11-09 MED ORDER — ALLOPURINOL 300 MG PO TABS
300.0000 mg | ORAL_TABLET | Freq: Every day | ORAL | Status: DC
  Administered 2017-11-10 – 2017-11-11 (×2): 300 mg via ORAL
  Filled 2017-11-09 (×2): qty 1

## 2017-11-09 NOTE — H&P (Signed)
History and Physical    Marcus [name] DTO:[id number] DOB: 1946-03-24 DOA: 11/09/2017  PCP: Deloria Lair., MD   Patient coming from: home    Chief Complaint: diarrhea  HPI: Marcus [name] is a 72 y.o. male with medical history significant of atrial flutter status post ablation not on anticoagulation, diet-controlled diabetes, gout, history of Guillain-Barr syndrome coming in with diarrhea for 4 weeks and found to have mild AK I and pancolitis.  Patient reports that approximately 3-4 weeks ago he began to have approximately 6 bowel movements a day.  He denied any blood in the bowel movements.  He also developed vague crampy suprapubic abdominal pain.  Pain is only intermittent and is not exacerbated by eating.  It is not relieved by having a bowel movement.  He reports that he did not immediately use the restroom after eating.  He did not soil himself at night.  He denies any nausea or vomiting during this time.  He has noted his intake is decreased dramatically and so he has lost about 6 pounds.  His last colonoscopy was approximately 2 years ago that showed only some polyps.  He denies any recent travel inside or outside the [place].  He denies any recent consumption of unpasteurized cheeses.  He denies any exposure to farm animals.  He apparently saw his primary care doctor for this however has not received an answer yet.  ED Course: In the ED vitals were unremarkable.  White count was noted to be 24.3.  Creatinine was 1.32 and BUN was 26 which is above his baseline.  Patient was noted to have elevated lactate of 2.2 and subsequently 2.4.  Patient's glucose was 177.  CT of the abdomen pelvis showed pancolitis.  Patient received IV ciprofloxacin, metronidazole and IV fluids.  Review of Systems: As per HPI otherwise 10 point review of systems negative.    Past Medical History:  Diagnosis Date  . AF (atrial fibrillation) ([place])   . Atrial flutter ([place])   . Blue toes    chronic  . GERD (gastroesophageal reflux disease)   . Gout   . Guillain Barr syndrome [facility])     Past Surgical History:  Procedure Laterality Date  . catheter ablation  12/23/2004  . COLONOSCOPY N/A 09/18/2014   Procedure: COLONOSCOPY;  Surgeon: Rogene Houston, MD;  [facility];  Service: Endoscopy;  Laterality: N/A;  830  . FEMUR IM NAIL Right 06/25/2013   Procedure: INTRAMEDULLARY (IM) RETROGRADE FEMORAL NAILING;  Surgeon: Marybelle Killings, MD;  Location: [place];  Service: Orthopedics;  Laterality: Right;     reports that he quit smoking about a year ago. His smoking use included cigarettes. He started smoking about a year ago. He has a 10.00 pack-year smoking history. He has never used smokeless tobacco. He reports that he does not drink alcohol or use drugs.  Allergies  Allergen Reactions  . Influenza Vaccines Other (See Comments)    Guillain-Barre syndrome    Family History  Problem Relation Age of Onset  . Diabetes Father   . Colon cancer Father   . Colon cancer Mother     Prior to Admission medications   Medication Sig Start Date End Date Taking? Authorizing Provider  aspirin EC 81 MG tablet Take 1 tablet (81 mg total) by mouth daily. 12/22/16  Yes Evans Lance, MD  metoprolol tartrate (LOPRESSOR) 100 MG tablet Take 1 tablet (100 mg total) by mouth 2 (two) times daily. 12/22/16  Yes Evans Lance, MD  allopurinol (ZYLOPRIM) 300 MG tablet Take 1 tablet (300 mg total) by mouth daily. Patient not taking: Reported on 11/09/2017 11/04/17   Evans Lance, MD    Physical Exam: Vitals:   11/09/17 1317 11/09/17 1800 11/09/17 1830 11/09/17 2056  BP:  126/88 126/88 126/87  Pulse:   76 96  Resp:   18 16  Temp:      TempSrc:      SpO2:   98% 94%  Weight: 86.6 kg (191 lb)     Height: 6' (1.829 m)       Constitutional: NAD, calm, comfortable Vitals:   11/09/17 1317 11/09/17 1800 11/09/17 1830 11/09/17 2056  BP:  126/88 126/88 126/87  Pulse:   76 96  Resp:   18  16  Temp:      TempSrc:      SpO2:   98% 94%  Weight: 86.6 kg (191 lb)     Height: 6' (1.829 m)      Eyes: Anicteric sclera ENMT: Dry mucous membranes Neck: normal, supple Respiratory: clear to auscultation bilaterally, no wheezing, no crackles. Normal respiratory effort. No accessory muscle use.  Cardiovascular: Regular rate and rhythm, no murmurs Abdomen: Soft,  no organomegaly, no rebound or guarding, mild suprapubic tenderness to deep palpation only Musculoskeletal: No lower extremity edema Skin: no rashes on visible skin Neurologic: Grossly intact, moving all extremities.  Psychiatric: Normal judgment and insight. Alert and oriented x 3. Normal mood.     Labs on Admission: I have personally reviewed following labs and imaging studies  CBC: Recent Labs  Lab 11/09/17 1411 11/09/17 1803  WBC 24.3*  --   NEUTROABS  --  22.0*  HGB 17.4*  --   HCT 51.6  --   MCV 93.8  --   PLT 250  --    Basic Metabolic Panel: Recent Labs  Lab 11/09/17 1411  NA 138  K 4.3  CL 102  CO2 22  GLUCOSE 177*  BUN 26*  CREATININE 1.32*  CALCIUM 8.8*   GFR: Estimated Creatinine Clearance: 53.9 mL/min (A) (by C-G formula based on SCr of 1.32 mg/dL (H)). Liver Function Tests: Recent Labs  Lab 11/09/17 1411  AST 23  ALT 20  ALKPHOS 90  BILITOT 1.3*  PROT 6.6  ALBUMIN 3.0*   Recent Labs  Lab 11/09/17 1411  LIPASE 28   No results for input(s): AMMONIA in the last 168 hours. Coagulation Profile: No results for input(s): INR, PROTIME in the last 168 hours. Cardiac Enzymes: No results for input(s): CKTOTAL, CKMB, CKMBINDEX, TROPONINI in the last 168 hours. BNP (last 3 results) No results for input(s): PROBNP in the last 8760 hours. HbA1C: No results for input(s): HGBA1C in the last 72 hours. CBG: No results for input(s): GLUCAP in the last 168 hours. Lipid Profile: No results for input(s): CHOL, HDL, LDLCALC, TRIG, CHOLHDL, LDLDIRECT in the last 72 hours. Thyroid Function  Tests: No results for input(s): TSH, T4TOTAL, FREET4, T3FREE, THYROIDAB in the last 72 hours. Anemia Panel: No results for input(s): VITAMINB12, FOLATE, FERRITIN, TIBC, IRON, RETICCTPCT in the last 72 hours. Urine analysis:    Component Value Date/Time   COLORURINE YELLOW 07/21/2013 2130   APPEARANCEUR CLEAR 07/21/2013 2130   LABSPEC 1.026 07/21/2013 2130   PHURINE 5.0 07/21/2013 2130   GLUCOSEU NEGATIVE 07/21/2013 2130   HGBUR NEGATIVE 07/21/2013 2130   BILIRUBINUR NEGATIVE 07/21/2013 2130   [place] NEGATIVE 07/21/2013 2130   PROTEINUR NEGATIVE 07/21/2013 2130  UROBILINOGEN 1.0 07/21/2013 2130   NITRITE NEGATIVE 07/21/2013 2130   LEUKOCYTESUR NEGATIVE 07/21/2013 2130    Radiological Exams on Admission: Ct Abdomen Pelvis Wo Contrast  Result Date: 11/09/2017 CLINICAL DATA:  72 y/o M; lower abdominal pain with diarrhea for 4 weeks. EXAM: CT ABDOMEN AND PELVIS WITHOUT CONTRAST TECHNIQUE: Multidetector CT imaging of the abdomen and pelvis was performed following the standard protocol without IV contrast. COMPARISON:  03/10/2009 CT abdomen and pelvis FINDINGS: Lower chest: No acute abnormality. Hepatobiliary: No focal liver lesion. Punctate gallstone. No biliary dilatation. No gallbladder wall thickening. Pancreas: Unremarkable. No pancreatic ductal dilatation or surrounding inflammatory changes. Spleen: Normal in size without focal abnormality. Adrenals/Urinary Tract: Small nonobstructing kidney stones are present in interpolar and lower pole of left kidney measuring up to 3 mm. Normal adrenal glands. No focal kidney lesion. No hydronephrosis. Normal bladder. Stomach/Bowel: No obstructive or inflammatory changes of the stomach and small bowel. Diffuse extensive wall thickening of the colon and rectum with pericolonic fat stranding. No evidence for perforation or abscess. Normal appendix. Vascular/Lymphatic: Aortic atherosclerosis. No enlarged abdominal or pelvic lymph nodes. Reproductive:  Prostate is unremarkable. Other: Trace ascites. Musculoskeletal: No fracture is seen. Partially visualized right hip intramedullary nail with heterotopic calcification superior to the greater trochanter. IMPRESSION: 1. Acute pancolitis. No evidence for perforation or abscess. Trace ascites, likely reactive inflammation. 2. Cholelithiasis and left kidney nonobstructive nephrolithiasis. 3. Aortic atherosclerosis. Electronically Signed   By: Kristine Garbe M.D.   On: 11/09/2017 20:49    EKG: Independently reviewed.  Not done is not indicated  Assessment/Plan Active Problems:   GOUT   Atrial flutter (HCC)   Pancolitis (HCC)   #) Pancolitis with subacute diarrhea: At this time the differential still includes infectious causes.  He describes predominantly secretory diarrhea as he has had to get up at night to go to the bathroom while eating does not cause of the diarrhea to get worse which would be more consistent with osmotic cause.  While C. difficile is possible it does not have any significant risk factors for C. difficile including no recent antibiotic use, no PPI use and no healthcare contact.  Unfortunately he has no other risk factors for other types of diarrhea either.  Interestingly enough he apparently had Guillain-Barr syndrome in the past.  He is very elevated white count would argue for an infectious cause however.  We will treat for both C. difficile and colitis with IV ciprofloxacin and metronidazole -Stool culture, ova and parasites, C. difficile PCR pending -IV ciprofloxacin and metronidazole started 11/09/2017 - IV fluids  #) AK I: Patient has mildly elevated creatinine from baseline. -IV fluids -Hold nephrotoxins  #) Atrial flutter/fibrillation: Patient apparently had an ablation for this. -Continue metoprolol tartrate 100 mg twice daily -Continue aspirin 81 mg daily  #) Diet-controlled diabetes: Patient apparently is not on any medications for this -Restricted  diet -Sliding scale insulin, before meals at bedtime  #) Gout: -Continue allopurinol 300 mg daily  Fluids: IV lactated Ringer's Electrolytes: Monitor and supplement Nutrition: Carb restricted diet  Prophylaxis: Enoxaparin  Disposition: Pending improvement of diarrhea and white blood cell count  Full code   Cristy Folks MD [facility]   If 7PM-7AM, please contact night-coverage [email or web address] Password [facility]  11/09/2017, 9:29 PM

## 2017-11-09 NOTE — ED Provider Notes (Signed)
[facility] EMERGENCY DEPARTMENT Provider Note   CSN: [id number] Arrival date & time: 11/09/17  1309     History   Chief Complaint Chief Complaint  Patient presents with  . Diarrhea    HPI Prithvi Kooi is a 72 y.o. male.  HPI  Pt was seen at 1750. Per pt, c/o gradual onset and persistence of multiple intermittent episodes of diarrhea for the past 4 weeks.  Describes the stools as "sometimes black" and "sometimes yellow." Has been associated with lower abd "pain." Denies N/V, no CP/SOB, no back pain, no fevers, no blood in stools, no dysuria/hematuria.    Past Medical History:  Diagnosis Date  . AF (atrial fibrillation) ([place])   . Atrial flutter ([place])   . Blue toes    chronic  . GERD (gastroesophageal reflux disease)   . Gout   . Guillain Barr syndrome [facility])     Patient Active Problem List   Diagnosis Date Noted  . Focal neurological deficit 07/21/2013  . GBS (Guillain Barre syndrome) ([place]) 07/21/2013  . Leg weakness 07/21/2013  . Femur fracture ([place]) 06/25/2013  . Femur fracture, right ([place]) 06/25/2013  . GOUT 07/31/2009  . Atrial fibrillation with RVR ([place]) 07/31/2009  . ATRIAL FLUTTER 07/31/2009    Past Surgical History:  Procedure Laterality Date  . catheter ablation  12/23/2004  . COLONOSCOPY N/A 09/18/2014   Procedure: COLONOSCOPY;  Surgeon: Rogene Houston, MD;  [facility];  Service: Endoscopy;  Laterality: N/A;  830  . FEMUR IM NAIL Right 06/25/2013   Procedure: INTRAMEDULLARY (IM) RETROGRADE FEMORAL NAILING;  Surgeon: Marybelle Killings, MD;  Location: [place];  Service: Orthopedics;  Laterality: Right;        Home Medications    Prior to Admission medications   Medication Sig Start Date End Date Taking? Authorizing Provider  allopurinol (ZYLOPRIM) 300 MG tablet Take 1 tablet (300 mg total) by mouth daily. 11/04/17   Evans Lance, MD  aspirin EC 81 MG tablet Take 1 tablet (81 mg total) by mouth daily. 12/22/16   Evans Lance,  MD  metoprolol tartrate (LOPRESSOR) 100 MG tablet Take 1 tablet (100 mg total) by mouth 2 (two) times daily. 12/22/16   Evans Lance, MD    Family History Family History  Problem Relation Age of Onset  . Diabetes Father   . Colon cancer Father   . Colon cancer Mother     Social History Social History   Tobacco Use  . Smoking status: Former Smoker    Packs/day: 2.00    Years: 5.00    Pack years: 10.00    Types: Cigarettes    Start date: 11/23/2016    Last attempt to quit: 11/24/2016    Years since quitting: 0.9  . Smokeless tobacco: Never Used  Substance Use Topics  . Alcohol use: No  . Drug use: No     Allergies   Patient has no known allergies.   Review of Systems Review of Systems ROS: Statement: All systems negative except as marked or noted in the HPI; Constitutional: Negative for fever and chills. ; ; Eyes: Negative for eye pain, redness and discharge. ; ; ENMT: Negative for ear pain, hoarseness, nasal congestion, sinus pressure and sore throat. ; ; Cardiovascular: Negative for chest pain, palpitations, diaphoresis, dyspnea and peripheral edema. ; ; Respiratory: Negative for cough, wheezing and stridor. ; ; Gastrointestinal: +diarrhea, abd pain. Negative for nausea, vomiting, blood in stool, hematemesis, jaundice and rectal  bleeding. . ; ; Genital:  No penile drainage or rash, no testicular pain or swelling, no scrotal rash or swelling. ;; Genitourinary: Negative for dysuria, flank pain and hematuria. ; ; Musculoskeletal: Negative for back pain and neck pain. Negative for swelling and trauma.; ; Skin: Negative for pruritus, rash, abrasions, blisters, bruising and skin lesion.; ; Neuro: Negative for headache, lightheadedness and neck stiffness. Negative for weakness, altered level of consciousness, altered mental status, extremity weakness, paresthesias, involuntary movement, seizure and syncope.       Physical Exam Updated Vital Signs BP 113/90 (BP Location: Right Arm)    Pulse 96   Temp 98.4 F (36.9 C) (Oral)   Resp 18   Ht 6' (1.829 m)   Wt 86.6 kg (191 lb)   SpO2 93%   BMI 25.90 kg/m     18:30 Orthostatic Vital Signs HC  Orthostatic Lying   BP- Lying: 126/88   Pulse- Lying: 78       Orthostatic Sitting  BP- Sitting: 127/85   Pulse- Sitting: 81       Orthostatic Standing at 0 minutes  BP- Standing at 0 minutes: 109/83   Pulse- Standing at 0 minutes: 89     Physical Exam 1755: Physical examination:  Nursing notes reviewed; Vital signs and O2 SAT reviewed;  Constitutional: Well developed, Well nourished, Well hydrated, In no acute distress; Head:  Normocephalic, atraumatic; Eyes: EOMI, PERRL, No scleral icterus; ENMT: Mouth and pharynx normal, Mucous membranes moist; Neck: Supple, Full range of motion, No lymphadenopathy; Cardiovascular: Regular rate and rhythm, No gallop; Respiratory: Breath sounds clear & equal bilaterally, No wheezes.  Speaking full sentences with ease, Normal respiratory effort/excursion; Chest: Nontender, Movement normal; Abdomen: Soft, +mild diffuse tenderness to palp. No rebound or guarding. Nondistended, Normal bowel sounds; Genitourinary: No CVA tenderness; Extremities: Peripheral pulses normal, No tenderness, No edema, No calf edema or asymmetry.; Neuro: AA&Ox3, tangential historian. Major CN grossly intact.  Speech clear. No gross focal motor or sensory deficits in extremities.; Skin: Color normal, Warm, Dry.   ED Treatments / Results  Labs (all labs ordered are listed, but only abnormal results are displayed)   EKG None  Radiology   Procedures Procedures (including critical care time)  Medications Ordered in ED Medications  0.9 %  sodium chloride infusion (has no administration in time range)     Initial Impression / Assessment and Plan / ED Course  I have reviewed the triage vital signs and the nursing notes.  Pertinent labs & imaging results that were available during my care of the patient were  reviewed by me and considered in my medical decision making (see chart for details).  MDM Reviewed: previous chart, nursing note and vitals Reviewed previous: labs Interpretation: labs and CT scan   Results for orders placed or performed during the hospital encounter of 11/09/17  Lipase, blood  Result Value Ref Range   Lipase 28 11 - 51 U/L  Comprehensive metabolic panel  Result Value Ref Range   Sodium 138 135 - 145 mmol/L   Potassium 4.3 3.5 - 5.1 mmol/L   Chloride 102 101 - 111 mmol/L   CO2 22 22 - 32 mmol/L   Glucose, Bld 177 (H) 65 - 99 mg/dL   BUN 26 (H) 6 - 20 mg/dL   Creatinine, Ser 1.32 (H) 0.61 - 1.24 mg/dL   Calcium 8.8 (L) 8.9 - 10.3 mg/dL   Total Protein 6.6 6.5 - 8.1 g/dL   Albumin 3.0 (L) 3.5 - 5.0 g/dL  AST 23 15 - 41 U/L   ALT 20 17 - 63 U/L   Alkaline Phosphatase 90 38 - 126 U/L   Total Bilirubin 1.3 (H) 0.3 - 1.2 mg/dL   GFR calc non Af Amer 51 (L) >60 mL/min   GFR calc Af Amer 60 (L) >60 mL/min   Anion gap 14 5 - 15  CBC  Result Value Ref Range   WBC 24.3 (H) 4.0 - 10.5 K/uL   RBC 5.50 4.22 - 5.81 MIL/uL   Hemoglobin 17.4 (H) 13.0 - 17.0 g/dL   HCT 51.6 39.0 - 52.0 %   MCV 93.8 78.0 - 100.0 fL   MCH 31.6 26.0 - 34.0 pg   MCHC 33.7 30.0 - 36.0 g/dL   RDW 14.0 11.5 - 15.5 %   Platelets 250 150 - 400 K/uL  Differential  Result Value Ref Range   Neutrophils Relative % 85 %   Neutro Abs 22.0 (H) 1.7 - 7.7 K/uL   Lymphocytes Relative 6 %   Lymphs Abs 1.6 0.7 - 4.0 K/uL   Monocytes Relative 9 %   Monocytes Absolute 2.4 (H) 0.1 - 1.0 K/uL   Eosinophils Relative 0 %   Eosinophils Absolute 0.0 0.0 - 0.7 K/uL   Basophils Relative 0 %   Basophils Absolute 0.0 0.0 - 0.1 K/uL  Lactic acid, plasma  Result Value Ref Range   Lactic Acid, Venous 2.9 (HH) 0.5 - 1.9 mmol/L  Lactic acid, plasma  Result Value Ref Range   Lactic Acid, Venous 2.4 (HH) 0.5 - 1.9 mmol/L   Ct Abdomen Pelvis Wo Contrast Result Date: 11/09/2017 CLINICAL DATA:  72 y/o M; lower  abdominal pain with diarrhea for 4 weeks. EXAM: CT ABDOMEN AND PELVIS WITHOUT CONTRAST TECHNIQUE: Multidetector CT imaging of the abdomen and pelvis was performed following the standard protocol without IV contrast. COMPARISON:  03/10/2009 CT abdomen and pelvis FINDINGS: Lower chest: No acute abnormality. Hepatobiliary: No focal liver lesion. Punctate gallstone. No biliary dilatation. No gallbladder wall thickening. Pancreas: Unremarkable. No pancreatic ductal dilatation or surrounding inflammatory changes. Spleen: Normal in size without focal abnormality. Adrenals/Urinary Tract: Small nonobstructing kidney stones are present in interpolar and lower pole of left kidney measuring up to 3 mm. Normal adrenal glands. No focal kidney lesion. No hydronephrosis. Normal bladder. Stomach/Bowel: No obstructive or inflammatory changes of the stomach and small bowel. Diffuse extensive wall thickening of the colon and rectum with pericolonic fat stranding. No evidence for perforation or abscess. Normal appendix. Vascular/Lymphatic: Aortic atherosclerosis. No enlarged abdominal or pelvic lymph nodes. Reproductive: Prostate is unremarkable. Other: Trace ascites. Musculoskeletal: No fracture is seen. Partially visualized right hip intramedullary nail with heterotopic calcification superior to the greater trochanter. IMPRESSION: 1. Acute pancolitis. No evidence for perforation or abscess. Trace ascites, likely reactive inflammation. 2. Cholelithiasis and left kidney nonobstructive nephrolithiasis. 3. Aortic atherosclerosis. Electronically Signed   By: Kristine Garbe M.D.   On: 11/09/2017 20:49    2110:  Pt states he was just able to give a stool sample; will send to lab for cdiff and GI pathogen panel. Will start IV cipro/flagyl for pancolitis. IVF given for mildly elevated lactic acid with slow improvement. Dx and testing d/w pt and family.  Questions answered.  Verb understanding, agreeable to admit. T/C returned from  [facility]ors, case discussed, including:  HPI, pertinent PM/SHx, VS/PE, dx testing, ED course and treatment:  Agreeable to admit.     Final Clinical Impressions(s) / ED Diagnoses   Final diagnoses:  None    ED Discharge Orders    None       Francine Graven, DO 11/12/17 1645

## 2017-11-09 NOTE — ED Notes (Signed)
Patient is in the restroom, will take to floor when finished.

## 2017-11-09 NOTE — ED Notes (Addendum)
Pt aware of need for urine sample and need for stool sample. Pt states he cannot provide one at this time but inform nursing staff once he is able to do so

## 2017-11-09 NOTE — ED Triage Notes (Signed)
Pt c/o of lower abdominal pain with diarrhea x 4 weeks. Denies any new medications.

## 2017-11-09 NOTE — ED Notes (Signed)
Patient is aware that we will need an additional stool sample if able to go.

## 2017-11-09 NOTE — ED Notes (Signed)
ED TO INPATIENT HANDOFF REPORT  Name/Age/Gender Marcus [name] 72 y.o. male  Code Status Code Status History    Date Active Date Inactive Code Status Order ID Comments User Context   07/21/2013 2100 07/26/2013 1911 Full Code [id number]  Kinnie Feil, MD Inpatient   06/25/2013 2143 06/30/2013 1330 Full Code [id number]  Marybelle Killings, MD Inpatient   06/25/2013 1612 06/25/2013 2143 Full Code [id number]  Epimenio Foot, PA-C Inpatient      Home/SNF/Other Home  Chief Complaint DIARRHEA, WEAKNESS  Level of Care/Admitting Diagnosis ED Disposition    ED Disposition Condition [place]: [facility] [093235]  Level of Care: Med-Surg [16]  Diagnosis: Pancolitis [facility]) [573220]  Admitting Physician: Cristy Folks [[id number]]  Attending Physician: Cristy Folks [[id number]]  PT Class (Do Not Modify): Observation [104]  PT Acc Code (Do Not Modify): Observation [10022]       Medical History Past Medical History:  Diagnosis Date  . AF (atrial fibrillation) ([place])   . Atrial flutter ([place])   . Blue toes    chronic  . GERD (gastroesophageal reflux disease)   . Gout   . Guillain Barr syndrome ([place])     Allergies Allergies  Allergen Reactions  . Influenza Vaccines Other (See Comments)    Guillain-Barre syndrome    IV Location/Drains/Wounds Patient Lines/Drains/Airways Status   Active Line/Drains/Airways    Name:   Placement date:   Placement time:   Site:   Days:   Peripheral IV 11/09/17 Right Antecubital   11/09/17    1815    Antecubital   less than 1   Incision 06/25/13 Hip Right   06/25/13    2006     1598   Incision 06/25/13 Thigh Right   06/25/13    2006     1598   Incision 06/25/13 Knee Right   06/25/13    2034     1598          Labs/Imaging Results for orders placed or performed during the hospital encounter of 11/09/17 (from the past 48 hour(s))  Urinalysis, Routine w reflex microscopic     Status: Abnormal   Collection  Time: 11/09/17  1:24 PM  Result Value Ref Range   Color, Urine AMBER (A) YELLOW    Comment: BIOCHEMICALS MAY BE AFFECTED BY COLOR   APPearance CLEAR CLEAR   Specific Gravity, Urine 1.021 1.005 - 1.030   pH 5.0 5.0 - 8.0   Glucose, UA NEGATIVE NEGATIVE mg/dL   Hgb urine dipstick SMALL (A) NEGATIVE   Bilirubin Urine NEGATIVE NEGATIVE   Ketones, ur NEGATIVE NEGATIVE mg/dL   Protein, ur 30 (A) NEGATIVE mg/dL   Nitrite NEGATIVE NEGATIVE   Leukocytes, UA NEGATIVE NEGATIVE   RBC / HPF 6-30 0 - 5 RBC/hpf   WBC, UA 0-5 0 - 5 WBC/hpf   Bacteria, UA NONE SEEN NONE SEEN   Squamous Epithelial / LPF NONE SEEN NONE SEEN   Mucus PRESENT     Comment: Performed at [facility], [address] [place], [place] [address]  Lipase, blood     Status: None   Collection Time: 11/09/17  2:11 PM  Result Value Ref Range   Lipase 28 11 - 51 U/L    Comment: Performed at [facility], [address], [place], [place] [address]  Comprehensive metabolic panel     Status: Abnormal   Collection Time: 11/09/17  2:11 PM  Result Value Ref Range  Sodium 138 135 - 145 mmol/L   Potassium 4.3 3.5 - 5.1 mmol/L   Chloride 102 101 - 111 mmol/L   CO2 22 22 - 32 mmol/L   Glucose, Bld 177 (H) 65 - 99 mg/dL   BUN 26 (H) 6 - 20 mg/dL   Creatinine, Ser 1.32 (H) 0.61 - 1.24 mg/dL   Calcium 8.8 (L) 8.9 - 10.3 mg/dL   Total Protein 6.6 6.5 - 8.1 g/dL   Albumin 3.0 (L) 3.5 - 5.0 g/dL   AST 23 15 - 41 U/L   ALT 20 17 - 63 U/L   Alkaline Phosphatase 90 38 - 126 U/L   Total Bilirubin 1.3 (H) 0.3 - 1.2 mg/dL   GFR calc non Af Amer 51 (L) >60 mL/min   GFR calc Af Amer 60 (L) >60 mL/min    Comment: (NOTE) The eGFR has been calculated using the CKD EPI equation. This calculation has not been validated in all clinical situations. eGFR's persistently <60 mL/min signify possible Chronic Kidney Disease.    Anion gap 14 5 - 15    Comment: Performed at [facility], [address] [place], [place] [address]  CBC     Status:  Abnormal   Collection Time: 11/09/17  2:11 PM  Result Value Ref Range   WBC 24.3 (H) 4.0 - 10.5 K/uL   RBC 5.50 4.22 - 5.81 MIL/uL   Hemoglobin 17.4 (H) 13.0 - 17.0 g/dL   HCT 51.6 39.0 - 52.0 %   MCV 93.8 78.0 - 100.0 fL   MCH 31.6 26.0 - 34.0 pg   MCHC 33.7 30.0 - 36.0 g/dL   RDW 14.0 11.5 - 15.5 %   Platelets 250 150 - 400 K/uL    Comment: Performed at [facility], [address] [place], [place] [address]  Differential     Status: Abnormal   Collection Time: 11/09/17  6:03 PM  Result Value Ref Range   Neutrophils Relative % 85 %   Neutro Abs 22.0 (H) 1.7 - 7.7 K/uL   Lymphocytes Relative 6 %   Lymphs Abs 1.6 0.7 - 4.0 K/uL   Monocytes Relative 9 %   Monocytes Absolute 2.4 (H) 0.1 - 1.0 K/uL   Eosinophils Relative 0 %   Eosinophils Absolute 0.0 0.0 - 0.7 K/uL   Basophils Relative 0 %   Basophils Absolute 0.0 0.0 - 0.1 K/uL    Comment: Performed at [facility], [address], [place], [place] [address]  Lactic acid, plasma     Status: Abnormal   Collection Time: 11/09/17  6:03 PM  Result Value Ref Range   Lactic Acid, Venous 2.9 (HH) 0.5 - 1.9 mmol/L    Comment: CRITICAL RESULT CALLED TO, READ BACK BY AND VERIFIED WITH: Aerionna Moravek,K @ 1938 ON 4.17.19 BY BOWMAN,L Performed at [facility], [address], [place], [place] [address]   Lactic acid, plasma     Status: Abnormal   Collection Time: 11/09/17  8:06 PM  Result Value Ref Range   Lactic Acid, Venous 2.4 (HH) 0.5 - 1.9 mmol/L    Comment: CRITICAL RESULT CALLED TO, READ BACK BY AND VERIFIED WITH: Brody Kump,K ON 11/09/17 AT 2100 BY LOY,C Performed at [facility], [address] [place], [place] [address]    Ct Abdomen Pelvis Wo Contrast  Result Date: 11/09/2017 CLINICAL DATA:  72 y/o M; lower abdominal pain with diarrhea for 4 weeks. EXAM: CT ABDOMEN AND PELVIS WITHOUT CONTRAST TECHNIQUE: Multidetector CT imaging of the abdomen and pelvis  was performed following the standard protocol without IV contrast. COMPARISON:   03/10/2009 CT abdomen and pelvis FINDINGS: Lower chest: No acute abnormality. Hepatobiliary: No focal liver lesion. Punctate gallstone. No biliary dilatation. No gallbladder wall thickening. Pancreas: Unremarkable. No pancreatic ductal dilatation or surrounding inflammatory changes. Spleen: Normal in size without focal abnormality. Adrenals/Urinary Tract: Small nonobstructing kidney stones are present in interpolar and lower pole of left kidney measuring up to 3 mm. Normal adrenal glands. No focal kidney lesion. No hydronephrosis. Normal bladder. Stomach/Bowel: No obstructive or inflammatory changes of the stomach and small bowel. Diffuse extensive wall thickening of the colon and rectum with pericolonic fat stranding. No evidence for perforation or abscess. Normal appendix. Vascular/Lymphatic: Aortic atherosclerosis. No enlarged abdominal or pelvic lymph nodes. Reproductive: Prostate is unremarkable. Other: Trace ascites. Musculoskeletal: No fracture is seen. Partially visualized right hip intramedullary nail with heterotopic calcification superior to the greater trochanter. IMPRESSION: 1. Acute pancolitis. No evidence for perforation or abscess. Trace ascites, likely reactive inflammation. 2. Cholelithiasis and left kidney nonobstructive nephrolithiasis. 3. Aortic atherosclerosis. Electronically Signed   By: Kristine Garbe M.D.   On: 11/09/2017 20:49    Pending Labs Unresulted Labs (From admission, onward)   Start     Ordered   11/09/17 2112  Stool culture (children & immunocomp patients)  Once,   R     11/09/17 2111   11/09/17 2112  OVA + PARASITE EXAM  Once,   R     11/09/17 2111   11/09/17 1757  Gastrointestinal Panel by PCR , Stool  (Gastrointestinal Panel by PCR, Stool)  Once,   R     11/09/17 1756   11/09/17 1757  C difficile quick scan w PCR reflex  (C Difficile quick screen w PCR reflex panel)  Once, for 24 hours,   R     11/09/17 1756   Signed and Held  CBC  Tomorrow morning,   R      Signed and Held   Signed and Held  Basic metabolic panel  Tomorrow morning,   R     Signed and Held   Signed and Held  Magnesium  Tomorrow morning,   R     Signed and Held      Vitals/Pain Today's Vitals   11/09/17 1800 11/09/17 1830 11/09/17 2056 11/09/17 2130  BP: 126/88 126/88 126/87 116/72  Pulse:  76 96 96  Resp:  _0 Temp:      TempSrc:      SpO2:  98% 94% 95%  Weight:      Height:      PainSc:        Isolation Precautions No active isolations  Medications Medications  0.9 %  sodium chloride infusion ( Intravenous Paused 11/09/17 2055)  ciprofloxacin (CIPRO) IVPB 400 mg (400 mg Intravenous New Bag/Given 11/09/17 2134)  metroNIDAZOLE (FLAGYL) IVPB 500 mg (has no administration in time range)  sodium chloride 0.9 % bolus 1,000 mL (0 mLs Intravenous Stopped 11/09/17 2142)    Mobility walks

## 2017-11-10 ENCOUNTER — Encounter (HOSPITAL_COMMUNITY): Payer: Self-pay | Admitting: *Deleted

## 2017-11-10 DIAGNOSIS — N179 Acute kidney failure, unspecified: Secondary | ICD-10-CM | POA: Diagnosis present

## 2017-11-10 DIAGNOSIS — M109 Gout, unspecified: Secondary | ICD-10-CM | POA: Diagnosis present

## 2017-11-10 DIAGNOSIS — Z887 Allergy status to serum and vaccine status: Secondary | ICD-10-CM | POA: Diagnosis not present

## 2017-11-10 DIAGNOSIS — Z79899 Other long term (current) drug therapy: Secondary | ICD-10-CM | POA: Diagnosis not present

## 2017-11-10 DIAGNOSIS — A0472 Enterocolitis due to Clostridium difficile, not specified as recurrent: Secondary | ICD-10-CM | POA: Diagnosis not present

## 2017-11-10 DIAGNOSIS — I4892 Unspecified atrial flutter: Secondary | ICD-10-CM | POA: Diagnosis present

## 2017-11-10 DIAGNOSIS — K219 Gastro-esophageal reflux disease without esophagitis: Secondary | ICD-10-CM | POA: Diagnosis present

## 2017-11-10 DIAGNOSIS — E119 Type 2 diabetes mellitus without complications: Secondary | ICD-10-CM | POA: Diagnosis present

## 2017-11-10 DIAGNOSIS — Z7982 Long term (current) use of aspirin: Secondary | ICD-10-CM | POA: Diagnosis not present

## 2017-11-10 LAB — GASTROINTESTINAL PANEL BY PCR, STOOL (REPLACES STOOL CULTURE)

## 2017-11-10 LAB — GLUCOSE, CAPILLARY
Glucose-Capillary: 140 mg/dL — ABNORMAL HIGH (ref 65–99)
Glucose-Capillary: 171 mg/dL — ABNORMAL HIGH (ref 65–99)
Glucose-Capillary: 153 mg/dL — ABNORMAL HIGH (ref 65–99)
Glucose-Capillary: 132 mg/dL — ABNORMAL HIGH (ref 65–99)

## 2017-11-10 LAB — BASIC METABOLIC PANEL
Anion gap: 13 (ref 5–15)
CO2: 21 mmol/L — ABNORMAL LOW (ref 22–32)
Calcium: 8.2 mg/dL — ABNORMAL LOW (ref 8.9–10.3)
Creatinine, Ser: 1.1 mg/dL (ref 0.61–1.24)
GFR calc Af Amer: >60 mL/min (ref >60)
GFR calc non Af Amer: >60 mL/min (ref >60)
Sodium: 137 mmol/L (ref 135–145)

## 2017-11-10 LAB — CBC
HCT: 47.2 % (ref 39.0–52.0)
Hemoglobin: 15.5 g/dL (ref 13.0–17.0)
MCH: 30.8 pg (ref 26.0–34.0)
MCHC: 32.8 g/dL (ref 30.0–36.0)
MCV: 93.8 fL (ref 78.0–100.0)
Platelets: 216 K/uL (ref 150–400)
RBC: 5.03 MIL/uL (ref 4.22–5.81)
RDW: 14.2 % (ref 11.5–15.5)
WBC: 22.4 10*3/uL — ABNORMAL HIGH (ref 4.0–10.5)

## 2017-11-10 LAB — C DIFFICILE QUICK SCREEN W PCR REFLEX
C DIFFICILE (CDIFF) TOXIN: POSITIVE — AB
C DIFFICLE (CDIFF) ANTIGEN: POSITIVE — AB
C Diff interpretation: DETECTED

## 2017-11-10 LAB — OCCULT BLOOD, POC DEVICE: Fecal Occult Bld: POSITIVE — AB

## 2017-11-10 LAB — BASIC METABOLIC PANEL WITH GFR
BUN: 25 mg/dL — ABNORMAL HIGH (ref 6–20)
Chloride: 103 mmol/L (ref 101–111)
Glucose, Bld: 156 mg/dL — ABNORMAL HIGH (ref 65–99)
Potassium: 3.7 mmol/L (ref 3.5–5.1)

## 2017-11-10 LAB — MAGNESIUM: Magnesium: 1.7 mg/dL (ref 1.7–2.4)

## 2017-11-10 MED ORDER — VANCOMYCIN 50 MG/ML ORAL SOLUTION
125.0000 mg | Freq: Four times a day (QID) | ORAL | Status: DC
  Administered 2017-11-10 – 2017-11-11 (×5): 125 mg via ORAL
  Filled 2017-11-10 (×18): qty 2.5

## 2017-11-10 NOTE — Progress Notes (Signed)
Critical lab value received. Patient is positive for C-diff antigen and toxins. Dr. Maudie Mercury notified. Received call back from MD confirming pt is receiving Flagyl.

## 2017-11-10 NOTE — Progress Notes (Signed)
PROGRESS NOTE    Marcus [name]  YJE:[id number] DOB: 04/13/1946 DOA: 11/09/2017 PCP: Deloria Lair., MD     Brief Narrative:  72 year old man admitted from home on 4/17 due to 3-4-week history of profuse diarrhea.  He has become weaker.  On admission he was found to have acute renal failure and admission was requested.   Assessment & Plan:   Principal Problem:   C. difficile colitis Active Problems:   GOUT   Atrial flutter (HCC)   C. difficile colitis -Cipro and Flagyl have been discontinued, will start oral vancomycin. -Still with significant stool volume. -Plan to continue IV fluids, hope for discharge home over next 24 hours with decreased stool burden.  Acute renal failure -Likely due to GI losses from profuse diarrhea with C. difficile colitis -Creatinine is down to 1.1 from 1.32, continue IV fluids.  Atrial flutter/fibrillation -Continue metoprolol, aspirin, he is status post ablation.  Gout -Continue allopurinol  Diabetes -Is diet controlled at home. -Do not belie CBGs have been fairly well controlled.   DVT prophylaxis: Lovenox Code Status: Full code Family Communication:  wife at bedside updated on plan of care and all questions answered Disposition Plan: Anticipate discharge home over the next 24 hours as long as stool burden improved  Consultants:   None  Procedures:   None  Antimicrobials:  Anti-infectives (From admission, onward)   Start     Dose/Rate Route Frequency Ordered Stop   11/10/17 1100  vancomycin (VANCOCIN) 50 mg/mL oral solution 125 mg     125 mg Oral 4 times daily 11/10/17 0947 11/20/17 0959   11/10/17 1000  ciprofloxacin (CIPRO) IVPB 200 mg  Status:  Discontinued     200 mg 100 mL/hr over 60 Minutes Intravenous Every 12 hours 11/09/17 2305 11/10/17 0947   11/10/17 0600  metroNIDAZOLE (FLAGYL) IVPB 500 mg  Status:  Discontinued     500 mg 100 mL/hr over 60 Minutes Intravenous Every 8 hours 11/09/17 2305 11/10/17  0947   11/09/17 2300  metroNIDAZOLE (FLAGYL) IVPB 500 mg     500 mg 100 mL/hr over 60 Minutes Intravenous  Once 11/09/17 2256 11/10/17 0049   11/09/17 2115  ciprofloxacin (CIPRO) IVPB 400 mg     400 mg 200 mL/hr over 60 Minutes Intravenous  Once 11/09/17 2103 11/09/17 2234   11/09/17 2115  metroNIDAZOLE (FLAGYL) IVPB 500 mg  Status:  Discontinued     500 mg 100 mL/hr over 60 Minutes Intravenous  Once 11/09/17 2103 11/09/17 2256       Subjective: Still feels very weak, abdominal cramping has improved, had at least 6-7 stools overnight.  Objective: Vitals:   11/09/17 2311 11/09/17 2346 11/10/17 0433 11/10/17 1441  BP: 121/75 125/75 127/82 103/74  Pulse: 84 84 96 90  Resp: 18  18 (!) 21  Temp: 98.3 F (36.8 C)  98.3 F (36.8 C) 98.7 F (37.1 C)  TempSrc: Oral  Oral Oral  SpO2: 95%  92% 99%  Weight: 83.8 kg (184 lb 11.9 oz)     Height: 6' (1.829 m)       Intake/Output Summary (Last 24 hours) at 11/10/2017 1623 Last data filed at 11/10/2017 1531 Gross per 24 hour  Intake 3291.67 ml  Output -  Net 3291.67 ml   Filed Weights   11/09/17 1317 11/09/17 2311  Weight: 86.6 kg (191 lb) 83.8 kg (184 lb 11.9 oz)    Examination:  General exam: Alert, awake, oriented x 3 Respiratory system: Clear to auscultation.  Respiratory effort normal. Cardiovascular system:RRR. No murmurs, rubs, gallops. Gastrointestinal system: Abdomen is nondistended, soft and nontender. No organomegaly or masses felt. Normal bowel sounds heard. Central nervous system: Alert and oriented. No focal neurological deficits. Extremities: No C/C/E, +pedal pulses Skin: No rashes, lesions or ulcers Psychiatry: Judgement and insight appear normal. Mood & affect appropriate.     Data Reviewed: I have personally reviewed following labs and imaging studies  CBC: Recent Labs  Lab 11/09/17 1411 11/09/17 1803 11/10/17 0423  WBC 24.3*  --  22.4*  NEUTROABS  --  22.0*  --   HGB 17.4*  --  15.5  HCT 51.6  --   47.2  MCV 93.8  --  93.8  PLT 250  --  967   Basic Metabolic Panel: Recent Labs  Lab 11/09/17 1411 11/10/17 0423  NA 138 137  K 4.3 3.7  CL 102 103  CO2 22 21*  GLUCOSE 177* 156*  BUN 26* 25*  CREATININE 1.32* 1.10  CALCIUM 8.8* 8.2*  MG  --  1.7   GFR: Estimated Creatinine Clearance: 64.7 mL/min (by C-G formula based on SCr of 1.1 mg/dL). Liver Function Tests: Recent Labs  Lab 11/09/17 1411  AST 23  ALT 20  ALKPHOS 90  BILITOT 1.3*  PROT 6.6  ALBUMIN 3.0*   Recent Labs  Lab 11/09/17 1411  LIPASE 28   No results for input(s): AMMONIA in the last 168 hours. Coagulation Profile: No results for input(s): INR, PROTIME in the last 168 hours. Cardiac Enzymes: No results for input(s): CKTOTAL, CKMB, CKMBINDEX, TROPONINI in the last 168 hours. BNP (last 3 results) No results for input(s): PROBNP in the last 8760 hours. HbA1C: No results for input(s): HGBA1C in the last 72 hours. CBG: Recent Labs  Lab 11/10/17 0759 11/10/17 1208  GLUCAP 140* 171*   Lipid Profile: No results for input(s): CHOL, HDL, LDLCALC, TRIG, CHOLHDL, LDLDIRECT in the last 72 hours. Thyroid Function Tests: No results for input(s): TSH, T4TOTAL, FREET4, T3FREE, THYROIDAB in the last 72 hours. Anemia Panel: No results for input(s): VITAMINB12, FOLATE, FERRITIN, TIBC, IRON, RETICCTPCT in the last 72 hours. Urine analysis:    Component Value Date/Time   COLORURINE AMBER (A) 11/09/2017 1324   APPEARANCEUR CLEAR 11/09/2017 1324   LABSPEC 1.021 11/09/2017 1324   PHURINE 5.0 11/09/2017 1324   GLUCOSEU NEGATIVE 11/09/2017 1324   HGBUR SMALL (A) 11/09/2017 1324   BILIRUBINUR NEGATIVE 11/09/2017 1324   KETONESUR NEGATIVE 11/09/2017 1324   PROTEINUR 30 (A) 11/09/2017 1324   UROBILINOGEN 1.0 07/21/2013 2130   NITRITE NEGATIVE 11/09/2017 1324   LEUKOCYTESUR NEGATIVE 11/09/2017 1324   Sepsis Labs: @LABRCNTIP (procalcitonin:4,lacticidven:4)  ) Recent Results (from the past 240 hour(s))  C  difficile quick scan w PCR reflex     Status: Abnormal   Collection Time: 11/09/17  5:57 PM  Result Value Ref Range Status   C Diff antigen POSITIVE (A) NEGATIVE Final   C Diff toxin POSITIVE (A) NEGATIVE Final   C Diff interpretation Toxin producing C. difficile detected.  Final    Comment: CRITICAL RESULT CALLED TO, READ BACK BY AND VERIFIED WITH: ZSAZSA,RN @ 0020 ON 4.18.19 BY BOWMAN,L Performed at [facility], [address], [place], [place] [address]          Radiology Studies: Ct Abdomen Pelvis Wo Contrast  Result Date: 11/09/2017 CLINICAL DATA:  72 y/o M; lower abdominal pain with diarrhea for 4 weeks. EXAM: CT ABDOMEN AND PELVIS WITHOUT CONTRAST TECHNIQUE: Multidetector CT imaging of  the abdomen and pelvis was performed following the standard protocol without IV contrast. COMPARISON:  03/10/2009 CT abdomen and pelvis FINDINGS: Lower chest: No acute abnormality. Hepatobiliary: No focal liver lesion. Punctate gallstone. No biliary dilatation. No gallbladder wall thickening. Pancreas: Unremarkable. No pancreatic ductal dilatation or surrounding inflammatory changes. Spleen: Normal in size without focal abnormality. Adrenals/Urinary Tract: Small nonobstructing kidney stones are present in interpolar and lower pole of left kidney measuring up to 3 mm. Normal adrenal glands. No focal kidney lesion. No hydronephrosis. Normal bladder. Stomach/Bowel: No obstructive or inflammatory changes of the stomach and small bowel. Diffuse extensive wall thickening of the colon and rectum with pericolonic fat stranding. No evidence for perforation or abscess. Normal appendix. Vascular/Lymphatic: Aortic atherosclerosis. No enlarged abdominal or pelvic lymph nodes. Reproductive: Prostate is unremarkable. Other: Trace ascites. Musculoskeletal: No fracture is seen. Partially visualized right hip intramedullary nail with heterotopic calcification superior to the greater trochanter. IMPRESSION: 1. Acute pancolitis.  No evidence for perforation or abscess. Trace ascites, likely reactive inflammation. 2. Cholelithiasis and left kidney nonobstructive nephrolithiasis. 3. Aortic atherosclerosis. Electronically Signed   By: Kristine Garbe M.D.   On: 11/09/2017 20:49        Scheduled Meds: . allopurinol  300 mg Oral Daily  . aspirin EC  81 mg Oral Daily  . enoxaparin (LOVENOX) injection  40 mg Subcutaneous Q24H  . insulin aspart  0-9 Units Subcutaneous TID WC  . metoprolol tartrate  100 mg Oral BID  . vancomycin  125 mg Oral QID   Continuous Infusions: . sodium chloride Stopped (11/09/17 2055)  . lactated ringers 100 mL/hr at 11/10/17 1612     LOS: 0 days    Time spent: 25 minutes.     Lelon Frohlich, MD [facility] Pager [phone]  If 7PM-7AM, please contact night-coverage [email or web address] Password [facility] 11/10/2017, 4:23 PM

## 2017-11-10 NOTE — Care Management Obs Status (Signed)
[facility] NOTIFICATION   Patient Details  Name: Marcus [name] MRN: [id number] Date of Birth: [birth year]   Medicare Observation Status Notification Given:  Yes    Sherald Barge, RN 11/10/2017, 1:40 PM

## 2017-11-11 LAB — GLUCOSE, CAPILLARY
Glucose-Capillary: 109 mg/dL — ABNORMAL HIGH (ref 65–99)
Glucose-Capillary: 156 mg/dL — ABNORMAL HIGH (ref 65–99)

## 2017-11-11 MED ORDER — VANCOMYCIN 50 MG/ML ORAL SOLUTION: 125.0000 mg | Freq: Four times a day (QID) | ORAL | 0 refills | Status: AC

## 2017-11-11 NOTE — Discharge Summary (Signed)
Physician Discharge Summary  Marcus [name] IEP:[id number] DOB: 1946/06/20 DOA: 11/09/2017  PCP: Marcus [name]., MD  Admit date: 11/09/2017 Discharge date: 11/11/2017  Time spent: 45 minutes  Recommendations for Outpatient Follow-up:  -Will be discharged home today. -Will need to take PO vancomycin for 8 more days for treatment of his c diff colitis.   Discharge Diagnoses:  Principal Problem:   C. difficile colitis Active Problems:   GOUT   Atrial flutter ([place])   Discharge Condition: Stable and improved  Filed Weights   11/09/17 1317 11/09/17 2311  Weight: 86.6 kg (191 lb) 83.8 kg (184 lb 11.9 oz)    History of present illness:  As per Dr. Herbert Moors on 4/17: Marcus [name] is a 72 y.o. male with medical history significant of atrial flutter status post ablation not on anticoagulation, diet-controlled diabetes, gout, history of Guillain-Barr syndrome coming in with diarrhea for 4 weeks and found to have mild AK I and pancolitis.  Patient reports that approximately 3-4 weeks ago he began to have approximately 6 bowel movements a day.  He denied any blood in the bowel movements.  He also developed vague crampy suprapubic abdominal pain.  Pain is only intermittent and is not exacerbated by eating.  It is not relieved by having a bowel movement.  He reports that he did not immediately use the restroom after eating.  He did not soil himself at night.  He denies any nausea or vomiting during this time.  He has noted his intake is decreased dramatically and so he has lost about 6 pounds.  His last colonoscopy was approximately 2 years ago that showed only some polyps.  He denies any recent travel inside or outside the [place].  He denies any recent consumption of unpasteurized cheeses.  He denies any exposure to farm animals.  He apparently saw his primary care doctor for this however has not received an answer yet.  ED Course: In the ED vitals were unremarkable.  White  count was noted to be 24.3.  Creatinine was 1.32 and BUN was 26 which is above his baseline.  Patient was noted to have elevated lactate of 2.2 and subsequently 2.4.  Patient's glucose was 177.  CT of the abdomen pelvis showed pancolitis.  Patient received IV ciprofloxacin, metronidazole and IV fluids.    Hospital Course:   C. difficile colitis -Continue PO Vancomycin for 8 more days to complete a 10 day course. -6 stools in past 24 hours. -No N/V, able to tolerate POs without any issues.  Acute renal failure -Likely due to GI losses from profuse diarrhea with C. difficile colitis -Creatinine is down to 1.1 from 1.32 on DC.  Atrial flutter/fibrillation -Continue metoprolol, aspirin, he is status post ablation.  Gout -Continue allopurinol  Diabetes -Is diet controlled at home. -Well controlled during this hospitalization.      Procedures:  None   Consultations:  None  Discharge Instructions  Discharge Instructions    Diet - low sodium heart healthy   Complete by:  As directed    Increase activity slowly   Complete by:  As directed      Allergies as of 11/11/2017      Reactions   Influenza Vaccines Other (See Comments)   Guillain-Barre syndrome      Medication List    STOP taking these medications   allopurinol 300 MG tablet Commonly known as:  ZYLOPRIM     TAKE these medications   aspirin EC  81 MG tablet Take 1 tablet (81 mg total) by mouth daily.   metoprolol tartrate 100 MG tablet Commonly known as:  LOPRESSOR Take 1 tablet (100 mg total) by mouth 2 (two) times daily.   vancomycin 50 mg/mL oral solution Commonly known as:  VANCOCIN Take 2.5 mLs (125 mg total) by mouth 4 (four) times daily for 8 days.      Allergies  Allergen Reactions  . Influenza Vaccines Other (See Comments)    Guillain-Barre syndrome   Follow-up Information    Marcus [name]., MD. Schedule an appointment as soon as possible for a visit in 2 week(s).     Specialty:  Family Medicine Contact information: [address] [place] [address] [phone]            The results of significant diagnostics from this hospitalization (including imaging, microbiology, ancillary and laboratory) are listed below for reference.    Significant Diagnostic Studies: Ct Abdomen Pelvis Wo Contrast  Result Date: 11/09/2017 CLINICAL DATA:  72 y/o M; lower abdominal pain with diarrhea for 4 weeks. EXAM: CT ABDOMEN AND PELVIS WITHOUT CONTRAST TECHNIQUE: Multidetector CT imaging of the abdomen and pelvis was performed following the standard protocol without IV contrast. COMPARISON:  03/10/2009 CT abdomen and pelvis FINDINGS: Lower chest: No acute abnormality. Hepatobiliary: No focal liver lesion. Punctate gallstone. No biliary dilatation. No gallbladder wall thickening. Pancreas: Unremarkable. No pancreatic ductal dilatation or surrounding inflammatory changes. Spleen: Normal in size without focal abnormality. Adrenals/Urinary Tract: Small nonobstructing kidney stones are present in interpolar and lower pole of left kidney measuring up to 3 mm. Normal adrenal glands. No focal kidney lesion. No hydronephrosis. Normal bladder. Stomach/Bowel: No obstructive or inflammatory changes of the stomach and small bowel. Diffuse extensive wall thickening of the colon and rectum with pericolonic fat stranding. No evidence for perforation or abscess. Normal appendix. Vascular/Lymphatic: Aortic atherosclerosis. No enlarged abdominal or pelvic lymph nodes. Reproductive: Prostate is unremarkable. Other: Trace ascites. Musculoskeletal: No fracture is seen. Partially visualized right hip intramedullary nail with heterotopic calcification superior to the greater trochanter. IMPRESSION: 1. Acute pancolitis. No evidence for perforation or abscess. Trace ascites, likely reactive inflammation. 2. Cholelithiasis and left kidney nonobstructive nephrolithiasis. 3. Aortic atherosclerosis.  Electronically Signed   By: Kristine Garbe M.D.   On: 11/09/2017 20:49    Microbiology: Recent Results (from the past 240 hour(s))  Gastrointestinal Panel by PCR , Stool     Status: None   Collection Time: 11/09/17  5:57 PM  Result Value Ref Range Status   Campylobacter species NOT DETECTED NOT DETECTED Final   Plesimonas shigelloides NOT DETECTED NOT DETECTED Final   Salmonella species NOT DETECTED NOT DETECTED Final   Yersinia enterocolitica NOT DETECTED NOT DETECTED Final   Vibrio species NOT DETECTED NOT DETECTED Final   Vibrio cholerae NOT DETECTED NOT DETECTED Final   Enteroaggregative E coli (EAEC) NOT DETECTED NOT DETECTED Final   Enteropathogenic E coli (EPEC) NOT DETECTED NOT DETECTED Final   Enterotoxigenic E coli (ETEC) NOT DETECTED NOT DETECTED Final   Shiga like toxin producing E coli (STEC) NOT DETECTED NOT DETECTED Final   Shigella/Enteroinvasive E coli (EIEC) NOT DETECTED NOT DETECTED Final   Cryptosporidium NOT DETECTED NOT DETECTED Final   Cyclospora cayetanensis NOT DETECTED NOT DETECTED Final   Entamoeba histolytica NOT DETECTED NOT DETECTED Final   Giardia lamblia NOT DETECTED NOT DETECTED Final   Adenovirus F40/41 NOT DETECTED NOT DETECTED Final   Astrovirus NOT DETECTED NOT DETECTED Final  Norovirus GI/GII NOT DETECTED NOT DETECTED Final   Rotavirus A NOT DETECTED NOT DETECTED Final   Sapovirus (I, II, IV, and V) NOT DETECTED NOT DETECTED Final    Comment: Performed at [facility], [address] [place], [place] [address]  C difficile quick scan w PCR reflex     Status: Abnormal   Collection Time: 11/09/17  5:57 PM  Result Value Ref Range Status   C Diff antigen POSITIVE (A) NEGATIVE Final   C Diff toxin POSITIVE (A) NEGATIVE Final   C Diff interpretation Toxin producing C. difficile detected.  Final    Comment: CRITICAL RESULT CALLED TO, READ BACK BY AND VERIFIED WITH: ZSAZSA,RN @ 0020 ON 4.18.19 BY BOWMAN,L Performed at [facility], [address], [place], [place] [address]      Labs: Basic Metabolic Panel: Recent Labs  Lab 11/09/17 1411 11/10/17 0423  NA 138 137  K 4.3 3.7  CL 102 103  CO2 22 21*  GLUCOSE 177* 156*  BUN 26* 25*  CREATININE 1.32* 1.10  CALCIUM 8.8* 8.2*  MG  --  1.7   Liver Function Tests: Recent Labs  Lab 11/09/17 1411  AST 23  ALT 20  ALKPHOS 90  BILITOT 1.3*  PROT 6.6  ALBUMIN 3.0*   Recent Labs  Lab 11/09/17 1411  LIPASE 28   No results for input(s): AMMONIA in the last 168 hours. CBC: Recent Labs  Lab 11/09/17 1411 11/09/17 1803 11/10/17 0423  WBC 24.3*  --  22.4*  NEUTROABS  --  22.0*  --   HGB 17.4*  --  15.5  HCT 51.6  --  47.2  MCV 93.8  --  93.8  PLT 250  --  216   Cardiac Enzymes: No results for input(s): CKTOTAL, CKMB, CKMBINDEX, TROPONINI in the last 168 hours. BNP: BNP (last 3 results) No results for input(s): BNP in the last 8760 hours.  ProBNP (last 3 results) No results for input(s): PROBNP in the last 8760 hours.  CBG: Recent Labs  Lab 11/10/17 1208 11/10/17 1702 11/10/17 2111 11/11/17 0758 11/11/17 1209  GLUCAP 171* 153* 132* 109* 156*       Signed:  Lelon Frohlich  [facility] Pager: [phone] 11/11/2017, 1:37 PM

## 2017-11-11 NOTE — Progress Notes (Signed)
Discharge instructions gone over with patient, verbalized understanding. IV removed, patient tolerated procedure well. Printed prescription given to patient.

## 2017-11-11 NOTE — Evaluation (Signed)
Physical Therapy Evaluation Patient Details Name: Elisah Parmer MRN: [id number] DOB: 1946-04-16 Today's Date: 11/11/2017   History of Present Illness  Pawan Knechtel is a 72 y.o. male with medical history significant of atrial flutter status post ablation not on anticoagulation, diet-controlled diabetes, gout, history of Guillain-Barr syndrome coming in with diarrhea for 4 weeks and found to have mild AK I and pancolitis.  Patient reports that approximately 3-4 weeks ago he began to have approximately 6 bowel movements a day.  He denied any blood in the bowel movements.  He also developed vague crampy suprapubic abdominal pain.  Pain is only intermittent and is not exacerbated by eating.  It is not relieved by having a bowel movement.  He reports that he did not immediately use the restroom after eating.  He did not soil himself at night.  He denies any nausea or vomiting during this time.  He has noted his intake is decreased dramatically and so he has lost about 6 pounds.  His last colonoscopy was approximately 2 years ago that showed only some polyps.  He denies any recent travel inside or outside the [place].  He denies any recent consumption of unpasteurized cheeses.  He denies any exposure to farm animals.  He apparently saw his primary care doctor for this however has not received an answer yet.    Clinical Impression  Patient functioning at baseline for functional mobility and gait.  Plan: patient discharged from physical therapy to care of nursing for ambulation daily as tolerated for length of stay.    Follow Up Recommendations No PT follow up    Equipment Recommendations  None recommended by PT    Recommendations for Other Services       Precautions / Restrictions Precautions Precautions: None Restrictions Weight Bearing Restrictions: No      Mobility  Bed Mobility Overal bed mobility: Independent                Transfers Overall transfer level:  Independent                  Ambulation/Gait Ambulation/Gait assistance: Independent Ambulation Distance (Feet): 150 Feet Assistive device: None Gait Pattern/deviations: WFL(Within Functional Limits) Gait velocity: slightly decreased      Stairs Stairs: Yes Stairs assistance: Modified independent (Device/Increase time) Stair Management: Alternating pattern;One rail Left;One rail Right Number of Stairs: 18 General stair comments: demonstrates good return for going up/down 18 steps using 1 siderail without loss of balance  Wheelchair Mobility    Modified Rankin (Stroke Patients Only)       Balance Overall balance assessment: No apparent balance deficits (not formally assessed)                                           Pertinent Vitals/Pain Pain Assessment: No/denies pain    Home Living Family/patient expects to be discharged to:: Private residence Living Arrangements: Spouse/significant other Available Help at Discharge: Family Type of Home: House Home Access: Ramped entrance     Home Layout: Two level;Able to live on main level with bedroom/bathroom Home Equipment: Kasandra Knudsen - single point;Walker - 2 wheels;Bedside commode;Shower seat;Wheelchair - manual      Prior Function Level of Independence: Independent               Hand Dominance   Dominant Hand: Right    Extremity/Trunk Assessment  Upper Extremity Assessment Upper Extremity Assessment: Overall WFL for tasks assessed    Lower Extremity Assessment Lower Extremity Assessment: Overall WFL for tasks assessed    Cervical / Trunk Assessment Cervical / Trunk Assessment: Normal  Communication   Communication: No difficulties  Cognition Arousal/Alertness: Awake/alert Behavior During Therapy: WFL for tasks assessed/performed Overall Cognitive Status: Within Functional Limits for tasks assessed                                        General Comments       Exercises     Assessment/Plan    PT Assessment Patent does not need any further PT services  PT Problem List         PT Treatment Interventions      PT Goals (Current goals can be found in the Care Plan section)  Acute Rehab PT Goals Patient Stated Goal: return home PT Goal Formulation: With patient/family Time For Goal Achievement: 11/21/17 Potential to Achieve Goals: Good    Frequency     Barriers to discharge        Co-evaluation               AM-PAC PT "6 Clicks" Daily Activity  Outcome Measure Difficulty turning over in bed (including adjusting bedclothes, sheets and blankets)?: None Difficulty moving from lying on back to sitting on the side of the bed? : None Difficulty sitting down on and standing up from a chair with arms (e.g., wheelchair, bedside commode, etc,.)?: None Help needed moving to and from a bed to chair (including a wheelchair)?: None Help needed walking in hospital room?: None Help needed climbing 3-5 steps with a railing? : None 6 Click Score: 24    End of Session   Activity Tolerance: Patient tolerated treatment well Patient left: in chair;with call bell/phone within reach Nurse Communication: Mobility status      Time: 7471-5953 PT Time Calculation (min) (ACUTE ONLY): 23 min   Charges:   PT Evaluation $PT Eval Low Complexity: 1 Low PT Treatments $Gait Training: 23-37 mins   PT G Codes:        12:25 PM, 21-Nov-2017 Lonell Grandchild, MPT Physical Therapist with [facility] [phone] office [phone] mobile phone

## 2017-11-11 NOTE — Care Management Important Message (Signed)
Important Message  Patient Details  Name: Marcus [name] MRN: [id number] Date of Birth: February 01, 1946   Medicare Important Message Given:  Yes    Sherald Barge, RN 11/11/2017, 11:47 AM

## 2017-11-15 LAB — OVA + PARASITE EXAM

## 2017-11-15 LAB — O&P RESULT

## 2017-11-17 ENCOUNTER — Encounter: Payer: Self-pay | Admitting: Internal Medicine

## 2017-11-17 ENCOUNTER — Ambulatory Visit (INDEPENDENT_AMBULATORY_CARE_PROVIDER_SITE_OTHER): Payer: Medicare Other | Admitting: Internal Medicine

## 2017-11-17 VITALS — BP 122/84 | HR 80 | Ht 72.0 in | Wt 193.0 lb

## 2017-11-17 DIAGNOSIS — G610 Guillain-Barre syndrome: Secondary | ICD-10-CM | POA: Diagnosis not present

## 2017-11-17 DIAGNOSIS — I4892 Unspecified atrial flutter: Secondary | ICD-10-CM

## 2017-11-17 DIAGNOSIS — R600 Localized edema: Secondary | ICD-10-CM | POA: Diagnosis not present

## 2017-11-17 DIAGNOSIS — R230 Cyanosis: Secondary | ICD-10-CM | POA: Insufficient documentation

## 2017-11-17 DIAGNOSIS — Z79899 Other long term (current) drug therapy: Secondary | ICD-10-CM

## 2017-11-17 DIAGNOSIS — I872 Venous insufficiency (chronic) (peripheral): Secondary | ICD-10-CM | POA: Diagnosis not present

## 2017-11-17 DIAGNOSIS — R197 Diarrhea, unspecified: Secondary | ICD-10-CM | POA: Diagnosis not present

## 2017-11-17 DIAGNOSIS — A0472 Enterocolitis due to Clostridium difficile, not specified as recurrent: Secondary | ICD-10-CM | POA: Diagnosis not present

## 2017-11-17 MED ORDER — FUROSEMIDE 20 MG PO TABS: 20.0000 mg | ORAL_TABLET | Freq: Every day | ORAL | 3 refills | Status: DC

## 2017-11-17 NOTE — Progress Notes (Signed)
HPI Marcus [name] returns today for followup. He is a very pleasant 72 year old man with hypertension and chronic atrial fibrillation. In the interim he has been stable. He has retired his  work as a Photographer. He admits to dyspnea with exertion. He also has chronic leg pain. He does not feel palpitations. He has developed C.Diff and is improving slowly, now with 3 BM's a day. He has had some worsening peripheral edema. He also notes purple discoloration of his feet and toes which are not tender.  Allergies  Allergen Reactions  . Influenza Vaccines Other (See Comments)    Guillain-Barre syndrome     Current Outpatient Medications  Medication Sig Dispense Refill  . aspirin EC 81 MG tablet Take 1 tablet (81 mg total) by mouth daily. 90 tablet 3  . metoprolol tartrate (LOPRESSOR) 100 MG tablet Take 1 tablet (100 mg total) by mouth 2 (two) times daily. 60 tablet 11  . vancomycin (VANCOCIN) 50 mg/mL oral solution Take 2.5 mLs (125 mg total) by mouth 4 (four) times daily for 8 days. 80 mL 0   No current facility-administered medications for this visit.      Past Medical History:  Diagnosis Date  . AF (atrial fibrillation) ([place])   . Atrial flutter ([place])   . Blue toes    chronic  . GERD (gastroesophageal reflux disease)   . Gout   . Guillain Barr syndrome ([place])     ROS:   All systems reviewed and negative except as noted in the HPI.   Past Surgical History:  Procedure Laterality Date  . catheter ablation  12/23/2004  . COLONOSCOPY N/A 09/18/2014   Procedure: COLONOSCOPY;  Surgeon: Rogene Houston, MD;  Location: [facility];  Service: Endoscopy;  Laterality: N/A;  830  . FEMUR IM NAIL Right 06/25/2013   Procedure: INTRAMEDULLARY (IM) RETROGRADE FEMORAL NAILING;  Surgeon: Marybelle Killings, MD;  Location: [facility];  Service: Orthopedics;  Laterality: Right;     Family History  Problem Relation Age of Onset  . Diabetes Father   . Colon cancer Father   . Colon cancer  Mother      Social History   Socioeconomic History  . Marital status: Married    Spouse name: Not on file  . Number of children: Not on file  . Years of education: Not on file  . Highest education level: Not on file  Occupational History  . Not on file  Social Needs  . Financial resource strain: Not on file  . Food insecurity:    Worry: Not on file    Inability: Not on file  . Transportation needs:    Medical: Not on file    Non-medical: Not on file  Tobacco Use  . Smoking status: Former Smoker    Packs/day: 2.00    Years: 5.00    Pack years: 10.00    Types: Cigarettes    Start date: 11/23/2016    Last attempt to quit: 11/24/2016    Years since quitting: 0.9  . Smokeless tobacco: Never Used  Substance and Sexual Activity  . Alcohol use: No  . Drug use: No  . Sexual activity: Not on file  Lifestyle  . Physical activity:    Days per week: Not on file    Minutes per session: Not on file  . Stress: Not on file  Relationships  . Social connections:    Talks on phone: Not on file    Gets  together: Not on file    Attends religious service: Not on file    Active member of club or organization: Not on file    Attends meetings of clubs or organizations: Not on file    Relationship status: Not on file  . Intimate partner violence:    Fear of current or ex partner: Not on file    Emotionally abused: Not on file    Physically abused: Not on file    Forced sexual activity: Not on file  Other Topics Concern  . Not on file  Social History Narrative  . Not on file     BP 122/84   Pulse 80   Ht 6' (1.829 m)   Wt 193 lb (87.5 kg)   SpO2 97%   BMI 26.18 kg/m   Physical Exam:  stable appearing 72 yo man, NAD HEENT: Unremarkable Neck:  7 cm JVD, no thyromegally Lymphatics:  No adenopathy Back:  No CVA tenderness Lungs:  Clear with no wheezes or rhonchi HEART:  IRegular rate rhythm, no murmurs, no rubs, no clicks Abd:  soft, positive bowel sounds, no organomegally,  no rebound, no guarding Ext: trace pulses, 2+ edema, cyanosis is present, no clubbing, feet are warm Skin:  No rashes no nodules Neuro:  CN II through XII intact, motor grossly intact  EKG - atrial fib with a controlled Vr   Assess/Plan: 1. Atrial fib - his rates are well controlled. We again discussed systemic anti-coagulation. He initially could not afford eliquis. I have asked him to revisit. 2. C Diff - he is almost back to baseline.  3. Chronic venous insufficiency - I have recommended he start low dose lasix and work hard to reduce the salt in his diet. I have asked him to see Dr. Scot Dock. 4. HTN - His blood pressure is well controlled. He will continue his beta blocker.  [name].D.

## 2017-11-17 NOTE — Patient Instructions (Signed)
Medication Instructions:  Your physician recommends that you continue on your current medications as directed. Please refer to the Current Medication list given to you today.   Labwork: Your physician recommends that you return for lab work in: 2 Weeks    Testing/Procedures: NONE   Follow-Up: You have been referred to Deitra Mayo with [facility]    Any Other Special Instructions Will Be Listed Below (If Applicable).     If you need a refill on your cardiac medications before your next appointment, please call your pharmacy.  Thank you for choosing [facility]!

## 2017-11-18 ENCOUNTER — Other Ambulatory Visit: Payer: Self-pay

## 2017-11-18 ENCOUNTER — Telehealth: Payer: Self-pay | Admitting: Internal Medicine

## 2017-11-18 DIAGNOSIS — I639 Cerebral infarction, unspecified: Secondary | ICD-10-CM

## 2017-11-18 MED ORDER — FUROSEMIDE 20 MG PO TABS: 20.0000 mg | ORAL_TABLET | Freq: Every day | ORAL | 3 refills | Status: DC

## 2017-11-18 NOTE — Telephone Encounter (Signed)
New message   Call from [facility] [phone]  Calling to request additional information for benefit review

## 2017-11-18 NOTE — Telephone Encounter (Signed)
error 

## 2017-11-22 ENCOUNTER — Telehealth: Payer: Self-pay | Admitting: Internal Medicine

## 2017-11-22 ENCOUNTER — Other Ambulatory Visit: Payer: Self-pay

## 2017-11-22 DIAGNOSIS — I872 Venous insufficiency (chronic) (peripheral): Secondary | ICD-10-CM

## 2017-11-22 NOTE — Telephone Encounter (Signed)
See phone note from 4/30.

## 2017-11-22 NOTE — Telephone Encounter (Signed)
Spoke with [employer] assistance. Pt information found. Rep. States they need Rx info for pt. Fax sent to [phone] with Pt ID # [id number]. Direct number to [employer] 0[employer]  [phone] with and questions.

## 2017-11-22 NOTE — Telephone Encounter (Signed)
Please contact [employer] Support at [phone] as they have called X 2 concerning this matter. Thanks.

## 2017-11-22 NOTE — Telephone Encounter (Signed)
New Message:    Call from [facility] [employer] [phone]  Calling to request additional information for pt's prescription insurance

## 2017-11-22 NOTE — Telephone Encounter (Signed)
Pt and Dr. Lovena Le inquired about the price of Eliquis to the pt at last office visit. Pt and Dr. Lovena Le both signed [employer] form at that visit and it was faxed.

## 2017-11-22 NOTE — Telephone Encounter (Signed)
**Note De-Identified  Obfuscation** I called [employer] Support at [phone] and s/w Jasmine. Per Delana Meyer she cannot locate the pt in their system and she states that they have to have permission from the pt to talk to me concerning him.  I advised Delana Meyer that we received a call from [place] on 4/26 and today (4/30) but she again stated that she cannot locate the pt in their system.  I called the pt who states that Dr Lovena Le did mention Eliquis to him at his office visit on 4/25 in our [place] office but he states that he is unaware of [employer] Support.  I am forwarding this message to our [place] office to see if they are aware of this as there are no notes in the pts chart indicating who started this request from [employer] Support.

## 2017-11-24 ENCOUNTER — Encounter (INDEPENDENT_AMBULATORY_CARE_PROVIDER_SITE_OTHER): Payer: Self-pay | Admitting: Internal Medicine

## 2017-11-24 ENCOUNTER — Ambulatory Visit (INDEPENDENT_AMBULATORY_CARE_PROVIDER_SITE_OTHER): Payer: Medicare Other | Admitting: Internal Medicine

## 2017-11-24 VITALS — BP 100/72 | HR 64 | Temp 98.0°F | Ht 72.0 in | Wt 185.7 lb

## 2017-11-24 DIAGNOSIS — A0472 Enterocolitis due to Clostridium difficile, not specified as recurrent: Secondary | ICD-10-CM

## 2017-11-24 DIAGNOSIS — I639 Cerebral infarction, unspecified: Secondary | ICD-10-CM

## 2017-11-24 NOTE — Progress Notes (Signed)
Subjective:    Patient ID: Marcus [name], male    DOB: September 20, 1945, 72 y.o.   MRN: [id number]  HPI Referred by Dr. Rayna Sexton office for diarrhea.  Apparently tested positive for C-diff in April of this year. Stool for O and P were negative.  Seen in the ED last month for diarrhea. Underwent a CT which revealed IMPRESSION: 1. Acute pancolitis. No evidence for perforation or abscess. Trace ascites, likely reactive inflammation. 2. Cholelithiasis and left kidney nonobstructive nephrolithiasis. 3. Aortic atherosclerosis. He tells me today he is not having any diarrhea.  Has not had diarrhea in the past week. Stools are formed. No BRRB. His appetite is okay. No weight loss. He was covered with Vancomycin 121m QID x 8 days.  This is his first episode of C-diff.    Underwent a colonoscopy in February of 2016 (Family hx of colon cancer father age 5854and mother CRC at age 74.    Small polyp at ascending colon was cold snared. Residual piece was removed with cold biopsy forceps. 5 mm polyp cold snared from hepatic flexure. Mucosa of  rest of the colon and rectum was normal. Small hemorrhoids below the dentate line.  Patient had 2 small polyps removed from proximal colon. One is tubular adenoma and the other one is sessile serrated polyp. Results reviewed with patient.  Next colonoscopy in 5 years Review of Systems Past Medical History:  Diagnosis Date  . [place]   . Atrial flutter ([place]   . Blue toes    chronic  . GERD (gastroesophageal reflux disease)   . Gout   . Guillain Barr syndrome ([facility]     Past Surgical History:  Procedure Laterality Date  . catheter ablation  12/23/2004  . COLONOSCOPY N/A 09/18/2014   Procedure: COLONOSCOPY;  Surgeon: NRogene Houston MD;  [facility];  Service: Endoscopy;  Laterality: N/A;  830  . FEMUR IM NAIL Right 06/25/2013   Procedure: INTRAMEDULLARY (IM) RETROGRADE FEMORAL NAILING;  Surgeon: MMarybelle Killings  MD;  [place]  Service: Orthopedics;  Laterality: Right;    Allergies  Allergen Reactions  . Influenza Vaccines Other (See Comments)    Guillain-Barre syndrome    Current Outpatient Medications on File Prior to Visit  Medication Sig Dispense Refill  . aspirin EC 81 MG tablet Take 1 tablet (81 mg total) by mouth daily. 90 tablet 3  . furosemide (LASIX) 20 MG tablet Take 1 tablet (20 mg total) by mouth daily. 90 tablet 3  . lactobacillus acidophilus (BACID) TABS tablet Take 2 tablets by mouth 3 (three) times daily.    . metoprolol tartrate (LOPRESSOR) 100 MG tablet Take 1 tablet (100 mg total) by mouth 2 (two) times daily. 60 tablet 11   No current facility-administered medications on file prior to visit.         Objective:   Physical Exam Blood pressure 100/72, pulse 64, temperature 98 F (36.7 C), height 6' (1.829 m), weight 185 lb 11.2 oz (84.2 kg). Alert and oriented. Skin warm and dry. Oral mucosa is moist.   . Sclera anicteric, conjunctivae is pink. Thyroid not enlarged. No cervical lymphadenopathy. Lungs clear. Heart regular rate and rhythm.  Abdomen is soft. Bowel sounds are positive. No hepatomegaly. No abdominal masses felt. No tenderness.  No edema to lower extremities.          Assessment & Plan:  C-diff. He was asymptomatic at this time. If diarrhea returns please call  our office.

## 2017-11-24 NOTE — Patient Instructions (Signed)
OV as needed 

## 2017-11-27 ENCOUNTER — Encounter (INDEPENDENT_AMBULATORY_CARE_PROVIDER_SITE_OTHER): Payer: Self-pay | Admitting: Internal Medicine

## 2017-11-28 ENCOUNTER — Telehealth (INDEPENDENT_AMBULATORY_CARE_PROVIDER_SITE_OTHER): Payer: Self-pay | Admitting: Internal Medicine

## 2017-11-28 DIAGNOSIS — A0472 Enterocolitis due to Clostridium difficile, not specified as recurrent: Secondary | ICD-10-CM

## 2017-11-28 NOTE — Telephone Encounter (Signed)
Patient c/o diarrhea. Will get a GI pathogen. Recent hx of c-diff.

## 2017-11-29 ENCOUNTER — Other Ambulatory Visit (HOSPITAL_COMMUNITY)
Admission: RE | Admit: 2017-11-29 | Discharge: 2017-11-29 | Disposition: A | Discharge status: Home / Self Care | Payer: Medicare Other | Source: Ambulatory Visit | Attending: Internal Medicine | Admitting: Internal Medicine

## 2017-11-29 DIAGNOSIS — A0472 Enterocolitis due to Clostridium difficile, not specified as recurrent: Secondary | ICD-10-CM | POA: Diagnosis not present

## 2017-11-29 DIAGNOSIS — Z79899 Other long term (current) drug therapy: Secondary | ICD-10-CM | POA: Insufficient documentation

## 2017-11-29 LAB — BASIC METABOLIC PANEL
Anion gap: 10 (ref 5–15)
BUN: 24 mg/dL — AB (ref 6–20)
CHLORIDE: 101 mmol/L (ref 101–111)
CO2: 28 mmol/L (ref 22–32)
CREATININE: 1.34 mg/dL — AB (ref 0.61–1.24)
Calcium: 9.6 mg/dL (ref 8.9–10.3)
GFR calc Af Amer: 59 mL/min — ABNORMAL LOW (ref >60)
GFR calc non Af Amer: 51 mL/min — ABNORMAL LOW (ref >60)
GLUCOSE: 203 mg/dL — AB (ref 65–99)
Potassium: 4.6 mmol/L (ref 3.5–5.1)
Sodium: 139 mmol/L (ref 135–145)

## 2017-12-01 ENCOUNTER — Telehealth: Payer: Self-pay | Admitting: *Deleted

## 2017-12-01 LAB — GASTROINTESTINAL PATHOGEN PANEL PCR
C. DIFFICILE TOX A/B, PCR: DETECTED — AB
Campylobacter, PCR: NOT DETECTED
Cryptosporidium, PCR: NOT DETECTED
E COLI (ETEC) LT/ST, PCR: NOT DETECTED
E COLI (STEC) STX1/STX2, PCR: NOT DETECTED
E COLI 0157, PCR: NOT DETECTED
Giardia lamblia, PCR: NOT DETECTED
Norovirus, PCR: NOT DETECTED
Rotavirus A, PCR: NOT DETECTED
SALMONELLA, PCR: NOT DETECTED
Shigella, PCR: NOT DETECTED

## 2017-12-01 NOTE — Telephone Encounter (Signed)
Called patient with test results. No answer. Left message to call back.  

## 2017-12-01 NOTE — Telephone Encounter (Signed)
-----   Message from Damian Leavell, RN sent at 12/01/2017  7:39 AM EDT ----- Please call results. ty

## 2017-12-02 ENCOUNTER — Other Ambulatory Visit (INDEPENDENT_AMBULATORY_CARE_PROVIDER_SITE_OTHER): Payer: Self-pay | Admitting: Internal Medicine

## 2017-12-02 MED ORDER — VANCOMYCIN HCL 125 MG PO CAPS: 125.0000 mg | ORAL_CAPSULE | Freq: Three times a day (TID) | ORAL | 0 refills | Status: DC

## 2017-12-02 NOTE — Telephone Encounter (Signed)
Rx for Vanciomycin sent to his pharmacy

## 2017-12-28 ENCOUNTER — Ambulatory Visit (HOSPITAL_COMMUNITY)
Admission: RE | Admit: 2017-12-28 | Discharge: 2017-12-28 | Disposition: A | Discharge status: Home / Self Care | Payer: Medicare Other | Source: Ambulatory Visit | Attending: Vascular Surgery | Admitting: Vascular Surgery

## 2017-12-28 ENCOUNTER — Encounter: Payer: Self-pay | Admitting: Vascular Surgery

## 2017-12-28 ENCOUNTER — Other Ambulatory Visit: Payer: Self-pay

## 2017-12-28 ENCOUNTER — Ambulatory Visit (INDEPENDENT_AMBULATORY_CARE_PROVIDER_SITE_OTHER): Payer: Medicare Other | Admitting: Vascular Surgery

## 2017-12-28 VITALS — BP 145/100 | HR 51 | Temp 98.7°F | Resp 16 | Ht 72.0 in | Wt 192.0 lb

## 2017-12-28 DIAGNOSIS — I739 Peripheral vascular disease, unspecified: Secondary | ICD-10-CM | POA: Diagnosis not present

## 2017-12-28 DIAGNOSIS — I872 Venous insufficiency (chronic) (peripheral): Secondary | ICD-10-CM | POA: Insufficient documentation

## 2017-12-28 DIAGNOSIS — I82411 Acute embolism and thrombosis of right femoral vein: Secondary | ICD-10-CM | POA: Diagnosis not present

## 2017-12-28 DIAGNOSIS — I82401 Acute embolism and thrombosis of unspecified deep veins of right lower extremity: Secondary | ICD-10-CM | POA: Diagnosis not present

## 2017-12-28 DIAGNOSIS — R609 Edema, unspecified: Secondary | ICD-10-CM | POA: Insufficient documentation

## 2017-12-28 DIAGNOSIS — I639 Cerebral infarction, unspecified: Secondary | ICD-10-CM | POA: Diagnosis not present

## 2017-12-28 MED ORDER — RIVAROXABAN (XARELTO) VTE STARTER PACK (15 & 20 MG): ORAL_TABLET | ORAL | 0 refills | Status: DC

## 2017-12-28 NOTE — Progress Notes (Signed)
Patient name: Marcus [name] MRN: [id number] DOB: 06-12-1946 Sex: male  REASON FOR CONSULT:    Chronic venous disease.  The consult is requested by Dr. Lovena Le.  HPI:   Marcus [name] is a pleasant 72 y.o. male, who was referred for evaluation of chronic venous insufficiency.  The patient's main complaint is bluish discoloration of both feet.  This has been chronic.  He denies any significant pain in his lower extremities.  He is unaware of any previous history of DVT or phlebitis.  He denies significant swelling in his lower extremities.  His risk factors for peripheral vascular disease include diabetes, hypertension, hypercholesterolemia, and a remote history of tobacco use.  He denies any family history of premature cardiovascular disease.  He denies any history of chest pain or shortness of breath.   He does have a history of atrial fibrillation and Dr. Lovena Le had previously discussed starting him on anticoagulation but he tells me that this was too expensive and the patient decided not to start anticoagulation.  Past Medical History:  Diagnosis Date  . AF (atrial fibrillation) ([place])   . Atrial flutter ([place])   . Blue toes    chronic  . GERD (gastroesophageal reflux disease)   . Gout   . Guillain Barr syndrome [facility])     Family History  Problem Relation Age of Onset  . Diabetes Father   . Colon cancer Father   . Colon cancer Mother     SOCIAL HISTORY: Social History   Socioeconomic History  . Marital status: Married    Spouse name: Not on file  . Number of children: Not on file  . Years of education: Not on file  . Highest education level: Not on file  Occupational History  . Not on file  Social Needs  . Financial resource strain: Not on file  . Food insecurity:    Worry: Not on file    Inability: Not on file  . Transportation needs:    Medical: Not on file    Non-medical: Not on file  Tobacco Use  . Smoking status: Former Smoker   Packs/day: 2.00    Years: 5.00    Pack years: 10.00    Types: Cigarettes    Start date: 11/23/2016    Last attempt to quit: 11/24/2016    Years since quitting: 1.0  . Smokeless tobacco: Never Used  Substance and Sexual Activity  . Alcohol use: No  . Drug use: No  . Sexual activity: Not on file  Lifestyle  . Physical activity:    Days per week: Not on file    Minutes per session: Not on file  . Stress: Not on file  Relationships  . Social connections:    Talks on phone: Not on file    Gets together: Not on file    Attends religious service: Not on file    Active member of club or organization: Not on file    Attends meetings of clubs or organizations: Not on file    Relationship status: Not on file  . Intimate partner violence:    Fear of current or ex partner: Not on file    Emotionally abused: Not on file    Physically abused: Not on file    Forced sexual activity: Not on file  Other Topics Concern  . Not on file  Social History Narrative  . Not on file    Allergies  Allergen Reactions  . Influenza Vaccines  Other (See Comments)    Guillain-Barre syndrome    Current Outpatient Medications  Medication Sig Dispense Refill  . aspirin EC 81 MG tablet Take 1 tablet (81 mg total) by mouth daily. 90 tablet 3  . furosemide (LASIX) 20 MG tablet Take 1 tablet (20 mg total) by mouth daily. 90 tablet 3  . lactobacillus acidophilus (BACID) TABS tablet Take 2 tablets by mouth 3 (three) times daily.    . metoprolol tartrate (LOPRESSOR) 100 MG tablet Take 1 tablet (100 mg total) by mouth 2 (two) times daily. 60 tablet 11   No current facility-administered medications for this visit.     REVIEW OF SYSTEMS:  [X]  denotes positive finding, [ ]  denotes negative finding Cardiac  Comments:  Chest pain or chest pressure:    Shortness of breath upon exertion:    Short of breath when lying flat:    Irregular heart rhythm:        Vascular    Pain in calf, thigh, or hip brought on by  ambulation:    Pain in feet at night that wakes you up from your sleep:     Blood clot in your veins:    Leg swelling:  x       Pulmonary    Oxygen at home:    Productive cough:     Wheezing:         Neurologic    Sudden weakness in arms or legs:     Sudden numbness in arms or legs:     Sudden onset of difficulty speaking or slurred speech:    Temporary loss of vision in one eye:     Problems with dizziness:         Gastrointestinal    Blood in stool:     Vomited blood:         Genitourinary    Burning when urinating:     Blood in urine:        Psychiatric    Major depression:         Hematologic    Bleeding problems:    Problems with blood clotting too easily:        Skin    Rashes or ulcers:        Constitutional    Fever or chills:     PHYSICAL EXAM:   Vitals:   12/28/17 1522  BP: (!) 145/100  Pulse: (!) 51  Resp: 16  Temp: 98.7 F (37.1 C)  TempSrc: Oral  SpO2: 99%  Weight: 192 lb (87.1 kg)  Height: 6' (1.829 m)    GENERAL: The patient is a well-nourished male, in no acute distress. The vital signs are documented above. CARDIAC: There is a regular rate and rhythm.  VASCULAR: I do not detect carotid bruits. On the right side he has a palpable femoral pulse and a palpable posterior tibial pulse.  I cannot palpate a dorsalis pedis pulse. On the left side he has a palpable femoral pulse and dorsalis pedis pulse.  I cannot palpate a posterior tibial pulse on the left. He has mild right lower extremity swelling. He has acrocyanosis of both feet. He has hyperpigmentation bilaterally consistent with chronic venous insufficiency. PULMONARY: There is good air exchange bilaterally without wheezing or rales. ABDOMEN: Soft and non-tender with normal pitched bowel sounds.  I do not appreciate an abdominal aortic aneurysm. MUSCULOSKELETAL: There are no major deformities or cyanosis. NEUROLOGIC: No focal weakness or paresthesias are detected. SKIN: There are no  ulcers or rashes noted. PSYCHIATRIC: The patient has a normal affect.  DATA:    BILATERAL LOWER EXTREMITY VENOUS DUPLEX: I have independently interpreted his bilateral venous duplex study today.  On the right side there is evidence of deep venous thrombosis extending from the distal femoral vein up through the external iliac vein.  It appears that some of the clot in the right leg is chronic however there are areas where he could potentially have some acute clot.  There is deep venous reflux in the popliteal vein.  There is also evidence of superficial thrombophlebitis involving the proximal right great saphenous vein.  On the left side there is no evidence of DVT or superficial thrombophlebitis.  There is deep venous reflux involving the common femoral vein, femoral vein.  There is also some reflux in the left great saphenous vein in the proximal calf and mid calf.  MEDICAL ISSUES:   RIGHT LOWER EXTREMITY DVT: This patient has a new finding of a right lower extremity DVT which extends from his femoral vein up through the iliac vein on the right.  Some of this appears to be chronic however there are areas that appear to be potentially acute.  This reason I recommended that he be anticoagulated and I have asked sent a prescription to his pharmacy for a starter pack of Xarelto.  I have recommended that we continue this for at least 3 months and then repeat his duplex at that time.  Given that some of the clot may be acute I have explained that without anticoagulation he would be at increased risk for a pulmonary embolis.  We have also discussed the importance of leg elevation the proper positioning for this.  I have written him a prescription for a mild compression stocking.  CHRONIC VENOUS INSUFFICIENCY: The patient does have some evidence of deep venous reflux on the right and also some superficial thrombus in the proximal right great saphenous vein.  On the left side he has some deep venous reflux  and some reflux in the left great saphenous vein in the proximal calf and mid calf.  Again I have discussed with him the importance of intermittent leg elevation and the proper positioning for this.  I have encouraged him to avoid prolonged sitting and standing.  I have written him a prescription for compression stockings.  I will see him back in 3 months.  TIBIAL ARTERY OCCLUSIVE DISEASE: He does have evidence of some tibial artery occlusive disease bilaterally.  However he has no significant claudication or rest pain.  Marcus [name] [facility] [phone]

## 2017-12-29 ENCOUNTER — Other Ambulatory Visit: Payer: Self-pay | Admitting: Vascular Surgery

## 2017-12-29 MED ORDER — RIVAROXABAN (XARELTO) VTE STARTER PACK (15 & 20 MG): ORAL_TABLET | ORAL | 0 refills | Status: DC

## 2018-01-04 ENCOUNTER — Other Ambulatory Visit: Payer: Self-pay | Admitting: Internal Medicine

## 2018-01-06 ENCOUNTER — Other Ambulatory Visit: Payer: Self-pay

## 2018-01-06 DIAGNOSIS — I82401 Acute embolism and thrombosis of unspecified deep veins of right lower extremity: Secondary | ICD-10-CM

## 2018-01-20 ENCOUNTER — Other Ambulatory Visit: Payer: Self-pay | Admitting: Vascular Surgery

## 2018-01-20 MED ORDER — RIVAROXABAN 20 MG PO TABS: 20.0000 mg | ORAL_TABLET | Freq: Every day | ORAL | 3 refills | Status: DC

## 2018-03-29 ENCOUNTER — Ambulatory Visit (INDEPENDENT_AMBULATORY_CARE_PROVIDER_SITE_OTHER): Payer: Medicare Other | Admitting: Vascular Surgery

## 2018-03-29 ENCOUNTER — Ambulatory Visit (HOSPITAL_COMMUNITY)
Admission: RE | Admit: 2018-03-29 | Discharge: 2018-03-29 | Disposition: A | Discharge status: Home / Self Care | Payer: Medicare Other | Source: Ambulatory Visit | Attending: Vascular Surgery | Admitting: Vascular Surgery

## 2018-03-29 ENCOUNTER — Encounter: Payer: Self-pay | Admitting: Vascular Surgery

## 2018-03-29 VITALS — BP 136/84 | HR 76 | Temp 98.0°F | Resp 16 | Ht 72.0 in | Wt 196.0 lb

## 2018-03-29 DIAGNOSIS — I82411 Acute embolism and thrombosis of right femoral vein: Secondary | ICD-10-CM | POA: Diagnosis not present

## 2018-03-29 DIAGNOSIS — I82401 Acute embolism and thrombosis of unspecified deep veins of right lower extremity: Secondary | ICD-10-CM

## 2018-03-29 DIAGNOSIS — I82431 Acute embolism and thrombosis of right popliteal vein: Secondary | ICD-10-CM | POA: Insufficient documentation

## 2018-03-29 DIAGNOSIS — I639 Cerebral infarction, unspecified: Secondary | ICD-10-CM

## 2018-03-29 NOTE — Progress Notes (Signed)
Patient name: Marcus [name] MRN: [id number] DOB: Jul 07, 1946 Sex: male  REASON FOR VISIT:   Follow-up of chronic venous insufficiency.  HPI:   Marcus [name] is a pleasant 72 y.o. male who I saw in consultation on 12/28/2017 with chronic venous insufficiency.  At the time of that visit, his duplex scan showed evidence of a DVT extending from the distal femoral vein up through the external iliac vein.  It appeared that some of the clot was chronic however there was potentially some acute clot.  The patient also had deep venous reflux in the popliteal vein.  There was superficial thrombophlebitis involving the proximal right great saphenous vein.  Given these findings I recommended anticoagulation and he was started on Xarelto.  He comes in for a 65-monthfollow-up visit.  His swelling in his right leg has significantly improved.  He has been wearing his compression stockings and has been elevating his legs.  He is on Xarelto.  He denies any chest pain or significant shortness of breath.  Past Medical History:  Diagnosis Date  . AF (atrial fibrillation) ([place]   . Atrial flutter ([place]   . Blue toes    chronic  . GERD (gastroesophageal reflux disease)   . Gout   . Guillain Barr syndrome ([facility]     Family History  Problem Relation Age of Onset  . Diabetes Father   . Colon cancer Father   . Colon cancer Mother     SOCIAL HISTORY: Social History   Tobacco Use  . Smoking status: Former Smoker    Packs/day: 2.00    Years: 5.00    Pack years: 10.00    Types: Cigarettes    Start date: 11/23/2016    Last attempt to quit: 11/24/2016    Years since quitting: 1.3  . Smokeless tobacco: Never Used  Substance Use Topics  . Alcohol use: No    Allergies  Allergen Reactions  . Influenza Vaccines Other (See Comments)    Guillain-Barre syndrome    Current Outpatient Medications  Medication Sig Dispense Refill  . aspirin EC 81 MG tablet Take 1 tablet (81 mg total) by  mouth daily. 90 tablet 3  . furosemide (LASIX) 20 MG tablet Take 1 tablet (20 mg total) by mouth daily. 90 tablet 3  . metoprolol tartrate (LOPRESSOR) 100 MG tablet TAKE (1) TABLET TWICE DAILY. 180 tablet 3  . rivaroxaban (XARELTO) 20 MG TABS tablet Take 1 tablet (20 mg total) by mouth daily with supper. 30 tablet 3  . lactobacillus acidophilus (BACID) TABS tablet Take 2 tablets by mouth 3 (three) times daily.    . Rivaroxaban 15 & 20 MG TBPK Take as directed on package: Start with one 126mtablet by mouth twice a day with food. On Day 22, switch to one 2073mablet once a day with food. (Patient not taking: Reported on 03/29/2018) 51 each 0   No current facility-administered medications for this visit.     REVIEW OF SYSTEMS:  [X]  denotes positive finding, [ ]  denotes negative finding Cardiac  Comments:  Chest pain or chest pressure:    Shortness of breath upon exertion:    Short of breath when lying flat: x   Irregular heart rhythm:        Vascular    Pain in calf, thigh, or hip brought on by ambulation:    Pain in feet at night that wakes you up from your sleep:     Blood clot in  your veins:    Leg swelling:         Pulmonary    Oxygen at home:    Productive cough:     Wheezing:         Neurologic    Sudden weakness in arms or legs:     Sudden numbness in arms or legs:     Sudden onset of difficulty speaking or slurred speech:    Temporary loss of vision in one eye:     Problems with dizziness:         Gastrointestinal    Blood in stool:     Vomited blood:         Genitourinary    Burning when urinating:     Blood in urine:        Psychiatric    Major depression:         Hematologic    Bleeding problems:    Problems with blood clotting too easily:        Skin    Rashes or ulcers:        Constitutional    Fever or chills:     PHYSICAL EXAM:   Vitals:   03/29/18 1222  BP: 136/84  Pulse: 76  Resp: 16  Temp: 98 F (36.7 C)  SpO2: 98%  Weight: 196 lb (88.9  kg)  Height: 6' (1.829 m)   GENERAL: The patient is a well-nourished male, in no acute distress. The vital signs are documented above. CARDIAC: There is a regular rate and rhythm.  VASCULAR: I do not detect carotid bruits. Currently has no significant lower extremity swelling. PULMONARY: There is good air exchange bilaterally without wheezing or rales. ABDOMEN: Soft and non-tender with normal pitched bowel sounds.  MUSCULOSKELETAL: There are no major deformities or cyanosis. NEUROLOGIC: No focal weakness or paresthesias are detected. SKIN: There are no ulcers or rashes noted. PSYCHIATRIC: The patient has a normal affect.  DATA:    VENOUS DUPLEX: I have independently interpreted his venous duplex scan today.  He has partial recanalization of the popliteal vein femoral vein and common femoral vein.  Also the superficial thrombophlebitis in the great saphenous vein has resolved.  Thus there has been significant improvement compared to his study back in June.   MEDICAL ISSUES:   HISTORY OF RIGHT LOWER EXTREMITY DVT: His venous duplex scan today shows significant improvement in his right lower extremity DVT.  There is recanalization in the femoral vein and the clot in the proximal great saphenous vein on the right has resolved.  I think we should continue his Xarelto for another 3 months and then this can be discontinued at that time.  I have encouraged him to continue to elevate his leg to improve venous blood flow which helps continue resolution of his clot and we have discussed the proper positioning for this.  He will continue to wear his knee-high compression stockings.  I also encouraged him to exercise.  I will see him back in 3 months.  He knows to call sooner if he has problems.  Deitra Mayo [facility] [phone]

## 2018-04-17 DIAGNOSIS — Z85828 Personal history of other malignant neoplasm of skin: Secondary | ICD-10-CM | POA: Diagnosis not present

## 2018-04-17 DIAGNOSIS — L821 Other seborrheic keratosis: Secondary | ICD-10-CM | POA: Diagnosis not present

## 2018-04-17 DIAGNOSIS — L570 Actinic keratosis: Secondary | ICD-10-CM | POA: Diagnosis not present

## 2018-05-29 ENCOUNTER — Ambulatory Visit (INDEPENDENT_AMBULATORY_CARE_PROVIDER_SITE_OTHER): Payer: Medicare Other | Admitting: Internal Medicine

## 2018-05-29 ENCOUNTER — Encounter (INDEPENDENT_AMBULATORY_CARE_PROVIDER_SITE_OTHER): Payer: Self-pay | Admitting: Internal Medicine

## 2018-06-01 ENCOUNTER — Ambulatory Visit (INDEPENDENT_AMBULATORY_CARE_PROVIDER_SITE_OTHER): Payer: Medicare Other | Admitting: Internal Medicine

## 2018-06-01 ENCOUNTER — Encounter (INDEPENDENT_AMBULATORY_CARE_PROVIDER_SITE_OTHER): Payer: Self-pay | Admitting: Internal Medicine

## 2018-06-01 VITALS — BP 150/80 | HR 76 | Temp 97.7°F | Ht 72.0 in | Wt 199.8 lb

## 2018-06-01 DIAGNOSIS — I639 Cerebral infarction, unspecified: Secondary | ICD-10-CM

## 2018-06-01 DIAGNOSIS — A0472 Enterocolitis due to Clostridium difficile, not specified as recurrent: Secondary | ICD-10-CM | POA: Diagnosis not present

## 2018-06-01 NOTE — Progress Notes (Signed)
   Subjective:    Patient ID: Marcus [name], male    DOB: 08-01-1945, 72 y.o.   MRN: [id number]  HPI Here today for f/u. Hx of C-diff x 2. Last treated for C diff in May of 2019. Covered with Vancomycin 145m TID x 14 day. He is not having any problems with his stool. Stools are soft but not really formed. No foul odor. Having one BM a day. Appetite is okay. No weight loss.  He is staying active.   Review of Systems Past Medical History:  Diagnosis Date  . [place]rial [place]) ([place]   . [place] ([place]   . Blue toes    chronic  . GERD (gastroesophageal reflux disease)   . Gout   . Guillain Barr syndrome ([facility]     Past Surgical History:  Procedure Laterality Date  . catheter ablation  12/23/2004  . COLONOSCOPY N/A 09/18/2014   Procedure: COLONOSCOPY;  Surgeon: NRogene Houston MD;  [facility];  Service: Endoscopy;  Laterality: N/A;  830  . FEMUR IM NAIL Right 06/25/2013   Procedure: INTRAMEDULLARY (IM) RETROGRADE FEMORAL NAILING;  Surgeon: MMarybelle Killings MD;  [place]  Service: Orthopedics;  Laterality: Right;    Allergies  Allergen Reactions  . Influenza Vaccines Other (See Comments)    Guillain-Barre syndrome    Current Outpatient Medications on File Prior to Visit  Medication Sig Dispense Refill  . aspirin EC 81 MG tablet Take 1 tablet (81 mg total) by mouth daily. 90 tablet 3  . furosemide (LASIX) 20 MG tablet Take 20 mg by mouth.    . metoprolol tartrate (LOPRESSOR) 100 MG tablet TAKE (1) TABLET TWICE DAILY. 180 tablet 3  . rivaroxaban (XARELTO) 20 MG TABS tablet Take 1 tablet (20 mg total) by mouth daily with supper. 30 tablet 3  . furosemide (LASIX) 20 MG tablet Take 1 tablet (20 mg total) by mouth daily. 90 tablet 3   No current facility-administered medications on file prior to visit.         Objective:   Physical Exam Blood pressure (!) 150/80, pulse 76, temperature 97.7 F (36.5 C), height 6' (1.829 m), weight 199 lb  12.8 oz (90.6 kg). Alert and oriented. Skin warm and dry. Oral mucosa is moist.   . Sclera anicteric, conjunctivae is pink. Thyroid not enlarged. No cervical lymphadenopathy. Lungs clear. Heart regular rate and rhythm.  Abdomen is soft. Bowel sounds are positive. No hepatomegaly. No abdominal masses felt. No tenderness.  No edema to lower extremities.          Assessment & Plan:  C-diff. He seems to be doing well.  Having a BM x 1 a day. No diarrhea.  OV in 1 year.

## 2018-06-01 NOTE — Patient Instructions (Signed)
OV in 1 year.  

## 2018-06-28 ENCOUNTER — Ambulatory Visit: Payer: Medicare Other | Admitting: Vascular Surgery

## 2018-07-05 ENCOUNTER — Ambulatory Visit (INDEPENDENT_AMBULATORY_CARE_PROVIDER_SITE_OTHER): Payer: Medicare Other | Admitting: Vascular Surgery

## 2018-07-05 ENCOUNTER — Encounter: Payer: Self-pay | Admitting: Vascular Surgery

## 2018-07-05 ENCOUNTER — Other Ambulatory Visit: Payer: Self-pay

## 2018-07-05 VITALS — BP 142/94 | HR 85 | Temp 97.1°F | Resp 18 | Ht 72.0 in | Wt 197.0 lb

## 2018-07-05 DIAGNOSIS — I639 Cerebral infarction, unspecified: Secondary | ICD-10-CM

## 2018-07-05 DIAGNOSIS — I872 Venous insufficiency (chronic) (peripheral): Secondary | ICD-10-CM

## 2018-07-05 NOTE — Progress Notes (Signed)
Patient name: Marcus [name] MRN: [id number] DOB: May 12, 1946 Sex: male  REASON FOR VISIT:   Follow-up of chronic venous insufficiency.  HPI:   Marcus [name] is a pleasant 72 y.o. male who I last saw on 03/29/2018.  I had originally seen the patient in consultation in June of this year at which time duplex showed DVT extending from the distal femoral vein up through the external iliac vein.  Some of this clot appeared to be chronic.  The patient also had superficial thrombophlebitis involving the proximal right great saphenous vein.  I placed the patient on Xarelto with a plan to continue this for 3 months.  When I saw him last in follow-up on 03/29/2018 the swelling in the right leg had significantly improved.  Duplex scan at that time showed partial recanalization of the popliteal vein and femoral vein and also the common femoral vein on the right.  The superficial thrombophlebitis in the right great saphenous vein had resolved.  He comes in for 7-monthfollow-up visit.  He tells me that he ran out of Xarelto 3 weeks ago and decided not to finish this.  He has not had any new symptoms in his right leg.  He denies swelling or pain.  I do not get any history of claudication, rest pain, or nonhealing ulcers.  He is not a smoker.  He is on aspirin.  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  Past Medical History:  Diagnosis Date  . AF (atrial fibrillation) ([place]   . At[place] flutter ([place]   . Blue toes    chronic  . GERD (gastroesophageal reflux disease)   . Gout   . Guillain Barr syndrome ([facility]     Family History  Problem Relation Age of Onset  . Diabetes Father   . Colon cancer Father   . Colon cancer Mother     SOCIAL HISTORY: Social History   Tobacco Use  . Smoking status: Former Smoker    Packs/day: 2.00    Years: 5.00    Pack years: 10.00    Types: Cigarettes    Start date: 11/23/2016    Last attempt to quit: 11/24/2016    Years since quitting: 1.6   . Smokeless tobacco: Never Used  Substance Use Topics  . Alcohol use: No    Allergies  Allergen Reactions  . Influenza Vaccines Other (See Comments)    Guillain-Barre syndrome    Current Outpatient Medications  Medication Sig Dispense Refill  . aspirin EC 81 MG tablet Take 1 tablet (81 mg total) by mouth daily. 90 tablet 3  . metoprolol tartrate (LOPRESSOR) 100 MG tablet TAKE (1) TABLET TWICE DAILY. 180 tablet 3  . furosemide (LASIX) 20 MG tablet Take 1 tablet (20 mg total) by mouth daily. 90 tablet 3  . rivaroxaban (XARELTO) 20 MG TABS tablet Take 1 tablet (20 mg total) by mouth daily with supper. (Patient not taking: Reported on 07/05/2018) 30 tablet 3  . vancomycin (VANCOCIN) 125 MG capsule      No current facility-administered medications for this visit.     REVIEW OF SYSTEMS:  [X]  denotes positive finding, [ ]  denotes negative finding Cardiac  Comments:  Chest pain or chest pressure:    Shortness of breath upon exertion:    Short of breath when lying flat:    Irregular heart rhythm:        Vascular    Pain in calf, thigh, or hip brought on by  ambulation:    Pain in feet at night that wakes you up from your sleep:     Blood clot in your veins:    Leg swelling:         Pulmonary    Oxygen at home:    Productive cough:     Wheezing:         Neurologic    Sudden weakness in arms or legs:     Sudden numbness in arms or legs:     Sudden onset of difficulty speaking or slurred speech:    Temporary loss of vision in one eye:     Problems with dizziness:         Gastrointestinal    Blood in stool:     Vomited blood:         Genitourinary    Burning when urinating:     Blood in urine:        Psychiatric    Major depression:         Hematologic    Bleeding problems:    Problems with blood clotting too easily:        Skin    Rashes or ulcers:        Constitutional    Fever or chills:     PHYSICAL EXAM:   Vitals:   07/05/18 0926  BP: (!) 142/94    Pulse: 85  Resp: 18  Temp: (!) 97.1 F (36.2 C)  TempSrc: Oral  SpO2: 98%  Weight: 197 lb (89.4 kg)  Height: 6' (1.829 m)    GENERAL: The patient is a well-nourished male, in no acute distress. The vital signs are documented above. CARDIAC: There is a regular rate and rhythm.  VASCULAR: I do not detect carotid bruits. He has palpable femoral pulses. On the right side he has a palpable posterior tibial pulse with a monophasic anterior tibial signal. On the left side he has a palpable dorsalis pedis pulse with a monophasic posterior tibial signal. He has acrocyanosis of both feet. He has hyperpigmentation bilaterally. He has no significant lower extremity swelling. PULMONARY: There is good air exchange bilaterally without wheezing or rales. ABDOMEN: Soft and non-tender with normal pitched bowel sounds.  MUSCULOSKELETAL: There are no major deformities or cyanosis. NEUROLOGIC: No focal weakness or paresthesias are detected. SKIN: There are no ulcers or rashes noted. PSYCHIATRIC: The patient has a normal affect.  DATA:    No new data  MEDICAL ISSUES:   RIGHT LOWER EXTREMITY DVT: This patient has been on 3 months of Xarelto for his DVT and stopped this on his own 3 weeks ago.  I think this is reasonable.  I have explained that given that he had a DVT he is at slightly increased risk for DVTs in the future.  We discussed the importance of wearing his compression stockings and leg elevation.  ACROCYANOSIS BOTH FEET: The patient does have acrocyanosis of both feet but has a palpable right posterior tibial pulse and palpable left dorsalis pedis pulse.  I think he does have evidence of tibial artery occlusive disease but clearly has adequate circulation given his pulse exam.  Fortunately he is not a smoker.  I encouraged him to stay as active as possible.  I will see him back as needed.  Deitra Mayo [facility] [phone]

## 2018-07-26 HISTORY — PX: EYE SURGERY: SHX253

## 2018-10-06 DIAGNOSIS — E119 Type 2 diabetes mellitus without complications: Secondary | ICD-10-CM | POA: Diagnosis not present

## 2018-11-12 ENCOUNTER — Other Ambulatory Visit: Payer: Self-pay | Admitting: Internal Medicine

## 2018-11-20 ENCOUNTER — Other Ambulatory Visit: Payer: Self-pay | Admitting: Internal Medicine

## 2018-11-21 ENCOUNTER — Other Ambulatory Visit: Payer: Self-pay | Admitting: Internal Medicine

## 2018-11-21 NOTE — Telephone Encounter (Signed)
PLEASE CALL [facility] CONCERNING furosemide (LASIX) 20 MG tablet [[id number]]  PLEASE ASK FOR KEVIN

## 2018-11-21 NOTE — Telephone Encounter (Signed)
Gave verbal RX as their fax is not coning in or going out properly.

## 2019-01-18 DIAGNOSIS — I10 Essential (primary) hypertension: Secondary | ICD-10-CM | POA: Diagnosis not present

## 2019-01-18 DIAGNOSIS — I4891 Unspecified atrial fibrillation: Secondary | ICD-10-CM | POA: Diagnosis not present

## 2019-01-18 DIAGNOSIS — E119 Type 2 diabetes mellitus without complications: Secondary | ICD-10-CM | POA: Diagnosis not present

## 2019-01-18 DIAGNOSIS — R5383 Other fatigue: Secondary | ICD-10-CM | POA: Diagnosis not present

## 2019-01-19 ENCOUNTER — Encounter (HOSPITAL_COMMUNITY): Payer: Self-pay

## 2019-01-19 ENCOUNTER — Other Ambulatory Visit: Payer: Self-pay

## 2019-01-19 ENCOUNTER — Emergency Department (HOSPITAL_COMMUNITY)
Admission: EM | Admit: 2019-01-19 | Discharge: 2019-01-19 | Disposition: A | Discharge status: Home / Self Care | Payer: Medicare Other | Attending: Emergency Medicine | Admitting: Emergency Medicine

## 2019-01-19 DIAGNOSIS — Z79899 Other long term (current) drug therapy: Secondary | ICD-10-CM | POA: Insufficient documentation

## 2019-01-19 DIAGNOSIS — R739 Hyperglycemia, unspecified: Secondary | ICD-10-CM | POA: Diagnosis not present

## 2019-01-19 DIAGNOSIS — Z7982 Long term (current) use of aspirin: Secondary | ICD-10-CM | POA: Diagnosis not present

## 2019-01-19 DIAGNOSIS — E1165 Type 2 diabetes mellitus with hyperglycemia: Secondary | ICD-10-CM | POA: Insufficient documentation

## 2019-01-19 DIAGNOSIS — Z7901 Long term (current) use of anticoagulants: Secondary | ICD-10-CM | POA: Diagnosis not present

## 2019-01-19 DIAGNOSIS — I4891 Unspecified atrial fibrillation: Secondary | ICD-10-CM | POA: Insufficient documentation

## 2019-01-19 DIAGNOSIS — Z87891 Personal history of nicotine dependence: Secondary | ICD-10-CM | POA: Insufficient documentation

## 2019-01-19 LAB — COMPREHENSIVE METABOLIC PANEL
ALT: 22 U/L (ref 0–44)
AST: 21 U/L (ref 15–41)
Albumin: 3.7 g/dL (ref 3.5–5.0)
Alkaline Phosphatase: 114 U/L (ref 38–126)
Anion gap: 16 — ABNORMAL HIGH (ref 5–15)
BUN: 23 mg/dL (ref 8–23)
CO2: 24 mmol/L (ref 22–32)
Calcium: 8.8 mg/dL — ABNORMAL LOW (ref 8.9–10.3)
Chloride: 91 mmol/L — ABNORMAL LOW (ref 98–111)
Creatinine, Ser: 1.39 mg/dL — ABNORMAL HIGH (ref 0.61–1.24)
GFR calc Af Amer: 57 mL/min — ABNORMAL LOW (ref >60)
GFR calc non Af Amer: 49 mL/min — ABNORMAL LOW (ref >60)
Glucose, Bld: 738 mg/dL (ref 70–99)
Potassium: 4.8 mmol/L (ref 3.5–5.1)
Sodium: 131 mmol/L — ABNORMAL LOW (ref 135–145)
Total Bilirubin: 1 mg/dL (ref 0.3–1.2)
Total Protein: 6.5 g/dL (ref 6.5–8.1)

## 2019-01-19 LAB — CBC WITH DIFFERENTIAL/PLATELET
Abs Immature Granulocytes: 0.02 10*3/uL (ref 0.00–0.07)
Basophils Absolute: 0.1 10*3/uL (ref 0.0–0.1)
Basophils Relative: 1 %
Eosinophils Absolute: 0 10*3/uL (ref 0.0–0.5)
Eosinophils Relative: 1 %
HCT: 53.4 % — ABNORMAL HIGH (ref 39.0–52.0)
Hemoglobin: 17.6 g/dL — ABNORMAL HIGH (ref 13.0–17.0)
Immature Granulocytes: 0 %
Lymphocytes Relative: 18 %
Lymphs Abs: 1.2 10*3/uL (ref 0.7–4.0)
MCH: 31.4 pg (ref 26.0–34.0)
MCHC: 33 g/dL (ref 30.0–36.0)
MCV: 95.2 fL (ref 80.0–100.0)
Monocytes Absolute: 0.6 10*3/uL (ref 0.1–1.0)
Monocytes Relative: 9 %
Neutro Abs: 4.7 10*3/uL (ref 1.7–7.7)
Neutrophils Relative %: 71 %
Platelets: 206 10*3/uL (ref 150–400)
RBC: 5.61 MIL/uL (ref 4.22–5.81)
RDW: 12.9 % (ref 11.5–15.5)
WBC: 6.6 10*3/uL (ref 4.0–10.5)
nRBC: 0 % (ref 0.0–0.2)

## 2019-01-19 LAB — URINALYSIS, ROUTINE W REFLEX MICROSCOPIC
Bacteria, UA: NONE SEEN
Bilirubin Urine: NEGATIVE
Glucose, UA: >=500 mg/dL — AB
Hgb urine dipstick: NEGATIVE
Ketones, ur: NEGATIVE mg/dL
Leukocytes,Ua: NEGATIVE
Nitrite: NEGATIVE
Protein, ur: NEGATIVE mg/dL
Specific Gravity, Urine: 1.018 (ref 1.005–1.030)
pH: 5 (ref 5.0–8.0)

## 2019-01-19 LAB — CBG MONITORING, ED
Glucose-Capillary: 453 mg/dL — ABNORMAL HIGH (ref 70–99)
Glucose-Capillary: 294 mg/dL — ABNORMAL HIGH (ref 70–99)

## 2019-01-19 MED ORDER — SODIUM CHLORIDE 0.9 % IV BOLUS
1000.0000 mL | Freq: Once | INTRAVENOUS | Status: AC
  Administered 2019-01-19: 1000 mL via INTRAVENOUS

## 2019-01-19 MED ORDER — INSULIN ASPART 100 UNIT/ML IV SOLN
10.0000 [IU] | Freq: Once | INTRAVENOUS | Status: AC
  Administered 2019-01-19: 10 [IU] via INTRAVENOUS

## 2019-01-19 MED ORDER — METFORMIN HCL ER 500 MG PO TB24: 500.0000 mg | ORAL_TABLET | Freq: Two times a day (BID) | ORAL | 1 refills | Status: DC

## 2019-01-19 NOTE — ED Notes (Signed)
CRITICAL VALUE ALERT  Critical Value:   Glucose 738  Date & Time Notied:  DR Sabra Heck  Provider Notified: 01/19/19 0958  Orders Received/Actions taken:

## 2019-01-19 NOTE — Discharge Instructions (Signed)
Your testing today reveals that your blood sugar was close to 800.  We have reduced it down to 290.  You will need to watch the foods that you eat, the liquids that you drink and make sure that you are taking the medications exactly as prescribed.  Please read the attached instructions.    I have discussed your care with your primary doctor on the phone and he has requested that we start a medication called metformin, 500 mg by mouth twice a day.  Please continue this medication until you follow-up with him. Seek a medical exam in the emergency department for severe or worsening symptoms or for any other questions if you cannot reach her family doctor.

## 2019-01-19 NOTE — ED Triage Notes (Signed)
Pt reports had blood work yesterday with pcp and was called this morning and told his glucose was 871.

## 2019-01-19 NOTE — ED Provider Notes (Addendum)
[facility] EMERGENCY DEPARTMENT Provider Note   CSN: [id number] Arrival date & time: 01/19/19  8366     History   Chief Complaint Chief Complaint  Patient presents with  . Hyperglycemia    HPI Marcus [name] is a 73 y.o. male.     HPI  The patient is a 73 year old male who reports a history of [name] syndrome from 2015, history of atrial fibrillation, history of diabetes in the past though he states that he has not had to have treatment for about 5 years.  The patient takes Xarelto, he has not been on diabetic medications because he states that his A1c level came back improved and his family doctor discontinued his medications many years ago.  He has avoided the doctor since that time but because of approximately 6 to 8 months of a progressive generalized fatigue feeling "weak as water" his wife was finally able to get him to go to the doctor yesterday.  He presents today after being told by his physician that his glucose was severely elevated and that he needed an emergency department evaluation and stabilizing care.  The patient does endorse having mild generalized weakness, urinary frequency, he states he does feel thirsty but does not feel any pain in his chest or abdomen, no pain in his back, no swelling of his legs.  He has known peripheral neuropathy.  His symptoms have been persistent over months, nothing seems to make it better, worse with exertion, not associated with fevers or vomiting.  Reportedly blood sugar was over 800 yesterday per the patient's notes  Additional history obtained from the medical record showing that the patient's last interaction with the [facility] system was in June 2019 when he had vascular surgery evaluation.  Past Medical History:  Diagnosis Date  . AF (atrial fibrillation) ([place])   . Atrial flutter ([place])   . Blue toes    chronic  . GERD (gastroesophageal reflux disease)   . Gout   . Guillain Barr syndrome [facility])     Patient  Active Problem List   Diagnosis Date Noted  . C. difficile colitis 11/09/2017  . Focal neurological deficit 07/21/2013  . GBS (Guillain Barre syndrome) ([place]) 07/21/2013  . Leg weakness 07/21/2013  . Femur fracture ([place]) 06/25/2013  . Femur fracture, right ([place]) 06/25/2013  . GOUT 07/31/2009  . Atrial fibrillation with RVR ([place]) 07/31/2009  . Atrial flutter ([place]) 07/31/2009    Past Surgical History:  Procedure Laterality Date  . catheter ablation  12/23/2004  . COLONOSCOPY N/A 09/18/2014   Procedure: COLONOSCOPY;  Surgeon: Rogene Houston, MD;  [facility];  Service: Endoscopy;  Laterality: N/A;  830  . FEMUR IM NAIL Right 06/25/2013   Procedure: INTRAMEDULLARY (IM) RETROGRADE FEMORAL NAILING;  Surgeon: Marybelle Killings, MD;  Location: [facility];  Service: Orthopedics;  Laterality: Right;        Home Medications    Prior to Admission medications   Medication Sig Start Date End Date Taking? Authorizing Provider  aspirin EC 81 MG tablet Take 1 tablet (81 mg total) by mouth daily. 12/22/16   Evans Lance, MD  furosemide (LASIX) 20 MG tablet TAKE 1 TABLET DAILY. 11/14/18   Evans Lance, MD  metoprolol tartrate (LOPRESSOR) 100 MG tablet TAKE (1) TABLET TWICE DAILY. 01/04/18   Evans Lance, MD  rivaroxaban (XARELTO) 20 MG TABS tablet Take 1 tablet (20 mg total) by mouth daily with supper. Patient not taking: Reported on  07/05/2018 01/20/18   Angelia Mould, MD  vancomycin [facility]) 125 MG capsule  11/11/17   [provider]    Family History Family History  Problem Relation Age of Onset  . Diabetes Father   . Colon cancer Father   . Colon cancer Mother     Social History Social History   Tobacco Use  . Smoking status: Former Smoker    Packs/day: 2.00    Years: 5.00    Pack years: 10.00    Types: Cigarettes    Start date: 11/23/2016    Quit date: 11/24/2016    Years since quitting: 2.1  . Smokeless tobacco: Never Used  Substance Use Topics  .  Alcohol use: No  . Drug use: No     Allergies   Influenza vaccines   Review of Systems Review of Systems  All other systems reviewed and are negative.    Physical Exam Updated Vital Signs There were no vitals taken for this visit.  Physical Exam Vitals signs and nursing note reviewed.  Constitutional:      General: He is not in acute distress.    Appearance: He is well-developed.  HENT:     Head: Normocephalic and atraumatic.     Mouth/Throat:     Pharynx: No oropharyngeal exudate.  Eyes:     General: No scleral icterus.       Right eye: No discharge.        Left eye: No discharge.     Conjunctiva/sclera: Conjunctivae normal.     Pupils: Pupils are equal, round, and reactive to light.  Neck:     Musculoskeletal: Normal range of motion and neck supple.     Thyroid: No thyromegaly.     Vascular: No JVD.  Cardiovascular:     Rate and Rhythm: Normal rate and regular rhythm.     Heart sounds: Normal heart sounds. No murmur. No friction rub. No gallop.   Pulmonary:     Effort: Pulmonary effort is normal. No respiratory distress.     Breath sounds: Normal breath sounds. No wheezing or rales.  Abdominal:     General: Bowel sounds are normal. There is no distension.     Palpations: Abdomen is soft. There is no mass.     Tenderness: There is no abdominal tenderness.  Musculoskeletal: Normal range of motion.        General: No tenderness.  Lymphadenopathy:     Cervical: No cervical adenopathy.  Skin:    General: Skin is warm and dry.     Findings: No erythema or rash.  Neurological:     Mental Status: He is alert.     Coordination: Coordination normal.  Psychiatric:        Behavior: Behavior normal.      ED Treatments / Results  Labs (all labs ordered are listed, but only abnormal results are displayed) Labs Reviewed  COMPREHENSIVE METABOLIC PANEL  CBC WITH DIFFERENTIAL/PLATELET  URINALYSIS, ROUTINE W REFLEX MICROSCOPIC    EKG    Radiology No results  found.  Procedures Procedures (including critical care time)  Medications Ordered in ED Medications  sodium chloride 0.9 % bolus 1,000 mL (has no administration in time range)     Initial Impression / Assessment and Plan / ED Course  I have reviewed the triage vital signs and the nursing notes.  Pertinent labs & imaging results that were available during my care of the patient were reviewed by me and considered in my medical decision  making (see chart for details).  Clinical Course as of Jan 18 1501  Fri Jan 19, 2019  1020 CBG is 738, mild creatinine elevation at 1.39 with a BUN of 23.  CO2 is normal, doubt significant acidosis, proceed with fluids and insulin.  Potassium was 4.8   [BM]  1133 Urinalysis negative for anything other than glucose.  No ketones seen.  Insulin given   [BM]    Clinical Course User Index [BM] Noemi Chapel, MD      The patient's exam is quite unremarkable.  At this time he does not appear to have any abdominal tenderness, his heart and lung exams are unremarkable without tachycardia, his mucous membranes appear normal, we will need to recheck his blood sugar to see if it is severely elevated or if this was just a lab error.  If he is hyperglycemic which I suspect he is given his history he will need to be reinitiated on medications and have his blood sugar treated in the emergency department.  IV fluids will be started, the patient is amenable to the treatment.  He does not appear to be critically ill or in DKA clinically.  The patient's labs have returned and are reassuring.  He has been given hydration, insulin x2 and has improved his blood sugar to less than 300 at the time of discharge.  I discussed the patient's care with Dr. Anastasio Champion at 3:00 PM, he agrees with the patient being started on metformin 500 mg twice a day, extended release.  The patient was informed of this, he is agreeable to the plan.  Stable for discharge at this time  Vitals:    01/19/19 1238 01/19/19 1300 01/19/19 1330 01/19/19 1400  BP: 99/75 104/75 96/73 109/81  Pulse: 83 83 88 79  Resp: 18     Temp:      TempSrc:      SpO2: 96% 95% 95% 97%  Weight:      Height:         Final Clinical Impressions(s) / ED Diagnoses   Final diagnoses:  Hyperglycemia    ED Discharge Orders         Ordered    metFORMIN (GLUCOPHAGE XR) 500 MG 24 hr tablet  2 times daily     01/19/19 1501           Noemi Chapel, MD 01/19/19 1503    Noemi Chapel, MD 01/19/19 [phone]

## 2019-01-22 DIAGNOSIS — R5383 Other fatigue: Secondary | ICD-10-CM | POA: Diagnosis not present

## 2019-01-22 DIAGNOSIS — E1165 Type 2 diabetes mellitus with hyperglycemia: Secondary | ICD-10-CM | POA: Diagnosis not present

## 2019-02-05 DIAGNOSIS — E1165 Type 2 diabetes mellitus with hyperglycemia: Secondary | ICD-10-CM | POA: Diagnosis not present

## 2019-02-05 DIAGNOSIS — I10 Essential (primary) hypertension: Secondary | ICD-10-CM | POA: Diagnosis not present

## 2019-02-12 ENCOUNTER — Other Ambulatory Visit: Payer: Self-pay | Admitting: Internal Medicine

## 2019-02-19 DIAGNOSIS — I10 Essential (primary) hypertension: Secondary | ICD-10-CM | POA: Diagnosis not present

## 2019-02-19 DIAGNOSIS — E1165 Type 2 diabetes mellitus with hyperglycemia: Secondary | ICD-10-CM | POA: Diagnosis not present

## 2019-02-19 DIAGNOSIS — I4891 Unspecified atrial fibrillation: Secondary | ICD-10-CM | POA: Diagnosis not present

## 2019-04-10 ENCOUNTER — Ambulatory Visit (INDEPENDENT_AMBULATORY_CARE_PROVIDER_SITE_OTHER): Payer: Medicare Other | Admitting: Internal Medicine

## 2019-04-10 ENCOUNTER — Encounter: Payer: Self-pay | Admitting: Internal Medicine

## 2019-04-10 ENCOUNTER — Other Ambulatory Visit: Payer: Self-pay

## 2019-04-10 VITALS — BP 138/88 | HR 66 | Temp 96.8°F | Ht 72.0 in | Wt 182.0 lb

## 2019-04-10 DIAGNOSIS — I4819 Other persistent atrial fibrillation: Secondary | ICD-10-CM | POA: Diagnosis not present

## 2019-04-10 NOTE — Progress Notes (Signed)
HPI [name] returns today for followup. He is a very pleasant 73 year old man with hypertension and chronic atrial fibrillation. In the interim he has been stable. He has retired his  work as a Photographer. He admits to dyspnea with exertion. He also has chronic leg pain which is likely neuropathy as it does not occur with exertion.Marland Kitchen He does not feel palpitations. He was in the hospital ER a few months ago with a CBG of over 800! He has been placed on metformin and glipizide. He was placed on Xarelto but the prescription was over $400/month.  Allergies  Allergen Reactions  . Influenza Vaccines Other (See Comments)    Guillain-Barre syndrome     Current Outpatient Medications  Medication Sig Dispense Refill  . aspirin EC 81 MG tablet Take 1 tablet (81 mg total) by mouth daily. 90 tablet 3  . glipiZIDE (GLUCOTROL) 5 MG tablet Take 5 mg by mouth daily before breakfast.    . metFORMIN (GLUCOPHAGE XR) 500 MG 24 hr tablet Take 1 tablet (500 mg total) by mouth 2 (two) times a day. 60 tablet 1  . metoprolol tartrate (LOPRESSOR) 50 MG tablet Take 50 mg by mouth 2 (two) times daily.     No current facility-administered medications for this visit.      Past Medical History:  Diagnosis Date  . AF (atrial fibrillation) ([place])   . Atrial flutter ([place])   . Blue toes    chronic  . GERD (gastroesophageal reflux disease)   . Gout   . Guillain Barr syndrome ([place])     ROS:   All systems reviewed and negative except as noted in the HPI.   Past Surgical History:  Procedure Laterality Date  . catheter ablation  12/23/2004  . COLONOSCOPY N/A 09/18/2014   Procedure: COLONOSCOPY;  Surgeon: Rogene Houston, MD;  [facility];  Service: Endoscopy;  Laterality: N/A;  830  . FEMUR IM NAIL Right 06/25/2013   Procedure: INTRAMEDULLARY (IM) RETROGRADE FEMORAL NAILING;  Surgeon: Marybelle Killings, MD;  Location: [facility];  Service: Orthopedics;  Laterality: Right;     Family History   Problem Relation Age of Onset  . Diabetes Father   . Colon cancer Father   . Colon cancer Mother      Social History   Socioeconomic History  . Marital status: Married    Spouse name: Not on file  . Number of children: Not on file  . Years of education: Not on file  . Highest education level: Not on file  Occupational History  . Not on file  Social Needs  . Financial resource strain: Not on file  . Food insecurity    Worry: Not on file    Inability: Not on file  . Transportation needs    Medical: Not on file    Non-medical: Not on file  Tobacco Use  . Smoking status: Former Smoker    Packs/day: 2.00    Years: 5.00    Pack years: 10.00    Types: Cigarettes    Start date: 11/23/2016    Quit date: 11/24/2016    Years since quitting: 2.3  . Smokeless tobacco: Never Used  Substance and Sexual Activity  . Alcohol use: No  . Drug use: No  . Sexual activity: Not on file  Lifestyle  . Physical activity    Days per week: Not on file    Minutes per session: Not on file  . Stress:  Not on file  Relationships  . Social Herbalist on phone: Not on file    Gets together: Not on file    Attends religious service: Not on file    Active member of club or organization: Not on file    Attends meetings of clubs or organizations: Not on file    Relationship status: Not on file  . Intimate partner violence    Fear of current or ex partner: Not on file    Emotionally abused: Not on file    Physically abused: Not on file    Forced sexual activity: Not on file  Other Topics Concern  . Not on file  Social History Narrative  . Not on file     BP 138/88   Pulse 66   Temp (!) 96.8 F (36 C) (Temporal)   Ht 6' (1.829 m)   Wt 182 lb (82.6 kg)   SpO2 98%   BMI 24.68 kg/m   Physical Exam:  Well appearing NAD HEENT: Unremarkable Neck:  No JVD, no thyromegally Lymphatics:  No adenopathy Back:  No CVA tenderness Lungs:  Clear with no wheezes HEART:  IRegular rate  rhythm, no murmurs, no rubs, no clicks Abd:  soft, positive bowel sounds, no organomegally, no rebound, no guarding Ext:  2 plus pulses, no edema, no cyanosis, no clubbing Skin:  No rashes no nodules Neuro:  CN II through XII intact, motor grossly intact  EKG - atrial fib with a controlled VR  Assess/Plan: 1. Atrial flutter - he is s/p catheter ablation remotely.  2. Atrial fib - his rates are controlled. He is minimally symptomatic. 3. HTN - his bp is up a bit. His beta blocker was reduced due to a slow HR. 4. Coags - he refuses xarelto due to cost. I discussed initiation of coumadin which I have recommended but he refuses.   Marcus [name].D.

## 2019-04-10 NOTE — Patient Instructions (Signed)
Medication Instructions: Your physician recommends that you continue on your current medications as directed. Please refer to the Current Medication list given to you today.   Labwork: None today  Procedures/Testing: None today  Follow-Up: 1 year with Dr.Taylor  Any Additional Special Instructions Will Be Listed Below (If Applicable).     If you need a refill on your cardiac medications before your next appointment, please call your pharmacy.     Thank you for choosing [facility] !

## 2019-04-13 ENCOUNTER — Other Ambulatory Visit: Payer: Self-pay

## 2019-04-13 ENCOUNTER — Other Ambulatory Visit: Payer: Self-pay | Admitting: Internal Medicine

## 2019-04-13 MED ORDER — METOPROLOL TARTRATE 50 MG PO TABS: 50.0000 mg | ORAL_TABLET | Freq: Two times a day (BID) | ORAL | 6 refills | Status: DC

## 2019-04-13 MED ORDER — METOPROLOL TARTRATE 50 MG PO TABS: 50.0000 mg | ORAL_TABLET | Freq: Two times a day (BID) | ORAL | 3 refills | Status: DC

## 2019-04-13 NOTE — Telephone Encounter (Signed)
refilled lopressor 50 mg bid, pt gets 30 day supply

## 2019-04-18 DIAGNOSIS — C4441 Basal cell carcinoma of skin of scalp and neck: Secondary | ICD-10-CM | POA: Diagnosis not present

## 2019-04-18 DIAGNOSIS — D0439 Carcinoma in situ of skin of other parts of face: Secondary | ICD-10-CM | POA: Diagnosis not present

## 2019-04-18 DIAGNOSIS — L570 Actinic keratosis: Secondary | ICD-10-CM | POA: Diagnosis not present

## 2019-04-18 DIAGNOSIS — D485 Neoplasm of uncertain behavior of skin: Secondary | ICD-10-CM | POA: Diagnosis not present

## 2019-04-23 ENCOUNTER — Encounter (INDEPENDENT_AMBULATORY_CARE_PROVIDER_SITE_OTHER): Payer: Self-pay | Admitting: Internal Medicine

## 2019-04-23 ENCOUNTER — Ambulatory Visit (INDEPENDENT_AMBULATORY_CARE_PROVIDER_SITE_OTHER): Payer: Medicare Other | Admitting: Internal Medicine

## 2019-04-23 ENCOUNTER — Other Ambulatory Visit: Payer: Self-pay

## 2019-04-23 VITALS — BP 130/80 | HR 64 | Ht 72.0 in | Wt 185.8 lb

## 2019-04-23 DIAGNOSIS — Z1159 Encounter for screening for other viral diseases: Secondary | ICD-10-CM

## 2019-04-23 DIAGNOSIS — E1165 Type 2 diabetes mellitus with hyperglycemia: Secondary | ICD-10-CM | POA: Diagnosis not present

## 2019-04-23 DIAGNOSIS — I4891 Unspecified atrial fibrillation: Secondary | ICD-10-CM | POA: Diagnosis not present

## 2019-04-23 DIAGNOSIS — I10 Essential (primary) hypertension: Secondary | ICD-10-CM | POA: Diagnosis not present

## 2019-04-23 DIAGNOSIS — IMO0002 Reserved for concepts with insufficient information to code with codable children: Secondary | ICD-10-CM

## 2019-04-23 HISTORY — DX: Reserved for concepts with insufficient information to code with codable children: IMO0002

## 2019-04-23 HISTORY — DX: Essential (primary) hypertension: I10

## 2019-04-23 HISTORY — DX: Type 2 diabetes mellitus with hyperglycemia: E11.65

## 2019-04-23 NOTE — Progress Notes (Signed)
   Wellness Office Visit  Subjective:  Patient ID: Marcus [name], male    DOB: 06-04-1946  Age: 73 y.o. MRN: [id number]  CC: This man comes in for follow-up of diabetes, hypertension, atrial fibrillation. HPI  He has seen the cardiologist and I had started him on Xarelto on the last visit but this was cost prohibitive.  When he saw the cardiologist, Coumadin was offered but the patient refused and he continues to take aspirin. He continues with glipizide and metformin for diabetes, his blood sugars seem to have improved.  He has not as yet seen an eye doctor for his diabetes. He denies any chest pain, dyspnea, palpitations or limb weakness.  There is no paresthesia. Past Medical History:  Diagnosis Date  . AF (atrial fibrillation) ([place])   . Atrial flutter ([place])   . Blue toes    chronic  . Essential hypertension, benign 04/23/2019  . GERD (gastroesophageal reflux disease)   . Gout   . Guillain Barr syndrome ([place])   . Type II diabetes mellitus, uncontrolled ([place]) 04/23/2019      Family History  Problem Relation Age of Onset  . Diabetes Father   . Colon cancer Father   . Colon cancer Mother     Social History   Social History Narrative   Married for 10 years,lives with wife.Retired.     Current Meds  Medication Sig  . aspirin EC 81 MG tablet Take 1 tablet (81 mg total) by mouth daily.  Marland Kitchen glipiZIDE (GLUCOTROL) 5 MG tablet Take 5 mg by mouth daily before breakfast.  . metFORMIN (GLUCOPHAGE XR) 500 MG 24 hr tablet Take 1 tablet (500 mg total) by mouth 2 (two) times a day.  . metoprolol tartrate (LOPRESSOR) 50 MG tablet Take 1 tablet (50 mg total) by mouth 2 (two) times daily.       Objective:   Today's Vitals: BP 130/80   Pulse 64   Ht 6' (1.829 m)   Wt 185 lb 12.8 oz (84.3 kg)   BMI 25.20 kg/m  Vitals with BMI 04/23/2019 04/10/2019 01/19/2019  Height 6' 0"  6' 0"  -  Weight 185 lbs 13 oz 182 lbs -  BMI 88.91 69.45 -  Systolic 038 882 800  Diastolic 80  88 77  Pulse 64 66 94     Physical Exam   He looks systemically well.  His blood pressure is controlled.  Ventricular rate is controlled.  He is alert and orientated without any focal neurological signs.    Assessment   1. Atrial fibrillation with RVR ([place])   2. Uncontrolled type 2 diabetes mellitus with hyperglycemia ([place])   3. Essential hypertension, benign   4. Encounter for hepatitis C screening test for low risk patient       Tests ordered Orders Placed This Encounter  Procedures  . COMPLETE METABOLIC PANEL WITH GFR  . Hemoglobin A1c  . Hepatitis C antibody     Plan: 1. He will continue with all medications for his chronic conditions above. 2. Blood work is ordered as above. 3. Further recommendations will depend on blood results and he will follow-up with Sarah in about 3 months time.     Doree Albee, MD

## 2019-04-24 LAB — HEPATITIS C ANTIBODY
Hepatitis C Ab: NONREACTIVE
SIGNAL TO CUT-OFF: 0 (ref <1.00)

## 2019-04-24 LAB — COMPLETE METABOLIC PANEL WITH GFR
AG Ratio: 1.8 (calc) (ref 1.0–2.5)
ALT: 9 U/L (ref 9–46)
AST: 11 U/L (ref 10–35)
Albumin: 4 g/dL (ref 3.6–5.1)
Alkaline phosphatase (APISO): 69 U/L (ref 35–144)
BUN/Creatinine Ratio: 19 (calc) (ref 6–22)
BUN: 24 mg/dL (ref 7–25)
CO2: 22 mmol/L (ref 20–32)
Calcium: 9.2 mg/dL (ref 8.6–10.3)
Chloride: 107 mmol/L (ref 98–110)
Creat: 1.24 mg/dL — ABNORMAL HIGH (ref 0.70–1.18)
GFR, Est African American: 65 mL/min/{1.73_m2} (ref >=60)
GFR, Est Non African American: 57 mL/min/{1.73_m2} — ABNORMAL LOW (ref >=60)
Globulin: 2.2 g/dL (calc) (ref 1.9–3.7)
Glucose, Bld: 144 mg/dL — ABNORMAL HIGH (ref 65–99)
Potassium: 4.5 mmol/L (ref 3.5–5.3)
Sodium: 142 mmol/L (ref 135–146)
Total Bilirubin: 0.3 mg/dL (ref 0.2–1.2)
Total Protein: 6.2 g/dL (ref 6.1–8.1)

## 2019-04-24 LAB — HEMOGLOBIN A1C
Hgb A1c MFr Bld: 6.8 % of total Hgb — ABNORMAL HIGH (ref <5.7)
Mean Plasma Glucose: 148 (calc)
eAG (mmol/L): 8.2 (calc)

## 2019-04-24 NOTE — Progress Notes (Signed)
Your diabetic control is much improved.  Continue with the same medications.  Make sure you drink lots of water every day.  If you have any questions, do not hesitate to contact me through my chart.  Be well!

## 2019-04-26 DIAGNOSIS — C44219 Basal cell carcinoma of skin of left ear and external auricular canal: Secondary | ICD-10-CM | POA: Diagnosis not present

## 2019-04-26 DIAGNOSIS — L988 Other specified disorders of the skin and subcutaneous tissue: Secondary | ICD-10-CM | POA: Diagnosis not present

## 2019-05-10 DIAGNOSIS — C44329 Squamous cell carcinoma of skin of other parts of face: Secondary | ICD-10-CM | POA: Diagnosis not present

## 2019-05-14 ENCOUNTER — Encounter (INDEPENDENT_AMBULATORY_CARE_PROVIDER_SITE_OTHER): Payer: Self-pay

## 2019-05-17 DIAGNOSIS — H2512 Age-related nuclear cataract, left eye: Secondary | ICD-10-CM | POA: Diagnosis not present

## 2019-05-17 DIAGNOSIS — H25042 Posterior subcapsular polar age-related cataract, left eye: Secondary | ICD-10-CM | POA: Diagnosis not present

## 2019-05-17 DIAGNOSIS — E119 Type 2 diabetes mellitus without complications: Secondary | ICD-10-CM | POA: Diagnosis not present

## 2019-05-17 DIAGNOSIS — H43812 Vitreous degeneration, left eye: Secondary | ICD-10-CM | POA: Diagnosis not present

## 2019-05-17 DIAGNOSIS — H43811 Vitreous degeneration, right eye: Secondary | ICD-10-CM | POA: Diagnosis not present

## 2019-05-17 DIAGNOSIS — H2511 Age-related nuclear cataract, right eye: Secondary | ICD-10-CM | POA: Diagnosis not present

## 2019-05-17 LAB — HM DIABETES EYE EXAM

## 2019-05-27 ENCOUNTER — Other Ambulatory Visit (INDEPENDENT_AMBULATORY_CARE_PROVIDER_SITE_OTHER): Payer: Self-pay | Admitting: Internal Medicine

## 2019-05-28 ENCOUNTER — Other Ambulatory Visit (INDEPENDENT_AMBULATORY_CARE_PROVIDER_SITE_OTHER): Payer: Self-pay | Admitting: Internal Medicine

## 2019-05-29 ENCOUNTER — Encounter: Payer: Self-pay | Admitting: Internal Medicine

## 2019-05-29 DIAGNOSIS — H2512 Age-related nuclear cataract, left eye: Secondary | ICD-10-CM | POA: Diagnosis not present

## 2019-05-29 DIAGNOSIS — H25042 Posterior subcapsular polar age-related cataract, left eye: Secondary | ICD-10-CM | POA: Diagnosis not present

## 2019-05-29 DIAGNOSIS — H25013 Cortical age-related cataract, bilateral: Secondary | ICD-10-CM | POA: Diagnosis not present

## 2019-05-29 DIAGNOSIS — H2513 Age-related nuclear cataract, bilateral: Secondary | ICD-10-CM | POA: Diagnosis not present

## 2019-05-29 DIAGNOSIS — H35033 Hypertensive retinopathy, bilateral: Secondary | ICD-10-CM | POA: Diagnosis not present

## 2019-05-29 LAB — HM DIABETES EYE EXAM

## 2019-06-04 ENCOUNTER — Ambulatory Visit (INDEPENDENT_AMBULATORY_CARE_PROVIDER_SITE_OTHER): Payer: Medicare Other | Admitting: Nurse Practitioner

## 2019-06-13 DIAGNOSIS — H52222 Regular astigmatism, left eye: Secondary | ICD-10-CM | POA: Diagnosis not present

## 2019-06-13 DIAGNOSIS — H2512 Age-related nuclear cataract, left eye: Secondary | ICD-10-CM | POA: Diagnosis not present

## 2019-06-13 DIAGNOSIS — H25812 Combined forms of age-related cataract, left eye: Secondary | ICD-10-CM | POA: Diagnosis not present

## 2019-06-18 DIAGNOSIS — H25041 Posterior subcapsular polar age-related cataract, right eye: Secondary | ICD-10-CM | POA: Diagnosis not present

## 2019-06-18 DIAGNOSIS — H25011 Cortical age-related cataract, right eye: Secondary | ICD-10-CM | POA: Diagnosis not present

## 2019-06-18 DIAGNOSIS — H2511 Age-related nuclear cataract, right eye: Secondary | ICD-10-CM | POA: Diagnosis not present

## 2019-07-04 DIAGNOSIS — H25811 Combined forms of age-related cataract, right eye: Secondary | ICD-10-CM | POA: Diagnosis not present

## 2019-07-04 DIAGNOSIS — H25011 Cortical age-related cataract, right eye: Secondary | ICD-10-CM | POA: Diagnosis not present

## 2019-07-04 DIAGNOSIS — H52221 Regular astigmatism, right eye: Secondary | ICD-10-CM | POA: Diagnosis not present

## 2019-07-04 DIAGNOSIS — H25041 Posterior subcapsular polar age-related cataract, right eye: Secondary | ICD-10-CM | POA: Diagnosis not present

## 2019-07-04 DIAGNOSIS — H2511 Age-related nuclear cataract, right eye: Secondary | ICD-10-CM | POA: Diagnosis not present

## 2019-07-19 ENCOUNTER — Other Ambulatory Visit (INDEPENDENT_AMBULATORY_CARE_PROVIDER_SITE_OTHER): Payer: Self-pay | Admitting: Internal Medicine

## 2019-07-21 ENCOUNTER — Other Ambulatory Visit (INDEPENDENT_AMBULATORY_CARE_PROVIDER_SITE_OTHER): Payer: Self-pay | Admitting: Internal Medicine

## 2019-08-01 ENCOUNTER — Other Ambulatory Visit: Payer: Self-pay

## 2019-08-01 ENCOUNTER — Encounter (INDEPENDENT_AMBULATORY_CARE_PROVIDER_SITE_OTHER): Payer: Self-pay | Admitting: Nurse Practitioner

## 2019-08-01 ENCOUNTER — Ambulatory Visit (INDEPENDENT_AMBULATORY_CARE_PROVIDER_SITE_OTHER): Payer: Medicare Other | Admitting: Nurse Practitioner

## 2019-08-01 VITALS — BP 128/70 | HR 62 | Temp 97.5°F | Resp 15 | Ht 72.0 in | Wt 191.6 lb

## 2019-08-01 DIAGNOSIS — I10 Essential (primary) hypertension: Secondary | ICD-10-CM

## 2019-08-01 DIAGNOSIS — I4891 Unspecified atrial fibrillation: Secondary | ICD-10-CM

## 2019-08-01 DIAGNOSIS — M1A9XX0 Chronic gout, unspecified, without tophus (tophi): Secondary | ICD-10-CM

## 2019-08-01 DIAGNOSIS — I739 Peripheral vascular disease, unspecified: Secondary | ICD-10-CM

## 2019-08-01 DIAGNOSIS — E1165 Type 2 diabetes mellitus with hyperglycemia: Secondary | ICD-10-CM | POA: Diagnosis not present

## 2019-08-01 NOTE — Progress Notes (Signed)
Subjective:  Patient ID: Marcus [name], male    DOB: [birth year]  Age: 74 y.o. MRN: [id number]  CC:  Chief Complaint  Patient presents with  . Atrial Fibrillation    follow up  . Diabetes      HPI  This patient presents for follow-up for chronic conditions.  Atrial fibrillation: Patient continues on his beta-blocker and has been rate controlled.  He has been evaluated by cardiology.  He continues on aspirin only for anticoagulation.  Xarelto apparently was too expensive, and he elected not to take Coumadin.  Hypertension: He continues on beta-blocker only.  Type 2 diabetes: Last A1c was collected in September 2020 and it was 6.8.  He continues on metformin and glipizide.  He tells me he has seen his eye doctor in the last year and was not told that he had any diabetic retinopathy.  He also has had cataract surgery.  He does check a fasting blood sugar daily at home.  He tells me it usually runs between 100-1 30.  He does not see podiatry.  It does not appear that he is on any ACEI/ARB or statin.  Gout: He is not currently taking any medication for this.  Tells me he has not had a flareup in a very long time.  Past Medical History:  Diagnosis Date  . AF (atrial fibrillation) ([place])   . Atrial flutter ([place])   . Blue toes    chronic  . Essential hypertension, benign 04/23/2019  . GERD (gastroesophageal reflux disease)   . Gout   . Guillain Barr syndrome ([place])   . Type II diabetes mellitus, uncontrolled ([place]) 04/23/2019      Family History  Problem Relation Age of Onset  . Diabetes Father   . Colon cancer Father   . Colon cancer Mother     Social History   Social History Narrative   Married for 27 years,lives with wife.Retired.   Social History   Tobacco Use  . Smoking status: Former Smoker    Packs/day: 2.00    Years: 5.00    Pack years: 10.00    Types: Cigarettes    Start date: 11/23/2016    Quit date: 11/24/2016    Years since quitting: 2.6  .  Smokeless tobacco: Never Used  Substance Use Topics  . Alcohol use: No     Current Meds  Medication Sig  . aspirin EC 81 MG tablet Take 1 tablet (81 mg total) by mouth daily.  [place] Kitchen glipiZIDE (GLUCOTROL) 5 MG tablet TAKE 1 TABLET ONCE DAILY.  . metFORMIN (GLUCOPHAGE-XR) 500 MG 24 hr tablet TAKE (1) TABLET TWICE DAILY.  . metoprolol tartrate (LOPRESSOR) 50 MG tablet Take 1 tablet (50 mg total) by mouth 2 (two) times daily.    ROS:  Review of Systems  Constitutional: Negative for fever and malaise/fatigue.  Eyes: Negative for blurred vision.  Respiratory: Negative for cough, shortness of breath and wheezing.   Cardiovascular: Negative for chest pain, palpitations and leg swelling.  Neurological: Positive for sensory change (reduced sensation in bilateral feet; this is chronic). Negative for dizziness, weakness and headaches.     Objective:   Today's Vitals: BP 128/70   Pulse 62   Temp (!) 97.5 F (36.4 C) (Temporal)   Resp 15   Ht 6' (1.829 m)   Wt 191 lb 9.6 oz (86.9 kg)   SpO2 94%   BMI 25.99 kg/m  Vitals with BMI 08/01/2019 04/23/2019 04/10/2019  Height 6' 0"   6' 0"  6' 0"   Weight 191 lbs 10 oz 185 lbs 13 oz 182 lbs  BMI 25.98 98.42 10.31  Systolic 281 188 677  Diastolic 70 80 88  Pulse 62 64 66     Physical Exam Vitals reviewed.  Constitutional:      Appearance: Normal appearance.  HENT:     Head: Normocephalic and atraumatic.  Cardiovascular:     Rate and Rhythm: Normal rate. Rhythm irregular.     Pulses: Decreased pulses.          Dorsalis pedis pulses are 0 on the right side and 2+ on the left side.     Comments: Bilateral feet are dusky in appearance, cool to touch, sensation is quite decreased, no pain or tenderness.  Right foot has digits 3 through 5 that are very dusky as well.  No pulse palpated in office on right foot.  Doppler not available today. Pulmonary:     Effort: Pulmonary effort is normal.     Breath sounds: Normal breath sounds.  Musculoskeletal:      Cervical back: Neck supple.     Right lower leg: No edema.     Left lower leg: No edema.  Feet:     Right foot:     Protective Sensation: 10 sites tested. 1 site sensed.    Skin integrity: No ulcer, blister or skin breakdown.     Toenail Condition: Right toenails are abnormally thick and long.     Left foot:     Protective Sensation: 10 sites tested. 0 sites sensed.     Skin integrity: No ulcer, blister or skin breakdown.     Toenail Condition: Left toenails are abnormally thick.  Skin:    General: Skin is warm and dry.  Neurological:     Mental Status: He is alert and oriented to person, place, and time.  Psychiatric:        Mood and Affect: Mood normal.        Behavior: Behavior normal.        Thought Content: Thought content normal.        Judgment: Judgment normal.          Assessment   No diagnosis found.    Tests ordered No orders of the defined types were placed in this encounter.    Plan: Please see assessment and plan per problem list below.   No orders of the defined types were placed in this encounter.   Patient to follow-up in 3 months.  Ailene Ards, NP

## 2019-08-01 NOTE — Patient Instructions (Addendum)
Thank you for choosing [facility] as your medical provider! If you have any questions or concerns regarding your health care, please do not hesitate to call our office.  Continue all medications as prescribed. I will check your blood work and urine and further recommendations will be made based on these results.   I will refer you to podiatrist and vascular surgeon, their offices should call you to get this scheduled.   Please follow-up as scheduled in 3 months. We look forward to seeing you again soon! Have a great Valentine's Day!!  At [facility] we value your feedback. You may receive a survey about your visit today. Please share your experience as we strive to create trusting relationships with our patients to provide genuine, compassionate, quality care.  We appreciate your understanding and patience as we review any laboratory studies, imaging, and other diagnostic tests that are ordered as we care for you. We do our best to address any and all results in a timely manner. If you do not hear about test results within 1 week, please do not hesitate to contact us. If we referred you to a specialist during your visit or ordered imaging testing, contact the office if you have not been contacted to be scheduled within 1 weeks.  We also encourage the use of MyChart, which contains your medical information for your review as well. If you are not enrolled in this feature, an access code is on this after visit summary for your convenience. Thank you for allowing [place] to be involved in your care.

## 2019-08-01 NOTE — Assessment & Plan Note (Signed)
Appears to be well controlled at this time.  Will continue to monitor him for any flareups.

## 2019-08-01 NOTE — Assessment & Plan Note (Signed)
Fasting blood sugars seem to be at goal at home.  We will collect A1c today.  I also want to test his urine for albuminuria.  We will also collect lipid panel.  I did discuss possibly adding ACEI/ARB to patient's treatment regimen, but he would like to wait to see what his urine shows first.  We will also need to discuss possibly adding statin therapy as well.  Patient will continue on current medications as prescribed, further recommendations may be made based upon blood work.  I will also refer him to podiatry for further evaluation and management of his thickened and long toenails in the setting of diabetes.

## 2019-08-01 NOTE — Assessment & Plan Note (Signed)
On exam appears patient probably has peripheral vascular disease.  His right foot, especially digits 3-5 are quite dusky.  No pulse was palpated on exam today.  I did consult with Dr. Anastasio Champion and he examined the foot as well.  Patient does tell me that his foot has appeared this way for approximately 6 years.  I am going to refer him to vascular surgeon for further evaluation and management of probable vascular disease.

## 2019-08-01 NOTE — Assessment & Plan Note (Signed)
Blood pressure well controlled on current regimen.  He will continue on medication as prescribed.

## 2019-08-01 NOTE — Assessment & Plan Note (Addendum)
Rate is well controlled.  Rhythm on exam was irregularly irregular.  He continues to want to take only aspirin for anticoagulation.  I encouraged him to let me know if he were to change his mind.  He will continue on his beta-blocker as prescribed.

## 2019-08-07 DIAGNOSIS — I10 Essential (primary) hypertension: Secondary | ICD-10-CM | POA: Diagnosis not present

## 2019-08-07 DIAGNOSIS — E1165 Type 2 diabetes mellitus with hyperglycemia: Secondary | ICD-10-CM | POA: Diagnosis not present

## 2019-08-08 ENCOUNTER — Other Ambulatory Visit: Payer: Self-pay

## 2019-08-08 DIAGNOSIS — I739 Peripheral vascular disease, unspecified: Secondary | ICD-10-CM

## 2019-08-08 LAB — COMPLETE METABOLIC PANEL WITH GFR
AG Ratio: 2 (calc) (ref 1.0–2.5)
ALT: 12 U/L (ref 9–46)
AST: 13 U/L (ref 10–35)
Albumin: 4.5 g/dL (ref 3.6–5.1)
Alkaline phosphatase (APISO): 67 U/L (ref 35–144)
BUN: 23 mg/dL (ref 7–25)
CO2: 28 mmol/L (ref 20–32)
Calcium: 9.7 mg/dL (ref 8.6–10.3)
Chloride: 105 mmol/L (ref 98–110)
Creat: 1.16 mg/dL (ref 0.70–1.18)
GFR, Est African American: 71 mL/min/{1.73_m2} (ref >=60)
GFR, Est Non African American: 61 mL/min/{1.73_m2} (ref >=60)
Globulin: 2.3 g/dL (calc) (ref 1.9–3.7)
Glucose, Bld: 125 mg/dL — ABNORMAL HIGH (ref 65–99)
Potassium: 5.3 mmol/L (ref 3.5–5.3)
Sodium: 142 mmol/L (ref 135–146)
Total Bilirubin: 0.7 mg/dL (ref 0.2–1.2)
Total Protein: 6.8 g/dL (ref 6.1–8.1)

## 2019-08-08 LAB — HEMOGLOBIN A1C
Hgb A1c MFr Bld: 6.5 % of total Hgb — ABNORMAL HIGH (ref <5.7)
Mean Plasma Glucose: 140 (calc)
eAG (mmol/L): 7.7 (calc)

## 2019-08-08 LAB — LIPID PANEL
Cholesterol: 168 mg/dL (ref <200)
HDL: 42 mg/dL (ref >=40)
LDL Cholesterol (Calc): 101 mg/dL (calc) — ABNORMAL HIGH
Non-HDL Cholesterol (Calc): 126 mg/dL (calc) (ref <130)
Total CHOL/HDL Ratio: 4 (calc) (ref <5.0)
Triglycerides: 146 mg/dL (ref <150)

## 2019-08-08 LAB — MICROALBUMIN / CREATININE URINE RATIO
Creatinine, Urine: 128 mg/dL (ref 20–320)
Microalb Creat Ratio: 16 mcg/mg creat (ref <30)
Microalb, Ur: 2.1 mg/dL

## 2019-08-09 ENCOUNTER — Ambulatory Visit (HOSPITAL_COMMUNITY)
Admission: RE | Admit: 2019-08-09 | Discharge: 2019-08-09 | Disposition: A | Discharge status: Home / Self Care | Payer: Medicare Other | Source: Ambulatory Visit | Attending: Vascular Surgery | Admitting: Vascular Surgery

## 2019-08-09 ENCOUNTER — Other Ambulatory Visit: Payer: Self-pay

## 2019-08-09 ENCOUNTER — Encounter: Payer: Self-pay | Admitting: Vascular Surgery

## 2019-08-09 ENCOUNTER — Ambulatory Visit (INDEPENDENT_AMBULATORY_CARE_PROVIDER_SITE_OTHER): Payer: Medicare Other | Admitting: Vascular Surgery

## 2019-08-09 VITALS — BP 158/93 | HR 76 | Temp 97.8°F | Resp 18 | Ht 72.0 in | Wt 189.7 lb

## 2019-08-09 DIAGNOSIS — I739 Peripheral vascular disease, unspecified: Secondary | ICD-10-CM

## 2019-08-09 DIAGNOSIS — I7389 Other specified peripheral vascular diseases: Secondary | ICD-10-CM | POA: Diagnosis not present

## 2019-08-09 NOTE — Progress Notes (Signed)
REASON FOR CONSULT:    To evaluate for peripheral vascular disease.  The consult is requested by Jeralyn Ruths, NP  ASSESSMENT & PLAN:   PERIPHERAL VASCULAR DISEASE: The patient does have some evidence of underlying infrainguinal arterial occlusive disease but this is not significant.  I think the discoloration in his feet is related to acrocyanosis.  On exam he does not have evidence of significant venous disease.  I encouraged him to stay as active as possible.  Fortunately he is not a smoker.  I encouraged him to keep his feet warm especially in the winter and elevate his legs 15 to 20 minutes every day.  I will be happy to see him back at anytime in the future if any new vascular issues arise.   Deitra Mayo, MD Office: [phone]   HPI:   Marcus [name] is a pleasant 74 y.o. male, who was referred for evaluation for peripheral vascular disease.  He has bluish discoloration of his feet which prompted the referral.  On my history the patient denies any history of claudication, rest pain, or nonhealing ulcers.  He wears compression stockings some and has been wearing them for years.  He is relatively active for his age.  His risk factors for peripheral vascular disease include type 2 diabetes, hypertension, and a remote history of tobacco use.  He quit when he was a teenager.  He denies any history of hypercholesterolemia or family history of premature cardiovascular disease.  He does have a history of atrial fibrillation and is followed by Dr. Crissie Sickles.  He is on aspirin.  Past Medical History:  Diagnosis Date  . AF (atrial fibrillation) ([place])   . Atrial flutter ([place])   . Blue toes    chronic  . Essential hypertension, benign 04/23/2019  . GERD (gastroesophageal reflux disease)   . Gout   . Guillain Barr syndrome ([place])   . Type II diabetes mellitus, uncontrolled ([place]) 04/23/2019    Family History  Problem Relation Age of Onset  . Diabetes Father   . Colon  cancer Father   . Colon cancer Mother     SOCIAL HISTORY: Social History   Socioeconomic History  . Marital status: Married    Spouse name: Not on file  . Number of children: Not on file  . Years of education: Not on file  . Highest education level: Not on file  Occupational History  . Not on file  Tobacco Use  . Smoking status: Former Smoker    Packs/day: 2.00    Years: 5.00    Pack years: 10.00    Types: Cigarettes    Start date: 11/23/2016    Quit date: 11/24/2016    Years since quitting: 2.7  . Smokeless tobacco: Never Used  Substance and Sexual Activity  . Alcohol use: No  . Drug use: No  . Sexual activity: Not on file  Other Topics Concern  . Not on file  Social History Narrative   Married for 75 years,lives with wife.Retired.   Social Determinants of Health   Financial Resource Strain:   . Difficulty of Paying Living Expenses: Not on file  Food Insecurity:   . Worried About Charity fundraiser in the Last Year: Not on file  . Ran Out of Food in the Last Year: Not on file  Transportation Needs:   . Lack of Transportation (Medical): Not on file  . Lack of Transportation (Non-Medical): Not on file  Physical Activity:   .  Days of Exercise per Week: Not on file  . Minutes of Exercise per Session: Not on file  Stress:   . Feeling of Stress : Not on file  Social Connections:   . Frequency of Communication with Friends and Family: Not on file  . Frequency of Social Gatherings with Friends and Family: Not on file  . Attends Religious Services: Not on file  . Active Member of Clubs or Organizations: Not on file  . Attends Archivist Meetings: Not on file  . Marital Status: Not on file  Intimate Partner Violence:   . Fear of Current or Ex-Partner: Not on file  . Emotionally Abused: Not on file  . Physically Abused: Not on file  . Sexually Abused: Not on file    Allergies  Allergen Reactions  . Influenza Vaccines Other (See Comments)     Guillain-Barre syndrome    Current Outpatient Medications  Medication Sig Dispense Refill  . aspirin EC 81 MG tablet Take 1 tablet (81 mg total) by mouth daily. 90 tablet 3  . glipiZIDE (GLUCOTROL) 5 MG tablet TAKE 1 TABLET ONCE DAILY. 30 tablet 3  . metFORMIN (GLUCOPHAGE-XR) 500 MG 24 hr tablet TAKE (1) TABLET TWICE DAILY. 60 tablet 1  . metoprolol tartrate (LOPRESSOR) 50 MG tablet Take 1 tablet (50 mg total) by mouth 2 (two) times daily. 180 tablet 3   No current facility-administered medications for this visit.    REVIEW OF SYSTEMS:  [X]  denotes positive finding, [ ]  denotes negative finding Cardiac  Comments:  Chest pain or chest pressure:    Shortness of breath upon exertion:    Short of breath when lying flat:    Irregular heart rhythm:        Vascular    Pain in calf, thigh, or hip brought on by ambulation:    Pain in feet at night that wakes you up from your sleep:     Blood clot in your veins:    Leg swelling:         Pulmonary    Oxygen at home:    Productive cough:     Wheezing:         Neurologic    Sudden weakness in arms or legs:     Sudden numbness in arms or legs:     Sudden onset of difficulty speaking or slurred speech:    Temporary loss of vision in one eye:     Problems with dizziness:         Gastrointestinal    Blood in stool:     Vomited blood:         Genitourinary    Burning when urinating:     Blood in urine:        Psychiatric    Major depression:         Hematologic    Bleeding problems:    Problems with blood clotting too easily:        Skin    Rashes or ulcers:        Constitutional    Fever or chills:     PHYSICAL EXAM:   Vitals:   08/09/19 1029  BP: (!) 158/93  Pulse: 76  Resp: 18  Temp: 97.8 F (36.6 C)  TempSrc: Temporal  SpO2: 100%  Weight: 189 lb 11.2 oz (86 kg)  Height: 6' (1.829 m)    GENERAL: The patient is a well-nourished male, in no acute distress. The vital signs are documented above.  CARDIAC: There  is a regular rate and rhythm.  VASCULAR: I do not detect carotid bruits. He has palpable radial pulses. On the right side he has a palpable femoral pulse and posterior tibial pulse.  I cannot palpate a dorsalis pedis pulse. On the left side he has a femoral pulse and dorsalis pedis pulse.  I cannot palpate a posterior tibial pulse. He has acrocyanosis of both feet. LEGS DEPENDENT   LEGS ELEVATED    Although it is difficult to determine from the picture the color of his feet does improve significantly with elevation of his feet. PULMONARY: There is good air exchange bilaterally without wheezing or rales. ABDOMEN: Soft and non-tender with normal pitched bowel sounds.  MUSCULOSKELETAL: There are no major deformities or cyanosis. NEUROLOGIC: No focal weakness or paresthesias are detected. SKIN: There are no ulcers or rashes noted. PSYCHIATRIC: The patient has a normal affect.  DATA:    ARTERIAL DOPPLER STUDY: I have independently interpreted his arterial Doppler study today.  On the right side he is a monophasic dorsalis pedis signal with a triphasic posterior tibial signal.  ABI is 100%.  Toe pressures 138 mmHg.  On the left side he has a triphasic dorsalis pedis signal with a biphasic posterior tibial signal.  ABI is 100%.  Toe pressures 159 mmHg.  LABS: I reviewed his labs from 08/07/2019.  Creatinine 1.16.  GFR is 61.  Cholesterol 168 LDL cholesterol is 101.

## 2019-08-13 ENCOUNTER — Encounter (INDEPENDENT_AMBULATORY_CARE_PROVIDER_SITE_OTHER): Payer: Self-pay

## 2019-08-18 ENCOUNTER — Other Ambulatory Visit (INDEPENDENT_AMBULATORY_CARE_PROVIDER_SITE_OTHER): Payer: Self-pay | Admitting: Internal Medicine

## 2019-08-21 ENCOUNTER — Ambulatory Visit (INDEPENDENT_AMBULATORY_CARE_PROVIDER_SITE_OTHER): Payer: Medicare Other | Admitting: Internal Medicine

## 2019-08-21 ENCOUNTER — Other Ambulatory Visit: Payer: Self-pay

## 2019-08-21 ENCOUNTER — Encounter (INDEPENDENT_AMBULATORY_CARE_PROVIDER_SITE_OTHER): Payer: Self-pay | Admitting: Internal Medicine

## 2019-08-21 DIAGNOSIS — Z86010 Personal history of colonic polyps: Secondary | ICD-10-CM | POA: Insufficient documentation

## 2019-08-21 NOTE — Progress Notes (Signed)
Virtual Visit via Telephone Note  Patient had scheduled face-to-face visit.  Visit was changed to virtual/telephone visit because of ongoing Covid-19 pandemic and we both agreed. I connected with Marcus [name] on 08/21/19 at 12:48 PM EST by telephone and verified that I am speaking with the correct person using two identifiers.  Location: Patient:  In car; pulled off the road to attend call. Provider: office   I discussed the limitations, risks, security and privacy concerns of performing an evaluation and management service by telephone and the availability of in person appointments. I also discussed with the patient that there may be a patient responsible charge related to this service. The patient expressed understanding and agreed to proceed.   History of Present Illness:  Patient is 74 year old Caucasian male who has history of C. difficile colitis as well as GERD who is undergoing yearly visit.  He was last seen in November 2019. His last colonoscopy was in February 2016 and he had 2 small polyps removed.  One was a tubular adenoma and the other polyp was sessile serrated polyp. Family history is positive for colon carcinoma in his mother who was 18 at the time of diagnosis and died 46 years later.  His father was diagnosed with colon carcinoma at 74 and died soon thereafter..  Family history is also significant for lung carcinoma in his brother who died within few months at age 14. He states he is doing well.  His bowels move daily.  He has good appetite and denies weight loss.  He denies melena or rectal bleeding.  He is watching his diet.  He is not having any heartburn dysphagia.  He is not taking any medications for GERD.   Observations/Objective:  Current weight not available.  Assessment and Plan:  History of GERD.  Patient presently is not taking any medications and not having any symptoms.  History of colonic polyps.  Last colonoscopy was in February 2016.  He  will be due for colonoscopy next month.  Colonoscopy would be scheduled as soon as Covid-19 pandemic contains.  Follow Up Instructions:  Colonoscopy to be scheduled later this year when Covid-19 pandemic is contained. Office visit on as-needed basis.  I discussed the assessment and treatment plan with the patient. The patient was provided an opportunity to ask questions and all were answered. The patient agreed with the plan and demonstrated an understanding of the instructions.   The patient was advised to call back or seek an in-person evaluation if the symptoms worsen or if the condition fails to improve as anticipated.  I provided 5 minutes of non-face-to-face time during this encounter.   Hildred Laser, MD

## 2019-08-21 NOTE — Patient Instructions (Signed)
Colonoscopy to be scheduled in July 2021.

## 2019-08-24 ENCOUNTER — Other Ambulatory Visit (INDEPENDENT_AMBULATORY_CARE_PROVIDER_SITE_OTHER): Payer: Self-pay | Admitting: Internal Medicine

## 2019-09-18 ENCOUNTER — Other Ambulatory Visit (INDEPENDENT_AMBULATORY_CARE_PROVIDER_SITE_OTHER): Payer: Self-pay | Admitting: Internal Medicine

## 2019-09-26 ENCOUNTER — Encounter: Payer: Self-pay | Admitting: Podiatry

## 2019-09-26 ENCOUNTER — Other Ambulatory Visit: Payer: Self-pay

## 2019-09-26 ENCOUNTER — Ambulatory Visit (INDEPENDENT_AMBULATORY_CARE_PROVIDER_SITE_OTHER): Payer: Medicare Other | Admitting: Podiatry

## 2019-09-26 VITALS — BP 135/92 | HR 79 | Temp 96.3°F

## 2019-09-26 DIAGNOSIS — B351 Tinea unguium: Secondary | ICD-10-CM | POA: Diagnosis not present

## 2019-09-26 DIAGNOSIS — E119 Type 2 diabetes mellitus without complications: Secondary | ICD-10-CM

## 2019-09-26 DIAGNOSIS — M2011 Hallux valgus (acquired), right foot: Secondary | ICD-10-CM | POA: Diagnosis not present

## 2019-09-26 DIAGNOSIS — M79674 Pain in right toe(s): Secondary | ICD-10-CM | POA: Diagnosis not present

## 2019-09-26 DIAGNOSIS — M79675 Pain in left toe(s): Secondary | ICD-10-CM | POA: Diagnosis not present

## 2019-09-26 DIAGNOSIS — R600 Localized edema: Secondary | ICD-10-CM | POA: Diagnosis not present

## 2019-09-26 DIAGNOSIS — M2012 Hallux valgus (acquired), left foot: Secondary | ICD-10-CM

## 2019-09-26 DIAGNOSIS — E1151 Type 2 diabetes mellitus with diabetic peripheral angiopathy without gangrene: Secondary | ICD-10-CM | POA: Diagnosis not present

## 2019-09-26 NOTE — Patient Instructions (Addendum)
Corns and Calluses Corns are small areas of thickened skin that occur on the top, sides, or tip of a toe. They contain a cone-shaped core with a point that can press on a nerve below. This causes pain.  Calluses are areas of thickened skin that can occur anywhere on the body, including the hands, fingers, palms, soles of the feet, and heels. Calluses are usually larger than corns. What are the causes? Corns and calluses are caused by rubbing (friction) or pressure, such as from shoes that are too tight or do not fit properly. What increases the risk? Corns are more likely to develop in people who have misshapen toes (toe deformities), such as hammer toes. Calluses can occur with friction to any area of the skin. They are more likely to develop in people who:  Work with their hands.  Wear shoes that fit poorly, are too tight, or are high-heeled.  Have toe deformities. What are the signs or symptoms? Symptoms of a corn or callus include:  A hard growth on the skin.  Pain or tenderness under the skin.  Redness and swelling.  Increased discomfort while wearing tight-fitting shoes, if your feet are affected. If a corn or callus becomes infected, symptoms may include:  Redness and swelling that gets worse.  Pain.  Fluid, blood, or pus draining from the corn or callus. How is this diagnosed? Corns and calluses may be diagnosed based on your symptoms, your medical history, and a physical exam. How is this treated? Treatment for corns and calluses may include:  Removing the cause of the friction or pressure. This may involve: ? Changing your shoes. ? Wearing shoe inserts (orthotics) or other protective layers in your shoes, such as a corn pad. ? Wearing gloves.  Applying medicine to the skin (topical medicine) to help soften skin in the hardened, thickened areas.  Removing layers of dead skin with a file to reduce the size of the corn or callus.  Removing the corn or callus with a  scalpel or laser.  Taking antibiotic medicines, if your corn or callus is infected.  Having surgery, if a toe deformity is the cause. Follow these instructions at home:   Take over-the-counter and prescription medicines only as told by your health care provider.  If you were prescribed an antibiotic, take it as told by your health care provider. Do not stop taking it even if your condition starts to improve.  Wear shoes that fit well. Avoid wearing high-heeled shoes and shoes that are too tight or too loose.  Wear any padding, protective layers, gloves, or orthotics as told by your health care provider.  Soak your hands or feet and then use a file or pumice stone to soften your corn or callus. Do this as told by your health care provider.  Check your corn or callus every day for symptoms of infection. Contact a health care provider if you:  Notice that your symptoms do not improve with treatment.  Have redness or swelling that gets worse.  Notice that your corn or callus becomes painful.  Have fluid, blood, or pus coming from your corn or callus.  Have new symptoms. Summary  Corns are small areas of thickened skin that occur on the top, sides, or tip of a toe.  Calluses are areas of thickened skin that can occur anywhere on the body, including the hands, fingers, palms, and soles of the feet. Calluses are usually larger than corns.  Corns and calluses are caused by  rubbing (friction) or pressure, such as from shoes that are too tight or do not fit properly.  Treatment may include wearing any padding, protective layers, gloves, or orthotics as told by your health care provider. This information is not intended to replace advice given to you by your health care provider. Make sure you discuss any questions you have with your health care provider. Document Revised: 11/01/2018 Document Reviewed: 05/25/2017 Elsevier Patient Education  [place].  Peripheral Vascular  Disease Peripheral vascular disease (PVD) is a disease of the blood vessels. A simple term for PVD is poor circulation. In most cases, PVD narrows the blood vessels that carry blood from your heart to the rest of your body. This can result in a decreased supply of blood to your arms, legs, and internal organs, like your stomach or kidneys. However, it most often affects a person's lower legs and feet. There are two types of PVD.  Organic PVD. This is the more common type. It is caused by damage to the structure of blood vessels.  Functional PVD. This is caused by conditions that make blood vessels contract and tighten (spasm). Without treatment, PVD tends to get worse over time. PVD can also lead to acute limb ischemia. This is when an arm or leg suddenly has trouble getting enough blood. This is a medical emergency. What are the causes?  Each type of PVD has many different causes. The most common cause of PVD is buildup of a fatty material (plaque) inside your arteries (atherosclerosis). Small amounts of plaque can break off from the walls of the blood vessels and become lodged in a smaller artery. This blocks blood flow and can cause acute limb ischemia. Other common causes of PVD include:  Blood clots that form inside of blood vessels.  Injuries to blood vessels.  Diseases that cause inflammation of blood vessels or cause blood vessel spasms.  Health behaviors and health history that increase your risk of developing PVD. What increases the risk? You are more likely to develop this condition if:  You have a family history of PVD.  You have certain medical conditions, including: ? High cholesterol. ? Diabetes. ? High blood pressure (hypertension). ? Coronary heart disease. ? Past problems with blood clots. ? Past injury, such as burns or a broken bone. These may have damaged blood vessels in your limbs. ? Buerger disease. This is caused by inflamed blood vessels in your hands and  feet. ? Some forms of arthritis. ? Rare birth defects that affect the arteries in your legs. ? Kidney disease.  You use tobacco or smoke.  You do not get enough exercise.  You are obese.  You are age 88 or older. What are the signs or symptoms? This condition may cause different symptoms. Your symptoms depend on what part of your body is not getting enough blood. Some common signs and symptoms include:  Cramps in your lower legs. This may be a symptom of poor leg circulation (claudication).  Pain and weakness in your legs. This happens while you are physically active but goes away when you rest (intermittent claudication).  Leg pain when at rest.  Leg numbness, tingling, or weakness.  Coldness in a leg or foot, especially when compared with the other leg.  Skin or hair changes. These can include: ? Hair loss. ? Shiny skin. ? Pale or bluish skin. ? Thick toenails.  Inability to get or maintain an erection (erectile dysfunction).  Fatigue. People with PVD are more likely  to develop ulcers and sores on their toes, feet, or legs. These may take longer than normal to heal. How is this diagnosed? This condition is diagnosed based on:  Your signs and symptoms.  A physical exam and your medical history.  Other tests to find out what is causing your PVD and to determine its severity. Tests may include: ? Blood pressure recordings from your arms and legs and measurements of the strength of your pulses (pulse volume recordings). ? Imaging studies using sound waves to take pictures of the blood flow through your blood vessels (Doppler ultrasound). ? Injecting a dye into your blood vessels before having imaging studies using:  X-rays (angiogram or arteriogram).  Computer-generated X-rays (CT angiogram).  A powerful electromagnetic field and a computer (magnetic resonance angiogram or MRA). How is this treated? Treatment for PVD depends on the cause of your condition and how  severe your symptoms are. It also depends on your age. Underlying causes need to be treated and controlled. These include long-term (chronic) conditions, such as diabetes, high cholesterol, and high blood pressure. Treatment includes:  Lifestyle changes, such as: ? Quitting smoking. ? Exercising regularly. ? Following a low-fat, low-cholesterol diet.  Taking medicines, such as: ? Blood thinners to prevent blood clots. ? Medicines to improve blood flow. ? Medicines to improve your blood cholesterol levels.  Surgical procedures, such as: ? A procedure that uses an inflated balloon to open a blocked artery and improve blood flow (angioplasty). ? A procedure to put in a wire mesh tube to keep a blocked artery open (stent implant). ? Surgery to reroute blood flow around a blocked artery (peripheral bypass surgery). ? Surgery to remove dead tissue from an infected wound on the affected limb. ? Amputation. This is surgical removal of the affected limb. It may be necessary in cases of acute limb ischemia where there has been no improvement through medical or surgical treatments. Follow these instructions at home: Lifestyle  Do not use any products that contain nicotine or tobacco, such as cigarettes and e-cigarettes. If you need help quitting, ask your health care provider.  Lose weight if you are overweight, and maintain a healthy weight as discussed by your health care provider.  Eat a diet that is low in fat and cholesterol. If you need help, ask your health care provider.  Exercise regularly. Ask your health care provider to suggest some good activities for you. General instructions  Take over-the-counter and prescription medicines only as told by your health care provider.  Take good care of your feet: ? Wear comfortable shoes that fit well. ? Check your feet often for any cuts or sores.  Keep all follow-up visits as told by your health care provider. This is important. Contact a  health care provider if:  You have cramps in your legs while walking.  You have leg pain when you are at rest.  You have coldness in a leg or foot.  Your skin changes.  You have erectile dysfunction.  You have cuts or sores on your feet that are not healing. Get help right away if:  Your arm or leg turns cold, numb, and blue.  Your arms or legs become red, warm, swollen, painful, or numb.  You have chest pain or trouble breathing.  You suddenly have weakness in your face, arm, or leg.  You become very confused or lose the ability to speak.  You suddenly have a very bad headache or lose your vision. Summary  Peripheral vascular  disease (PVD) is a disease of the blood vessels.  In most cases, PVD narrows the blood vessels that carry blood from your heart to the rest of your body.  PVD may cause different symptoms. Your symptoms depend on what part of your body is not getting enough blood.  Treatment for PVD depends on the cause of your condition and how severe your symptoms are. This information is not intended to replace advice given to you by your health care provider. Make sure you discuss any questions you have with your health care provider. Document Revised: 06/24/2017 Document Reviewed: 08/19/2016 Elsevier Patient Education  [place].  Diabetes Mellitus and Donna care is an important part of your health, especially when you have diabetes. Diabetes may cause you to have problems because of poor blood flow (circulation) to your feet and legs, which can cause your skin to:  Become thinner and drier.  Break more easily.  Heal more slowly.  Peel and crack. You may also have nerve damage (neuropathy) in your legs and feet, causing decreased feeling in them. This means that you may not notice minor injuries to your feet that could lead to more serious problems. Noticing and addressing any potential problems early is the best way to prevent future foot  problems. How to care for your feet Foot hygiene  Wash your feet daily with warm water and mild soap. Do not use hot water. Then, pat your feet and the areas between your toes until they are completely dry. Do not soak your feet as this can dry your skin.  Trim your toenails straight across. Do not dig under them or around the cuticle. File the edges of your nails with an emery board or nail file.  Apply a moisturizing lotion or petroleum jelly to the skin on your feet and to dry, brittle toenails. Use lotion that does not contain alcohol and is unscented. Do not apply lotion between your toes. Shoes and socks  Wear clean socks or stockings every day. Make sure they are not too tight. Do not wear knee-high stockings since they may decrease blood flow to your legs.  Wear shoes that fit properly and have enough cushioning. Always look in your shoes before you put them on to be sure there are no objects inside.  To break in new shoes, wear them for just a few hours a day. This prevents injuries on your feet. Wounds, scrapes, corns, and calluses  Check your feet daily for blisters, cuts, bruises, sores, and redness. If you cannot see the bottom of your feet, use a mirror or ask someone for help.  Do not cut corns or calluses or try to remove them with medicine.  If you find a minor scrape, cut, or break in the skin on your feet, keep it and the skin around it clean and dry. You may clean these areas with mild soap and water. Do not clean the area with peroxide, alcohol, or iodine.  If you have a wound, scrape, corn, or callus on your foot, look at it several times a day to make sure it is healing and not infected. Check for: ? Redness, swelling, or pain. ? Fluid or blood. ? Warmth. ? Pus or a bad smell. General instructions  Do not cross your legs. This may decrease blood flow to your feet.  Do not use heating pads or hot water bottles on your feet. They may burn your skin. If you have  lost feeling  in your feet or legs, you may not know this is happening until it is too late.  Protect your feet from hot and cold by wearing shoes, such as at the beach or on hot pavement.  Schedule a complete foot exam at least once a year (annually) or more often if you have foot problems. If you have foot problems, report any cuts, sores, or bruises to your health care provider immediately. Contact a health care provider if:  You have a medical condition that increases your risk of infection and you have any cuts, sores, or bruises on your feet.  You have an injury that is not healing.  You have redness on your legs or feet.  You feel burning or tingling in your legs or feet.  You have pain or cramps in your legs and feet.  Your legs or feet are numb.  Your feet always feel cold.  You have pain around a toenail. Get help right away if:  You have a wound, scrape, corn, or callus on your foot and: ? You have pain, swelling, or redness that gets worse. ? You have fluid or blood coming from the wound, scrape, corn, or callus. ? Your wound, scrape, corn, or callus feels warm to the touch. ? You have pus or a bad smell coming from the wound, scrape, corn, or callus. ? You have a fever. ? You have a red line going up your leg. Summary  Check your feet every day for cuts, sores, red spots, swelling, and blisters.  Moisturize feet and legs daily.  Wear shoes that fit properly and have enough cushioning.  If you have foot problems, report any cuts, sores, or bruises to your health care provider immediately.  Schedule a complete foot exam at least once a year (annually) or more often if you have foot problems. This information is not intended to replace advice given to you by your health care provider. Make sure you discuss any questions you have with your health care provider. Document Revised: 04/04/2019 Document Reviewed: 08/13/2016 Elsevier Patient Education  [place].

## 2019-10-01 NOTE — Progress Notes (Signed)
Subjective: Marcus [name] presents today referred by Doree Albee, MD for diabetic foot evaluation.  Patient relates 2-3 year history of diabetes.  Patient denies any history of foot wounds.  Patient admits numbness to feet; also has h/o Guillain Barre' Syndrome.  Today, patient c/o of painful, discolored, thick toenails which interfere with daily activities.  Pain is aggravated when wearing enclosed shoe gear.   Past Medical History:  Diagnosis Date  . AF (atrial fibrillation) ([place])   . Atrial flutter ([place])   . Blue toes    chronic  . Essential hypertension, benign 04/23/2019  . GERD (gastroesophageal reflux disease)   . Gout   . Guillain Barr syndrome ([place])   . Type II diabetes mellitus, uncontrolled ([place]) 04/23/2019    Patient Active Problem List   Diagnosis Date Noted  . History of colonic polyps 08/21/2019  . Peripheral vascular disease ([place]) 08/01/2019  . Type II diabetes mellitus, uncontrolled ([place]) 04/23/2019  . Essential hypertension, benign 04/23/2019  . C. difficile colitis 11/09/2017  . Focal neurological deficit 07/21/2013  . GBS (Guillain Barre syndrome) ([place]) 07/21/2013  . Leg weakness 07/21/2013  . Femur fracture ([place]) 06/25/2013  . Femur fracture, right ([place]) 06/25/2013  . GOUT 07/31/2009  . Atrial fibrillation with RVR ([place]) 07/31/2009  . Atrial flutter ([place]) 07/31/2009    Past Surgical History:  Procedure Laterality Date  . catheter ablation  12/23/2004  . COLONOSCOPY N/A 09/18/2014   Procedure: COLONOSCOPY;  Surgeon: Rogene Houston, MD;  Location: [facility];  Service: Endoscopy;  Laterality: N/A;  830  . EYE SURGERY  2020   Bilateral cataract surgery  . FEMUR IM NAIL Right 06/25/2013   Procedure: INTRAMEDULLARY (IM) RETROGRADE FEMORAL NAILING;  Surgeon: Marybelle Killings, MD;  Location: [facility];  Service: Orthopedics;  Laterality: Right;    Current Outpatient Medications on File Prior to Visit  Medication Sig Dispense Refill  .  aspirin EC 81 MG tablet Take 1 tablet (81 mg total) by mouth daily. 90 tablet 3  . metFORMIN (GLUCOPHAGE-XR) 500 MG 24 hr tablet Take 1 tablet (500 mg total) by mouth 2 (two) times daily. 60 tablet 2  . glipiZIDE (GLUCOTROL) 5 MG tablet TAKE 1 TABLET BY MOUTH ONCE DAILY. 30 tablet 3  . metoprolol tartrate (LOPRESSOR) 50 MG tablet Take 1 tablet (50 mg total) by mouth 2 (two) times daily. 180 tablet 3  . [DISCONTINUED] glipiZIDE (GLUCOTROL) 5 MG tablet Take 5 mg by mouth daily before breakfast.    . [DISCONTINUED] glipiZIDE (GLUCOTROL) 5 MG tablet TAKE 1 TABLET ONCE DAILY. 30 tablet 3  . [DISCONTINUED] metFORMIN (GLUCOPHAGE XR) 500 MG 24 hr tablet Take 1 tablet (500 mg total) by mouth 2 (two) times a day. 60 tablet 1  . [DISCONTINUED] metFORMIN (GLUCOPHAGE-XR) 500 MG 24 hr tablet TAKE (1) TABLET TWICE DAILY. 60 tablet 1   No current facility-administered medications on file prior to visit.     Allergies  Allergen Reactions  . Influenza Vaccines Other (See Comments)    Guillain-Barre syndrome    Social History   Occupational History  . Not on file  Tobacco Use  . Smoking status: Former Smoker    Packs/day: 2.00    Years: 5.00    Pack years: 10.00    Types: Cigarettes    Start date: 11/23/2016    Quit date: 11/24/2016    Years since quitting: 2.8  . Smokeless tobacco: Never Used  Substance and Sexual Activity  . Alcohol  use: No  . Drug use: No  . Sexual activity: Not on file    Family History  Problem Relation Age of Onset  . Diabetes Father   . Colon cancer Father   . Colon cancer Mother     Immunization History  Administered Date(s) Administered  . Influenza,inj,Quad PF,6+ Mos 06/26/2013  . Tdap 06/25/2013    Review of systems: Positive Findings in bold print.  Constitutional:  chills, fatigue, fever, sweats, weight change Communication: Optometrist, sign Ecologist, hand writing, iPad/Android device Head: headaches, head injury Eyes: changes in vision, eye  pain, glaucoma, cataracts, macular degeneration, diplopia, glare,  light sensitivity, eyeglasses or contacts, blindness Ears nose mouth throat: hearing impaired, hearing aids,  ringing in ears, deaf, sign language,  vertigo, nosebleeds,  rhinitis,  cold sores, snoring, swollen glands Cardiovascular: HTN, edema, arrhythmia, pacemaker in place, defibrillator in place, chest pain/tightness, chronic anticoagulation, blood clot, heart failure, MI Peripheral Vascular: leg cramps, varicose veins, blood clots, lymphedema, varicosities Respiratory:  difficulty breathing, denies congestion, SOB, wheezing, cough, emphysema Gastrointestinal: change in appetite or weight, abdominal pain, constipation, diarrhea, nausea, vomiting, vomiting blood, change in bowel habits, abdominal pain, jaundice, rectal bleeding, hemorrhoids, GERD Genitourinary:  nocturia,  pain on urination, polyuria,  blood in urine, Foley catheter, urinary urgency, ESRD on hemodialysis Musculoskeletal: amputation, cramping, stiff joints, painful joints, decreased joint motion, fractures, OA, gout, hemiplegia, paraplegia, uses cane, wheelchair bound, uses walker, uses rollator Skin: +changes in toenails, color change, dryness, itching, mole changes,  rash, wound(s) Neurological: headaches, numbness in feet, paresthesias in feet, burning in feet, fainting,  seizures, change in speech,  headaches, memory problems/poor historian, cerebral palsy, weakness, paralysis, CVA, TIA Endocrine: diabetes, hypothyroidism, hyperthyroidism,  goiter, dry mouth, flushing, heat intolerance,  cold intolerance,  excessive thirst, denies polyuria,  nocturia Hematological:  easy bleeding, excessive bleeding, easy bruising, enlarged lymph nodes, on long term blood thinner, history of past transusions Allergy/immunological:  hives, eczema, frequent infections, multiple drug allergies, seasonal allergies, transplant recipient, multiple food allergies Psychiatric:  anxiety,  depression, mood disorder, suicidal ideations, hallucinations, insomnia  Objective: Vitals:   09/26/19 0852  BP: (!) 135/92  Pulse: 79  Temp: (!) 96.3 F (35.7 C)    74 y.o. Caucasian male WD, WN IN NAD. AAO X 3.  Capillary refill time to digits <4 seconds b/l. Pedal hair absent b/l Skin temperature gradient warm to cool b/l. DP pulses nonpalpable right; palpable on left. PT pulse palpable right; nonpalpable left. Cyanotic appearance of digits 1-5 b/l with dependency. Pallor noted with elevation b/l LE.  Pedal skin is thin shiny, atrophic bilaterally. No interdigital macerations bilaterally. Toenails 1-5 b/l elongated, dystrophic, thickened, crumbly with subungual debris and tenderness to dorsal palpation. Multiple healing scabs noted about digits. No drainage nor signs of infection b/l. Marland Kitchen  Normal muscle strength 5/5 to all lower extremity muscle groups bilaterally, no pain crepitus or joint limitation noted with ROM b/l, bunion deformity noted b/l and hammertoes noted to the  2-5 bilaterally  Protective sensation intact 5/5 intact bilaterally with 10g monofilament b/l Vibratory sensation intact b/l   VAS [place] ABI WITH/WO TBI  Result Date: 08/09/2019 LOWER EXTREMITY DOPPLER STUDY Indications: Peripheral artery disease. High Risk Factors: Diabetes.  Performing Technologist: Ralene Cork RVT  Examination Guidelines: A complete evaluation includes at minimum, Doppler waveform signals and systolic blood pressure reading at the level of bilateral brachial, anterior tibial, and posterior tibial arteries, when vessel segments are accessible. Bilateral testing is considered an integral part of a complete  examination. Photoelectric Plethysmograph (PPG) waveforms and toe systolic pressure readings are included as required and additional duplex testing as needed. Limited examinations for reoccurring indications may be performed as noted.    ABI Findings:  +---------+------------------+-----+----------+--------+  Right    Rt Pressure (mmHg)IndexWaveform  Comment  +---------+------------------+-----+----------+--------+  Brachial 148                                        +---------+------------------+-----+----------+--------+  PTA      165               1.09 triphasic          +---------+------------------+-----+----------+--------+  DP       116               0.77 monophasic         +---------+------------------+-----+----------+--------+  Great Toe138               0.91                    +---------+------------------+-----+----------+--------+ +---------+------------------+-----+---------+-------+   Left     Lt Pressure (mmHg)IndexWaveform Comment +---------+------------------+-----+---------+-------+  Brachial 151                                     +---------+------------------+-----+---------+-------+ PTA      159               1.05 biphasic         +---------+------------------+-----+---------+-------+  DP       157               1.04 triphasic         +---------+------------------+-----+---------+-------+  Great Toe159               1.05                   +---------+------------------+-----+---------+-------+ +-------+-----------+-----------+------------+------------+  ABI/TBIToday's ABIToday's TBIPrevious ABIPrevious TBI +-------+-----------+-----------+------------+------------+  Right  1.09       0.91                                +-------+-----------+-----------+------------+------------+  Left   1.05       1.05                                +-------+-----------+-----------+------------+------------+   No previous ABI.  Summary:   Right: Resting right ankle-brachial index is within normal range. No evidence of significant right lower extremity arterial disease. The right toe-brachial index is normal. RT great toe pressure = 138 mmHg.   ABI by the dorsalis  pedis artery is 0.77/moderate decrease.  Left: Resting left ankle-brachial index is within normal range. No evidence of significant left lower extremity arterial disease. The left toe-brachial index is normal. LT Great toe pressure = 159 mmHg.  *See table(s) above for measurements and observations.  Electronically signed by Deitra Mayo MD on 08/09/2019 at 10:41:46 AM.   Final    1. Pain due to onychomycosis of toenails of both feet   2. Localized edema   3. Type II diabetes mellitus with peripheral circulatory disorder (HCC)   4. Hallux valgus, acquired, bilateral   5. Encounter for diabetic foot  exam [facility])     -Diabetic foot examination performed on today. -Discussed diabetic foot care principles. Literature dispensed on today. Toenails 1-5 b/l were debrided in length and girth with sterile nail nippers and dremel without iatrogenic bleeding.  -Patient to continue soft, supportive shoe gear daily. -Patient to report any pedal injuries to medical professional immediately. -Patient/POA to call should there be question/concern in the interim.

## 2019-11-07 DIAGNOSIS — L570 Actinic keratosis: Secondary | ICD-10-CM | POA: Diagnosis not present

## 2019-11-08 ENCOUNTER — Other Ambulatory Visit: Payer: Self-pay

## 2019-11-08 ENCOUNTER — Ambulatory Visit (INDEPENDENT_AMBULATORY_CARE_PROVIDER_SITE_OTHER): Payer: Medicare Other | Admitting: Nurse Practitioner

## 2019-11-08 ENCOUNTER — Encounter (INDEPENDENT_AMBULATORY_CARE_PROVIDER_SITE_OTHER): Payer: Self-pay | Admitting: Nurse Practitioner

## 2019-11-08 VITALS — BP 128/70 | HR 62 | Temp 98.0°F | Resp 18 | Ht 72.0 in | Wt 192.6 lb

## 2019-11-08 DIAGNOSIS — I4891 Unspecified atrial fibrillation: Secondary | ICD-10-CM | POA: Diagnosis not present

## 2019-11-08 DIAGNOSIS — I10 Essential (primary) hypertension: Secondary | ICD-10-CM | POA: Diagnosis not present

## 2019-11-08 DIAGNOSIS — E1151 Type 2 diabetes mellitus with diabetic peripheral angiopathy without gangrene: Secondary | ICD-10-CM | POA: Diagnosis not present

## 2019-11-08 DIAGNOSIS — E1165 Type 2 diabetes mellitus with hyperglycemia: Secondary | ICD-10-CM | POA: Diagnosis not present

## 2019-11-08 MED ORDER — PRAVASTATIN SODIUM 20 MG PO TABS: 20.0000 mg | ORAL_TABLET | Freq: Every day | ORAL | 3 refills | Status: DC

## 2019-11-08 NOTE — Progress Notes (Signed)
Subjective:  Patient ID: Marcus [name], male    DOB: 1945/10/04  Age: 74 y.o. MRN: [id number]  CC:  Chief Complaint  Patient presents with  . Follow-up  . Atrial Fibrillation  . Diabetes  . Hypertension      HPI  This patient comes in today for follow-up for the above.  Atrial fibrillation: He has a history of atrial fibrillation and is rate controlled with metoprolol.  He is on aspirin, but has refused any other anticoagulation repeatedly in the past CHA2DS2-VASc score is approximately 5.  Diabetes: Last A1c was collected in January 21 it was 6.5.  He continues on Metformin and glipizide.  He has seen an eye doctor within the last year.  Urine was checked for albuminuria and this was negative back in January 2021.  He is not on statin or ACE inhibitor/ARB.  He most likely is due for pneumonia vaccine, but tells me he was told previously to never take another vaccine due to his history of Lonia Blood syndrome which was associated with flu vaccination in the past.  Hypertension: He is on metoprolol alone to help control his blood pressure.  He is tolerating this medication well.   Past Medical History:  Diagnosis Date  . AF (atrial fibrillation) ([place])   . Atrial flutter ([place])   . Blue toes    chronic  . Essential hypertension, benign 04/23/2019  . GERD (gastroesophageal reflux disease)   . Gout   . Guillain Barr syndrome ([place])   . Type II diabetes mellitus, uncontrolled ([place]) 04/23/2019      Family History  Problem Relation Age of Onset  . Diabetes Father   . Colon cancer Father   . Colon cancer Mother     Social History   Social History Narrative   Married for 34 years,lives with wife.Retired.   Social History   Tobacco Use  . Smoking status: Former Smoker    Packs/day: 2.00    Years: 5.00    Pack years: 10.00    Types: Cigarettes    Start date: 11/23/2016    Quit date: 11/24/2016    Years since quitting: 2.9  . Smokeless tobacco: Never  Used  Substance Use Topics  . Alcohol use: No     Current Meds  Medication Sig  . aspirin EC 81 MG tablet Take 1 tablet (81 mg total) by mouth daily.    ROS:  Review of Systems  Constitutional: Negative for fever, malaise/fatigue and weight loss.  Eyes: Negative for blurred vision and double vision.  Respiratory: Negative for cough, shortness of breath and wheezing.   Cardiovascular: Negative for chest pain and palpitations.  Gastrointestinal: Negative for abdominal pain and blood in stool.  Neurological: Negative for dizziness and headaches.     Objective:   Today's Vitals: BP 128/70 (BP Location: Right Arm, Patient Position: Sitting, Cuff Size: Normal)   Pulse 62   Temp 98 F (36.7 C) (Temporal)   Resp 18   Ht 6' (1.829 m)   Wt 192 lb 9.6 oz (87.4 kg)   SpO2 94%   BMI 26.12 kg/m  Vitals with BMI 11/08/2019 09/26/2019 08/21/2019  Height 6' 0"  - 6' 0"   Weight 192 lbs 10 oz - -  BMI 46.28 - -  Systolic 638 177 -  Diastolic 70 92 -  Pulse 62 79 -     Physical Exam Vitals reviewed.  Constitutional:      Appearance: Normal appearance.  HENT:  Head: Normocephalic and atraumatic.  Cardiovascular:     Rate and Rhythm: Normal rate. Rhythm irregular.  Pulmonary:     Effort: Pulmonary effort is normal.     Breath sounds: Normal breath sounds.  Musculoskeletal:     Cervical back: Neck supple.  Skin:    General: Skin is warm and dry.  Neurological:     Mental Status: He is alert and oriented to person, place, and time.  Psychiatric:        Mood and Affect: Mood normal.        Behavior: Behavior normal.        Thought Content: Thought content normal.        Judgment: Judgment normal.          Assessment and Plan   1. Diabetes mellitus with peripheral vascular disease ([place])   2. Essential hypertension, benign   3. Atrial fibrillation with RVR ([place])      Plan: 1.  He will continue on his current medication regimen as prescribed.  We will check A9V and  metabolic panel for further evaluation today.  We had an extensive conversation regarding recommendation that all diabetics be on statin therapy unless there is another contraindication to this.  He does not recall ever being on 1 in the past.  I did discuss I think he would benefit from statin therapy.  I did review his last lipid panel which was collected in January 2021.  His LDL was approximately 101.  With shared decision making I recommended that he start pravastatin, he is willing to try this.  He will follow-up with me in 6 weeks to see how he is tolerating this medication to recheck lipid panel as well as metabolic panel.  He was encouraged to let me know if he experiences any negative side effects from the pravastatin between now and his next appointment.  2.  He will continue on his current medication as prescribed for now.  May consider starting ACE or ARB in the future especially considering his history of diabetes.  3.  I again enforced the importance of being on anticoagulation, but he still would prefer to stay on aspirin alone.  He was encouraged to let me know if he changes his mind.   Tests ordered Orders Placed This Encounter  Procedures  . Hemoglobin A1c  . CMP with eGFR([facility])      Meds ordered this encounter  Medications  . pravastatin (PRAVACHOL) 20 MG tablet    Sig: Take 1 tablet (20 mg total) by mouth daily.    Dispense:  90 tablet    Refill:  3    Order Specific Question:   Supervising Provider    Answer:   Doree Albee [[id number]]    Patient to follow-up in 6 weeks  Ailene Ards, NP

## 2019-11-09 LAB — COMPLETE METABOLIC PANEL WITH GFR
AG Ratio: 1.9 (calc) (ref 1.0–2.5)
ALT: 11 U/L (ref 9–46)
AST: 12 U/L (ref 10–35)
Albumin: 4.2 g/dL (ref 3.6–5.1)
Alkaline phosphatase (APISO): 69 U/L (ref 35–144)
BUN/Creatinine Ratio: 21 (calc) (ref 6–22)
BUN: 26 mg/dL — ABNORMAL HIGH (ref 7–25)
CO2: 26 mmol/L (ref 20–32)
Calcium: 9.5 mg/dL (ref 8.6–10.3)
Chloride: 106 mmol/L (ref 98–110)
Creat: 1.25 mg/dL — ABNORMAL HIGH (ref 0.70–1.18)
GFR, Est African American: 64 mL/min/{1.73_m2} (ref >=60)
GFR, Est Non African American: 56 mL/min/{1.73_m2} — ABNORMAL LOW (ref >=60)
Globulin: 2.2 g/dL (calc) (ref 1.9–3.7)
Glucose, Bld: 205 mg/dL — ABNORMAL HIGH (ref 65–99)
Potassium: 5.2 mmol/L (ref 3.5–5.3)
Sodium: 142 mmol/L (ref 135–146)
Total Bilirubin: 0.4 mg/dL (ref 0.2–1.2)
Total Protein: 6.4 g/dL (ref 6.1–8.1)

## 2019-11-09 LAB — HEMOGLOBIN A1C
Hgb A1c MFr Bld: 6.7 % of total Hgb — ABNORMAL HIGH (ref <5.7)
Mean Plasma Glucose: 146 (calc)
eAG (mmol/L): 8.1 (calc)

## 2019-11-23 ENCOUNTER — Other Ambulatory Visit (INDEPENDENT_AMBULATORY_CARE_PROVIDER_SITE_OTHER): Payer: Self-pay | Admitting: Internal Medicine

## 2019-12-17 ENCOUNTER — Other Ambulatory Visit (INDEPENDENT_AMBULATORY_CARE_PROVIDER_SITE_OTHER): Payer: Self-pay | Admitting: Internal Medicine

## 2019-12-20 ENCOUNTER — Encounter (INDEPENDENT_AMBULATORY_CARE_PROVIDER_SITE_OTHER): Payer: Self-pay | Admitting: Nurse Practitioner

## 2019-12-20 ENCOUNTER — Ambulatory Visit (INDEPENDENT_AMBULATORY_CARE_PROVIDER_SITE_OTHER): Payer: Medicare Other | Admitting: Nurse Practitioner

## 2019-12-20 ENCOUNTER — Other Ambulatory Visit: Payer: Self-pay

## 2019-12-20 VITALS — BP 134/80 | HR 94 | Temp 97.3°F | Resp 15 | Ht 72.0 in | Wt 192.0 lb

## 2019-12-20 DIAGNOSIS — E785 Hyperlipidemia, unspecified: Secondary | ICD-10-CM | POA: Diagnosis not present

## 2019-12-20 DIAGNOSIS — I10 Essential (primary) hypertension: Secondary | ICD-10-CM | POA: Diagnosis not present

## 2019-12-20 DIAGNOSIS — E1140 Type 2 diabetes mellitus with diabetic neuropathy, unspecified: Secondary | ICD-10-CM | POA: Diagnosis not present

## 2019-12-20 LAB — COMPLETE METABOLIC PANEL WITH GFR
AG Ratio: 1.8 (calc) (ref 1.0–2.5)
ALT: 12 U/L (ref 9–46)
AST: 14 U/L (ref 10–35)
Albumin: 4.2 g/dL (ref 3.6–5.1)
Alkaline phosphatase (APISO): 68 U/L (ref 35–144)
BUN/Creatinine Ratio: 20 (calc) (ref 6–22)
BUN: 26 mg/dL — ABNORMAL HIGH (ref 7–25)
CO2: 27 mmol/L (ref 20–32)
Calcium: 9.4 mg/dL (ref 8.6–10.3)
Chloride: 104 mmol/L (ref 98–110)
Creat: 1.31 mg/dL — ABNORMAL HIGH (ref 0.70–1.18)
GFR, Est African American: 61 mL/min/{1.73_m2} (ref >=60)
GFR, Est Non African American: 53 mL/min/{1.73_m2} — ABNORMAL LOW (ref >=60)
Globulin: 2.3 g/dL (calc) (ref 1.9–3.7)
Glucose, Bld: 174 mg/dL — ABNORMAL HIGH (ref 65–99)
Potassium: 4.6 mmol/L (ref 3.5–5.3)
Sodium: 140 mmol/L (ref 135–146)
Total Bilirubin: 0.6 mg/dL (ref 0.2–1.2)
Total Protein: 6.5 g/dL (ref 6.1–8.1)

## 2019-12-20 LAB — LIPID PANEL
Cholesterol: 135 mg/dL (ref <200)
HDL: 40 mg/dL (ref >=40)
LDL Cholesterol (Calc): 73 mg/dL (calc)
Non-HDL Cholesterol (Calc): 95 mg/dL (calc) (ref <130)
Total CHOL/HDL Ratio: 3.4 (calc) (ref <5.0)
Triglycerides: 132 mg/dL (ref <150)

## 2019-12-20 NOTE — Progress Notes (Signed)
Subjective:  Patient ID: Marcus [name], male    DOB: [birth year]  Age: 74 y.o. MRN: [id number]  CC:  Chief Complaint  Patient presents with  . Hypertension  . Diabetes  . Hyperlipidemia      HPI    This patient arrives today for the above.  Hypertension: He continues on metoprolol 50 mg twice daily.  He is tolerating this medication well.  Diabetes: Continues on Metformin 500 mg twice daily and glipizide 5 mg daily.  He tells me he does check his fasting blood sugar at home and has been running in the 100s to 120s.  He denies any hypoglycemic events.  We started him on a statin at last office visit, he tells me he is tolerating this well.  Hyperlipidemia: As stated above patient was started on pravastatin as last office visit.  He is tolerating this well.   Past Medical History:  Diagnosis Date  . AF (atrial fibrillation) ([place])   . Atrial flutter ([place])   . Blue toes    chronic  . Essential hypertension, benign 04/23/2019  . GERD (gastroesophageal reflux disease)   . Gout   . Guillain Barr syndrome ([place])   . Type II diabetes mellitus, uncontrolled ([place]) 04/23/2019      Family History  Problem Relation Age of Onset  . Diabetes Father   . Colon cancer Father   . Colon cancer Mother     Social History   Social History Narrative   Married for 74 years,lives with wife.Retired.   Social History   Tobacco Use  . Smoking status: Former Smoker    Packs/day: 2.00    Years: 5.00    Pack years: 10.00    Types: Cigarettes    Start date: 11/23/2016    Quit date: 11/24/2016    Years since quitting: 3.0  . Smokeless tobacco: Never Used  Substance Use Topics  . Alcohol use: No     Current Meds  Medication Sig  . aspirin EC 81 MG tablet Take 1 tablet (81 mg total) by mouth daily.  Marland Kitchen glipiZIDE (GLUCOTROL) 5 MG tablet TAKE 1 TABLET DAILY.  . metFORMIN (GLUCOPHAGE-XR) 500 MG 24 hr tablet TAKE (1) TABLET TWICE DAILY.  . metoprolol tartrate (LOPRESSOR) 50  MG tablet Take 1 tablet (50 mg total) by mouth 2 (two) times daily.  . pravastatin (PRAVACHOL) 20 MG tablet Take 1 tablet (20 mg total) by mouth daily.    ROS:  Review of Systems  Eyes: Negative for blurred vision.  Respiratory: Negative for cough, shortness of breath and wheezing.   Cardiovascular: Negative for chest pain and palpitations.  Neurological: Negative for dizziness and headaches.     Objective:   Today's Vitals: BP 134/80   Pulse 94   Temp (!) 97.3 F (36.3 C) (Temporal)   Resp 15   Ht 6' (1.829 m)   Wt 192 lb (87.1 kg)   SpO2 97%   BMI 26.04 kg/m  Vitals with BMI 12/20/2019 11/08/2019 09/26/2019  Height 6' 0"  6' 0"  -  Weight 192 lbs 192 lbs 10 oz -  BMI 83.15 17.61 -  Systolic 607 371 062  Diastolic 80 70 92  Pulse 94 62 79     Physical Exam Vitals reviewed.  Constitutional:      Appearance: Normal appearance.  HENT:     Head: Normocephalic and atraumatic.  Cardiovascular:     Rate and Rhythm: Normal rate and regular rhythm.  Pulses:          Dorsalis pedis pulses are 0 on the right side and 0 on the left side.  Pulmonary:     Effort: Pulmonary effort is normal.     Breath sounds: Normal breath sounds.  Musculoskeletal:     Cervical back: Neck supple.     Right foot: No deformity.     Left foot: No deformity.  Feet:     Right foot:     Protective Sensation: 10 sites tested. 1 site sensed.    Skin integrity: Skin integrity normal. No warmth.     Toenail Condition: Right toenails are abnormally thick.     Left foot:     Protective Sensation: 10 sites tested. 0 sites sensed.     Skin integrity: Skin integrity normal. No warmth.     Toenail Condition: Left toenails are abnormally thick.  Skin:    General: Skin is warm and dry.  Neurological:     Mental Status: He is alert and oriented to person, place, and time.  Psychiatric:        Mood and Affect: Mood normal.        Behavior: Behavior normal.        Thought Content: Thought content  normal.        Judgment: Judgment normal.          Assessment and Plan   1. Hyperlipidemia, unspecified hyperlipidemia type   2. Type 2 diabetes mellitus with diabetic neuropathy, without long-term current use of insulin ([place])   3. Essential hypertension, benign      Plan: 1.  He will continue on his pravastatin as prescribed, I will collect lipid panel and metabolic panel for further evaluation today.  2.  He will continue on his current medication as prescribed.  We did discuss possibly starting ACE or ARB for kidney protection, however the patient like to hold off on this for now.  He has significant neuropathy to his bilateral lower extremities, he has been evaluated by vascular surgeon earlier this year for peripheral vascular disease.  ABIs showed good blood flow.  He continues to deny any claudication.  He also follows up with podiatry and I encouraged him to follow with them as scheduled.  3.  Blood pressure is acceptable today, will continue on his current medication regimen as prescribed.   Tests ordered Orders Placed This Encounter  Procedures  . Lipid Panel  . CMP with eGFR([facility])      No orders of the defined types were placed in this encounter.   Patient to follow-up in 3 months.  Ailene Ards, NP

## 2019-12-23 ENCOUNTER — Other Ambulatory Visit (INDEPENDENT_AMBULATORY_CARE_PROVIDER_SITE_OTHER): Payer: Self-pay | Admitting: Internal Medicine

## 2019-12-28 ENCOUNTER — Other Ambulatory Visit: Payer: Self-pay

## 2019-12-28 ENCOUNTER — Ambulatory Visit (INDEPENDENT_AMBULATORY_CARE_PROVIDER_SITE_OTHER): Payer: Medicare Other | Admitting: Podiatry

## 2019-12-28 DIAGNOSIS — B351 Tinea unguium: Secondary | ICD-10-CM | POA: Diagnosis not present

## 2019-12-28 DIAGNOSIS — G610 Guillain-Barre syndrome: Secondary | ICD-10-CM

## 2019-12-28 DIAGNOSIS — L84 Corns and callosities: Secondary | ICD-10-CM | POA: Diagnosis not present

## 2019-12-28 DIAGNOSIS — M79674 Pain in right toe(s): Secondary | ICD-10-CM | POA: Diagnosis not present

## 2019-12-28 DIAGNOSIS — M79675 Pain in left toe(s): Secondary | ICD-10-CM

## 2019-12-28 DIAGNOSIS — E1151 Type 2 diabetes mellitus with diabetic peripheral angiopathy without gangrene: Secondary | ICD-10-CM | POA: Diagnosis not present

## 2019-12-28 NOTE — Patient Instructions (Addendum)
Diabetes Mellitus and Foot Care Foot care is an important part of your health, especially when you have diabetes. Diabetes may cause you to have problems because of poor blood flow (circulation) to your feet and legs, which can cause your skin to:  Become thinner and drier.  Break more easily.  Heal more slowly.  Peel and crack. You may also have nerve damage (neuropathy) in your legs and feet, causing decreased feeling in them. This means that you may not notice minor injuries to your feet that could lead to more serious problems. Noticing and addressing any potential problems early is the best way to prevent future foot problems. How to care for your feet Foot hygiene  Wash your feet daily with warm water and mild soap. Do not use hot water. Then, pat your feet and the areas between your toes until they are completely dry. Do not soak your feet as this can dry your skin.  Trim your toenails straight across. Do not dig under them or around the cuticle. File the edges of your nails with an emery board or nail file.  Apply a moisturizing lotion or petroleum jelly to the skin on your feet and to dry, brittle toenails. Use lotion that does not contain alcohol and is unscented. Do not apply lotion between your toes. Shoes and socks  Wear clean socks or stockings every day. Make sure they are not too tight. Do not wear knee-high stockings since they may decrease blood flow to your legs.  Wear shoes that fit properly and have enough cushioning. Always look in your shoes before you put them on to be sure there are no objects inside.  To break in new shoes, wear them for just a few hours a day. This prevents injuries on your feet. Wounds, scrapes, corns, and calluses  Check your feet daily for blisters, cuts, bruises, sores, and redness. If you cannot see the bottom of your feet, use a mirror or ask someone for help.  Do not cut corns or calluses or try to remove them with medicine.  If you  find a minor scrape, cut, or break in the skin on your feet, keep it and the skin around it clean and dry. You may clean these areas with mild soap and water. Do not clean the area with peroxide, alcohol, or iodine.  If you have a wound, scrape, corn, or callus on your foot, look at it several times a day to make sure it is healing and not infected. Check for: ? Redness, swelling, or pain. ? Fluid or blood. ? Warmth. ? Pus or a bad smell. General instructions  Do not cross your legs. This may decrease blood flow to your feet.  Do not use heating pads or hot water bottles on your feet. They may burn your skin. If you have lost feeling in your feet or legs, you may not know this is happening until it is too late.  Protect your feet from hot and cold by wearing shoes, such as at the beach or on hot pavement.  Schedule a complete foot exam at least once a year (annually) or more often if you have foot problems. If you have foot problems, report any cuts, sores, or bruises to your health care provider immediately. Contact a health care provider if:  You have a medical condition that increases your risk of infection and you have any cuts, sores, or bruises on your feet.  You have an injury that is not   healing.  You have redness on your legs or feet.  You feel burning or tingling in your legs or feet.  You have pain or cramps in your legs and feet.  Your legs or feet are numb.  Your feet always feel cold.  You have pain around a toenail. Get help right away if:  You have a wound, scrape, corn, or callus on your foot and: ? You have pain, swelling, or redness that gets worse. ? You have fluid or blood coming from the wound, scrape, corn, or callus. ? Your wound, scrape, corn, or callus feels warm to the touch. ? You have pus or a bad smell coming from the wound, scrape, corn, or callus. ? You have a fever. ? You have a red line going up your leg. Summary  Check your feet every day  for cuts, sores, red spots, swelling, and blisters.  Moisturize feet and legs daily.  Wear shoes that fit properly and have enough cushioning.  If you have foot problems, report any cuts, sores, or bruises to your health care provider immediately.  Schedule a complete foot exam at least once a year (annually) or more often if you have foot problems. This information is not intended to replace advice given to you by your health care provider. Make sure you discuss any questions you have with your health care provider. Document Revised: 04/04/2019 Document Reviewed: 08/13/2016 Elsevier Patient Education  2020 Elsevier Inc.  

## 2020-01-02 ENCOUNTER — Encounter: Payer: Self-pay | Admitting: Podiatry

## 2020-01-02 NOTE — Progress Notes (Addendum)
Subjective: Marcus [name] presents today at risk foot care. Pt has h/o NIDDM with PAD and painful callus(es) b/l feet and painful mycotic toenails b/l that are difficult to trim. Pain interferes with ambulation. Aggravating factors include wearing enclosed shoe gear. Pain is relieved with periodic professional debridement.   Pt also has h/o GBS.   Doree Albee, MD is patient's PCP.  Last visit was 12/20/2019.  Past Medical History:  Diagnosis Date  . AF (atrial fibrillation) ([place])   . Atrial flutter ([place])   . Blue toes    chronic  . Essential hypertension, benign 04/23/2019  . GERD (gastroesophageal reflux disease)   . Gout   . Guillain Barr syndrome ([place])   . Type II diabetes mellitus, uncontrolled ([place]) 04/23/2019     Current Outpatient Medications on File Prior to Visit  Medication Sig Dispense Refill  . aspirin EC 81 MG tablet Take 1 tablet (81 mg total) by mouth daily. 90 tablet 3  . glipiZIDE (GLUCOTROL) 5 MG tablet TAKE 1 TABLET DAILY. 90 tablet 0  . metFORMIN (GLUCOPHAGE-XR) 500 MG 24 hr tablet TAKE (1) TABLET TWICE DAILY. 60 tablet 3  . metoprolol tartrate (LOPRESSOR) 50 MG tablet Take 1 tablet (50 mg total) by mouth 2 (two) times daily. 180 tablet 3  . pravastatin (PRAVACHOL) 20 MG tablet Take 1 tablet (20 mg total) by mouth daily. 90 tablet 3  . prednisoLONE acetate (PRED FORTE) 1 % ophthalmic suspension     . [DISCONTINUED] glipiZIDE (GLUCOTROL) 5 MG tablet Take 5 mg by mouth daily before breakfast.    . [DISCONTINUED] glipiZIDE (GLUCOTROL) 5 MG tablet TAKE 1 TABLET ONCE DAILY. 30 tablet 3  . [DISCONTINUED] glipiZIDE (GLUCOTROL) 5 MG tablet TAKE 1 TABLET DAILY. 30 tablet 3  . [DISCONTINUED] metFORMIN (GLUCOPHAGE XR) 500 MG 24 hr tablet Take 1 tablet (500 mg total) by mouth 2 (two) times a day. 60 tablet 1  . [DISCONTINUED] metFORMIN (GLUCOPHAGE-XR) 500 MG 24 hr tablet TAKE (1) TABLET TWICE DAILY. 60 tablet 1   No current facility-administered medications  on file prior to visit.     Allergies  Allergen Reactions  . Influenza Vaccines Other (See Comments)    Guillain-Barre syndrome    Objective: Marcus [name] is a pleasant 74 y.o. y.o. Patient Race: White or Caucasian [1]  male in NAD. AAO x 3.  There were no vitals filed for this visit.  Vascular Examination: Neurovascular status unchanged b/l lower extremities. Capillary refill time to digits <4 seconds b/l. Nonpalpable DP pulse(s) right foot. Nonpalpable PT pulse(s) left foot. DP pulse palpable left foot. PT pulse palpable right foot. Pedal hair absent b/l. Skin temperature gradient warm to cool b/l. Cyanotic appearance of digits 1-5 when feet are in dependent position. Pallor noted with LE elevation b/l. No pain with calf compression b/l. +1 pitting edema b/l lower extremities. Evidence of chronic venous insufficiency b/l LE.  Dermatological Examination: Pedal skin is thin shiny, atrophic bilaterally. No open wounds bilaterally. No interdigital macerations bilaterally. Toenails 1-5 b/l elongated, discolored, dystrophic, thickened, crumbly with subungual debris and tenderness to dorsal palpation. Hyperkeratotic lesion(s) submet head 1 left foot, submet head 1 right foot and submet head 5 right foot.  No erythema, no edema, no drainage, no flocculence.  Musculoskeletal: Normal muscle strength 5/5 to all lower extremity muscle groups bilaterally. No pain crepitus or joint limitation noted with ROM b/l. Hallux valgus with bunion deformity noted b/l lower extremities. Hammertoes noted to the L 5th toe  and R 5th toe.  Neurological Examination: Protective sensation intact 5/5 intact bilaterally with 10g monofilament b/l. Vibratory sensation intact b/l. Proprioception intact bilaterally.  Assessment: 1. Pain due to onychomycosis of toenails of both feet   2. Callus   3. Type II diabetes mellitus with peripheral circulatory disorder (HCC)   4. GBS (Guillain Barre syndrome) (HCC)    Class findings of nonpalpable DP pulse right foot and nonpalpable PT pulse left foot. Risk Categorization: High Risk  Patient has one or more of the following: Loss of protective sensation Absent pedal pulses Severe Foot deformity History of foot ulcer Plan: -Examined patient. -No new findings. No new orders. -Continue diabetic foot care principles. Literature dispensed on today.  -Toenails 1-5 b/l were debrided in length and girth with sterile nail nippers and dremel without iatrogenic bleeding.  -Callus(es) submet head 1 left foot, submet head 1 right foot and submet head 5 right foot pared utilizing sterile scalpel blade without complication or incident. Total number debrided =3. -Patient to continue soft, supportive shoe gear daily. Start procedure for diabetic shoes. Patient qualifies based on diagnoses. -Patient to report any pedal injuries to medical professional immediately. -Patient/POA to call should there be question/concern in the interim.  Return in about 9 weeks (around 02/29/2020) for diabetic nail trim.  Marzetta Board, DPM

## 2020-01-10 ENCOUNTER — Telehealth (INDEPENDENT_AMBULATORY_CARE_PROVIDER_SITE_OTHER): Payer: Self-pay

## 2020-01-10 ENCOUNTER — Other Ambulatory Visit (INDEPENDENT_AMBULATORY_CARE_PROVIDER_SITE_OTHER): Payer: Self-pay | Admitting: Nurse Practitioner

## 2020-01-10 DIAGNOSIS — E1140 Type 2 diabetes mellitus with diabetic neuropathy, unspecified: Secondary | ICD-10-CM

## 2020-01-10 MED ORDER — GLUCOSE BLOOD VI STRP: ORAL_STRIP | 3 refills | Status: AC

## 2020-01-10 NOTE — Telephone Encounter (Signed)
I have sent the prescription for the test strips to the patient's pharmacy.

## 2020-01-10 NOTE — Telephone Encounter (Signed)
Marcus [name] is asking if you could call in a refill on his glucose test strips [facility] Test Strips he test once daily sent to [place]

## 2020-01-16 ENCOUNTER — Encounter (INDEPENDENT_AMBULATORY_CARE_PROVIDER_SITE_OTHER): Payer: Self-pay | Admitting: *Deleted

## 2020-01-17 ENCOUNTER — Other Ambulatory Visit: Payer: Self-pay

## 2020-01-17 ENCOUNTER — Ambulatory Visit: Payer: Medicare Other | Admitting: Orthotics

## 2020-01-17 DIAGNOSIS — L84 Corns and callosities: Secondary | ICD-10-CM

## 2020-01-17 DIAGNOSIS — M79674 Pain in right toe(s): Secondary | ICD-10-CM

## 2020-01-17 DIAGNOSIS — M2012 Hallux valgus (acquired), left foot: Secondary | ICD-10-CM

## 2020-01-17 DIAGNOSIS — E1151 Type 2 diabetes mellitus with diabetic peripheral angiopathy without gangrene: Secondary | ICD-10-CM

## 2020-01-17 NOTE — Progress Notes (Signed)
Patient was measured for med necessity extra depth shoes (x2) w/ 3 pair (x6) custom f/o. Patient was measured w/ brannock to determine size and width.  Foam impression mold was achieved and deemed appropriate for fabrication of  cmfo.   See DPM chart notes for further documentation and dx codes for determination of medical necessity.  Appropriate forms will be sent to PCP to verify and sign off on medical necessity.   Patient CDI scanned.

## 2020-02-06 ENCOUNTER — Other Ambulatory Visit (INDEPENDENT_AMBULATORY_CARE_PROVIDER_SITE_OTHER): Payer: Self-pay | Admitting: *Deleted

## 2020-02-07 ENCOUNTER — Other Ambulatory Visit (INDEPENDENT_AMBULATORY_CARE_PROVIDER_SITE_OTHER): Payer: Self-pay | Admitting: *Deleted

## 2020-02-07 DIAGNOSIS — Z800 Family history of malignant neoplasm of digestive organs: Secondary | ICD-10-CM

## 2020-02-07 DIAGNOSIS — Z86010 Personal history of colonic polyps: Secondary | ICD-10-CM

## 2020-02-08 ENCOUNTER — Encounter (INDEPENDENT_AMBULATORY_CARE_PROVIDER_SITE_OTHER): Payer: Self-pay

## 2020-02-08 DIAGNOSIS — E119 Type 2 diabetes mellitus without complications: Secondary | ICD-10-CM | POA: Diagnosis not present

## 2020-02-08 DIAGNOSIS — H35362 Drusen (degenerative) of macula, left eye: Secondary | ICD-10-CM | POA: Diagnosis not present

## 2020-02-08 DIAGNOSIS — H26493 Other secondary cataract, bilateral: Secondary | ICD-10-CM | POA: Diagnosis not present

## 2020-02-08 DIAGNOSIS — Z961 Presence of intraocular lens: Secondary | ICD-10-CM | POA: Diagnosis not present

## 2020-02-08 DIAGNOSIS — H35033 Hypertensive retinopathy, bilateral: Secondary | ICD-10-CM | POA: Diagnosis not present

## 2020-02-08 LAB — HM DIABETES EYE EXAM

## 2020-02-26 ENCOUNTER — Encounter (INDEPENDENT_AMBULATORY_CARE_PROVIDER_SITE_OTHER): Payer: Self-pay | Admitting: *Deleted

## 2020-02-26 ENCOUNTER — Telehealth (INDEPENDENT_AMBULATORY_CARE_PROVIDER_SITE_OTHER): Payer: Self-pay | Admitting: *Deleted

## 2020-02-26 NOTE — Telephone Encounter (Signed)
Patient needs Plenvu (copay card)

## 2020-02-27 MED ORDER — PLENVU 140 G PO SOLR
1.0000 | Freq: Once | ORAL | 0 refills | Status: AC
Start: 2020-02-27 — End: 2020-02-27

## 2020-03-03 ENCOUNTER — Other Ambulatory Visit: Payer: Self-pay

## 2020-03-03 ENCOUNTER — Inpatient Hospital Stay (HOSPITAL_COMMUNITY)
Admission: EM | Admit: 2020-03-03 | Discharge: 2020-03-05 | DRG: 177 | Disposition: A | Discharge status: Home / Self Care | Payer: Medicare Other | Attending: Internal Medicine | Admitting: Internal Medicine

## 2020-03-03 ENCOUNTER — Ambulatory Visit (INDEPENDENT_AMBULATORY_CARE_PROVIDER_SITE_OTHER): Payer: Medicare Other | Admitting: Podiatry

## 2020-03-03 ENCOUNTER — Emergency Department (HOSPITAL_COMMUNITY): Payer: Medicare Other

## 2020-03-03 ENCOUNTER — Encounter: Payer: Self-pay | Admitting: Podiatry

## 2020-03-03 DIAGNOSIS — M79674 Pain in right toe(s): Secondary | ICD-10-CM

## 2020-03-03 DIAGNOSIS — Z79899 Other long term (current) drug therapy: Secondary | ICD-10-CM | POA: Diagnosis not present

## 2020-03-03 DIAGNOSIS — I129 Hypertensive chronic kidney disease with stage 1 through stage 4 chronic kidney disease, or unspecified chronic kidney disease: Secondary | ICD-10-CM | POA: Diagnosis present

## 2020-03-03 DIAGNOSIS — E860 Dehydration: Secondary | ICD-10-CM | POA: Diagnosis present

## 2020-03-03 DIAGNOSIS — Z7982 Long term (current) use of aspirin: Secondary | ICD-10-CM

## 2020-03-03 DIAGNOSIS — G9341 Metabolic encephalopathy: Secondary | ICD-10-CM | POA: Diagnosis not present

## 2020-03-03 DIAGNOSIS — K219 Gastro-esophageal reflux disease without esophagitis: Secondary | ICD-10-CM | POA: Diagnosis present

## 2020-03-03 DIAGNOSIS — R0689 Other abnormalities of breathing: Secondary | ICD-10-CM | POA: Diagnosis not present

## 2020-03-03 DIAGNOSIS — M109 Gout, unspecified: Secondary | ICD-10-CM | POA: Diagnosis present

## 2020-03-03 DIAGNOSIS — U071 COVID-19: Principal | ICD-10-CM

## 2020-03-03 DIAGNOSIS — M2011 Hallux valgus (acquired), right foot: Secondary | ICD-10-CM

## 2020-03-03 DIAGNOSIS — I700 Atherosclerosis of aorta: Secondary | ICD-10-CM | POA: Diagnosis not present

## 2020-03-03 DIAGNOSIS — Z7984 Long term (current) use of oral hypoglycemic drugs: Secondary | ICD-10-CM | POA: Diagnosis not present

## 2020-03-03 DIAGNOSIS — R21 Rash and other nonspecific skin eruption: Secondary | ICD-10-CM | POA: Diagnosis not present

## 2020-03-03 DIAGNOSIS — Z87891 Personal history of nicotine dependence: Secondary | ICD-10-CM

## 2020-03-03 DIAGNOSIS — L84 Corns and callosities: Secondary | ICD-10-CM | POA: Diagnosis not present

## 2020-03-03 DIAGNOSIS — N1831 Chronic kidney disease, stage 3a: Secondary | ICD-10-CM | POA: Diagnosis present

## 2020-03-03 DIAGNOSIS — I4891 Unspecified atrial fibrillation: Secondary | ICD-10-CM | POA: Diagnosis present

## 2020-03-03 DIAGNOSIS — B351 Tinea unguium: Secondary | ICD-10-CM

## 2020-03-03 DIAGNOSIS — N179 Acute kidney failure, unspecified: Secondary | ICD-10-CM | POA: Diagnosis present

## 2020-03-03 DIAGNOSIS — A419 Sepsis, unspecified organism: Secondary | ICD-10-CM | POA: Diagnosis not present

## 2020-03-03 DIAGNOSIS — R6520 Severe sepsis without septic shock: Secondary | ICD-10-CM | POA: Diagnosis not present

## 2020-03-03 DIAGNOSIS — Z9114 Patient's other noncompliance with medication regimen: Secondary | ICD-10-CM

## 2020-03-03 DIAGNOSIS — G610 Guillain-Barre syndrome: Secondary | ICD-10-CM | POA: Diagnosis present

## 2020-03-03 DIAGNOSIS — I7090 Unspecified atherosclerosis: Secondary | ICD-10-CM | POA: Diagnosis not present

## 2020-03-03 DIAGNOSIS — E1151 Type 2 diabetes mellitus with diabetic peripheral angiopathy without gangrene: Secondary | ICD-10-CM

## 2020-03-03 DIAGNOSIS — E1122 Type 2 diabetes mellitus with diabetic chronic kidney disease: Secondary | ICD-10-CM | POA: Diagnosis present

## 2020-03-03 DIAGNOSIS — IMO0002 Reserved for concepts with insufficient information to code with codable children: Secondary | ICD-10-CM | POA: Diagnosis present

## 2020-03-03 DIAGNOSIS — R4182 Altered mental status, unspecified: Secondary | ICD-10-CM | POA: Diagnosis not present

## 2020-03-03 DIAGNOSIS — E1165 Type 2 diabetes mellitus with hyperglycemia: Secondary | ICD-10-CM | POA: Diagnosis present

## 2020-03-03 DIAGNOSIS — J1282 Pneumonia due to coronavirus disease 2019: Secondary | ICD-10-CM | POA: Diagnosis present

## 2020-03-03 DIAGNOSIS — I739 Peripheral vascular disease, unspecified: Secondary | ICD-10-CM | POA: Diagnosis not present

## 2020-03-03 DIAGNOSIS — M79675 Pain in left toe(s): Secondary | ICD-10-CM | POA: Diagnosis not present

## 2020-03-03 DIAGNOSIS — I517 Cardiomegaly: Secondary | ICD-10-CM | POA: Diagnosis not present

## 2020-03-03 DIAGNOSIS — R000 Tachycardia, unspecified: Secondary | ICD-10-CM | POA: Diagnosis not present

## 2020-03-03 DIAGNOSIS — I10 Essential (primary) hypertension: Secondary | ICD-10-CM | POA: Diagnosis not present

## 2020-03-03 DIAGNOSIS — J9811 Atelectasis: Secondary | ICD-10-CM | POA: Diagnosis not present

## 2020-03-03 DIAGNOSIS — M2012 Hallux valgus (acquired), left foot: Secondary | ICD-10-CM

## 2020-03-03 DIAGNOSIS — R0902 Hypoxemia: Secondary | ICD-10-CM | POA: Diagnosis not present

## 2020-03-03 LAB — URINALYSIS, ROUTINE W REFLEX MICROSCOPIC
Bacteria, UA: NONE SEEN
Bilirubin Urine: NEGATIVE
Glucose, UA: 50 mg/dL — AB
Ketones, ur: NEGATIVE mg/dL
Leukocytes,Ua: NEGATIVE
Nitrite: NEGATIVE
Protein, ur: 100 mg/dL — AB
Specific Gravity, Urine: 1.023 (ref 1.005–1.030)
pH: 5 (ref 5.0–8.0)

## 2020-03-03 LAB — COMPREHENSIVE METABOLIC PANEL
ALT: 57 U/L — ABNORMAL HIGH (ref 0–44)
AST: 64 U/L — ABNORMAL HIGH (ref 15–41)
Albumin: 3.9 g/dL (ref 3.5–5.0)
Alkaline Phosphatase: 71 U/L (ref 38–126)
Anion gap: 13 (ref 5–15)
BUN: 43 mg/dL — ABNORMAL HIGH (ref 8–23)
CO2: 20 mmol/L — ABNORMAL LOW (ref 22–32)
Calcium: 8.7 mg/dL — ABNORMAL LOW (ref 8.9–10.3)
Chloride: 104 mmol/L (ref 98–111)
Creatinine, Ser: 1.96 mg/dL — ABNORMAL HIGH (ref 0.61–1.24)
GFR calc Af Amer: 37 mL/min — ABNORMAL LOW (ref >60)
GFR calc non Af Amer: 32 mL/min — ABNORMAL LOW (ref >60)
Glucose, Bld: 161 mg/dL — ABNORMAL HIGH (ref 70–99)
Potassium: 4.2 mmol/L (ref 3.5–5.1)
Sodium: 137 mmol/L (ref 135–145)
Total Bilirubin: 0.9 mg/dL (ref 0.3–1.2)
Total Protein: 7.5 g/dL (ref 6.5–8.1)

## 2020-03-03 LAB — D-DIMER, QUANTITATIVE: D-Dimer, Quant: 7.94 ug/mL-FEU — ABNORMAL HIGH (ref 0.00–0.50)

## 2020-03-03 LAB — LACTIC ACID, PLASMA
Lactic Acid, Venous: 2.1 mmol/L (ref 0.5–1.9)
Lactic Acid, Venous: 2.2 mmol/L (ref 0.5–1.9)

## 2020-03-03 LAB — FIBRINOGEN: Fibrinogen: 580 mg/dL — ABNORMAL HIGH (ref 210–475)

## 2020-03-03 LAB — CBC WITH DIFFERENTIAL/PLATELET
Abs Immature Granulocytes: 0.02 10*3/uL (ref 0.00–0.07)
Basophils Absolute: 0 10*3/uL (ref 0.0–0.1)
Basophils Relative: 1 %
Eosinophils Absolute: 0 10*3/uL (ref 0.0–0.5)
Eosinophils Relative: 0 %
HCT: 52.4 % — ABNORMAL HIGH (ref 39.0–52.0)
Hemoglobin: 17.2 g/dL — ABNORMAL HIGH (ref 13.0–17.0)
Immature Granulocytes: 1 %
Lymphocytes Relative: 22 %
Lymphs Abs: 0.9 10*3/uL (ref 0.7–4.0)
MCH: 30.2 pg (ref 26.0–34.0)
MCHC: 32.8 g/dL (ref 30.0–36.0)
MCV: 92.1 fL (ref 80.0–100.0)
Monocytes Absolute: 0.5 10*3/uL (ref 0.1–1.0)
Monocytes Relative: 11 %
Neutro Abs: 2.7 10*3/uL (ref 1.7–7.7)
Neutrophils Relative %: 65 %
Platelets: 141 10*3/uL — ABNORMAL LOW (ref 150–400)
RBC: 5.69 MIL/uL (ref 4.22–5.81)
RDW: 13.5 % (ref 11.5–15.5)
WBC: 4.2 10*3/uL (ref 4.0–10.5)
nRBC: 0 % (ref 0.0–0.2)

## 2020-03-03 LAB — SARS CORONAVIRUS 2 BY RT PCR (HOSPITAL ORDER, PERFORMED IN ~~LOC~~ HOSPITAL LAB): SARS Coronavirus 2: POSITIVE — AB

## 2020-03-03 LAB — PROTIME-INR
INR: 1.1 (ref 0.8–1.2)
Prothrombin Time: 13.6 seconds (ref 11.4–15.2)

## 2020-03-03 LAB — ABO/RH: ABO/RH(D): O POS

## 2020-03-03 LAB — LACTATE DEHYDROGENASE: LDH: 198 U/L — ABNORMAL HIGH (ref 98–192)

## 2020-03-03 LAB — TRIGLYCERIDES: Triglycerides: 127 mg/dL (ref <150)

## 2020-03-03 LAB — APTT: aPTT: 28 seconds (ref 24–36)

## 2020-03-03 LAB — CBG MONITORING, ED: Glucose-Capillary: 208 mg/dL — ABNORMAL HIGH (ref 70–99)

## 2020-03-03 LAB — PROCALCITONIN: Procalcitonin: 1.24 ng/mL

## 2020-03-03 MED ORDER — ACETAMINOPHEN 500 MG PO TABS
1000.0000 mg | ORAL_TABLET | Freq: Once | ORAL | Status: AC
  Administered 2020-03-03: 1000 mg via ORAL
  Filled 2020-03-03: qty 2

## 2020-03-03 MED ORDER — SODIUM CHLORIDE 0.9% FLUSH
3.0000 mL | Freq: Two times a day (BID) | INTRAVENOUS | Status: DC
  Administered 2020-03-04 – 2020-03-05 (×3): 3 mL via INTRAVENOUS

## 2020-03-03 MED ORDER — SODIUM CHLORIDE 0.9 % IV SOLN
2.0000 g | Freq: Once | INTRAVENOUS | Status: AC
  Administered 2020-03-03: 2 g via INTRAVENOUS
  Filled 2020-03-03: qty 2

## 2020-03-03 MED ORDER — ENOXAPARIN SODIUM 40 MG/0.4ML ~~LOC~~ SOLN
40.0000 mg | SUBCUTANEOUS | Status: DC
  Administered 2020-03-03 – 2020-03-04 (×2): 40 mg via SUBCUTANEOUS
  Filled 2020-03-03 (×2): qty 0.4

## 2020-03-03 MED ORDER — SODIUM CHLORIDE 0.9% FLUSH: 3.0000 mL | INTRAVENOUS | Status: DC | PRN

## 2020-03-03 MED ORDER — PRAVASTATIN SODIUM 10 MG PO TABS
20.0000 mg | ORAL_TABLET | Freq: Every day | ORAL | Status: DC
  Administered 2020-03-04 – 2020-03-05 (×2): 20 mg via ORAL
  Filled 2020-03-03: qty 1
  Filled 2020-03-03: qty 2
  Filled 2020-03-03: qty 1

## 2020-03-03 MED ORDER — LACTATED RINGERS IV SOLN: INTRAVENOUS | Status: AC

## 2020-03-03 MED ORDER — ONDANSETRON HCL 4 MG PO TABS: 4.0000 mg | ORAL_TABLET | Freq: Four times a day (QID) | ORAL | Status: DC | PRN

## 2020-03-03 MED ORDER — POLYETHYLENE GLYCOL 3350 17 G PO PACK: 17.0000 g | PACK | Freq: Every day | ORAL | Status: DC | PRN

## 2020-03-03 MED ORDER — VANCOMYCIN HCL 1250 MG/250ML IV SOLN: 1250.0000 mg | INTRAVENOUS | Status: DC

## 2020-03-03 MED ORDER — SODIUM CHLORIDE 0.9 % IV SOLN
100.0000 mg | Freq: Every day | INTRAVENOUS | Status: DC
  Administered 2020-03-04 – 2020-03-05 (×2): 100 mg via INTRAVENOUS
  Filled 2020-03-03 (×2): qty 20

## 2020-03-03 MED ORDER — METRONIDAZOLE IN NACL 5-0.79 MG/ML-% IV SOLN
500.0000 mg | Freq: Once | INTRAVENOUS | Status: AC
  Administered 2020-03-03: 500 mg via INTRAVENOUS
  Filled 2020-03-03: qty 100

## 2020-03-03 MED ORDER — ASPIRIN EC 81 MG PO TBEC
81.0000 mg | DELAYED_RELEASE_TABLET | Freq: Every day | ORAL | Status: DC
  Administered 2020-03-04 – 2020-03-05 (×2): 81 mg via ORAL
  Filled 2020-03-03 (×2): qty 1

## 2020-03-03 MED ORDER — SODIUM CHLORIDE 0.9 % IV SOLN
100.0000 mg | INTRAVENOUS | Status: AC
  Administered 2020-03-03 (×2): 100 mg via INTRAVENOUS
  Filled 2020-03-03: qty 20

## 2020-03-03 MED ORDER — IPRATROPIUM-ALBUTEROL 20-100 MCG/ACT IN AERS
1.0000 | INHALATION_SPRAY | Freq: Four times a day (QID) | RESPIRATORY_TRACT | Status: DC
  Administered 2020-03-03: 1 via RESPIRATORY_TRACT
  Filled 2020-03-03: qty 4

## 2020-03-03 MED ORDER — SODIUM CHLORIDE 0.9 % IV SOLN
250.0000 mL | INTRAVENOUS | Status: DC | PRN
  Administered 2020-03-05: 250 mL via INTRAVENOUS

## 2020-03-03 MED ORDER — LACTATED RINGERS IV BOLUS (SEPSIS)
1000.0000 mL | Freq: Once | INTRAVENOUS | Status: AC
  Administered 2020-03-03: 1000 mL via INTRAVENOUS

## 2020-03-03 MED ORDER — ONDANSETRON HCL 4 MG/2ML IJ SOLN: 4.0000 mg | Freq: Four times a day (QID) | INTRAMUSCULAR | Status: DC | PRN

## 2020-03-03 MED ORDER — METOPROLOL TARTRATE 50 MG PO TABS
50.0000 mg | ORAL_TABLET | Freq: Two times a day (BID) | ORAL | Status: DC
  Administered 2020-03-03: 50 mg via ORAL
  Filled 2020-03-03: qty 1

## 2020-03-03 MED ORDER — SODIUM CHLORIDE 0.9 % IV SOLN
2.0000 g | Freq: Two times a day (BID) | INTRAVENOUS | Status: DC
  Administered 2020-03-04: 2 g via INTRAVENOUS
  Filled 2020-03-03: qty 2

## 2020-03-03 MED ORDER — DILTIAZEM HCL-DEXTROSE 125-5 MG/125ML-% IV SOLN (PREMIX)
5.0000 mg/h | INTRAVENOUS | Status: DC
  Filled 2020-03-03: qty 125

## 2020-03-03 MED ORDER — VANCOMYCIN HCL IN DEXTROSE 1-5 GM/200ML-% IV SOLN
1000.0000 mg | Freq: Once | INTRAVENOUS | Status: AC
  Administered 2020-03-03: 1000 mg via INTRAVENOUS
  Filled 2020-03-03: qty 200

## 2020-03-03 MED ORDER — ACETAMINOPHEN 325 MG PO TABS: 650.0000 mg | ORAL_TABLET | Freq: Four times a day (QID) | ORAL | Status: DC | PRN

## 2020-03-03 MED ORDER — DEXAMETHASONE SODIUM PHOSPHATE 10 MG/ML IJ SOLN
6.0000 mg | INTRAMUSCULAR | Status: DC
  Administered 2020-03-03 – 2020-03-04 (×2): 6 mg via INTRAVENOUS
  Filled 2020-03-03 (×2): qty 1

## 2020-03-03 MED ORDER — IPRATROPIUM-ALBUTEROL 20-100 MCG/ACT IN AERS: 1.0000 | INHALATION_SPRAY | Freq: Four times a day (QID) | RESPIRATORY_TRACT | Status: DC | PRN

## 2020-03-03 MED ORDER — LACTATED RINGERS IV SOLN: INTRAVENOUS | Status: DC

## 2020-03-03 MED ORDER — INSULIN ASPART 100 UNIT/ML ~~LOC~~ SOLN
0.0000 [IU] | Freq: Three times a day (TID) | SUBCUTANEOUS | Status: DC
  Administered 2020-03-04 – 2020-03-05 (×4): 3 [IU] via SUBCUTANEOUS
  Administered 2020-03-05: 5 [IU] via SUBCUTANEOUS
  Filled 2020-03-03 (×2): qty 1

## 2020-03-03 NOTE — ED Provider Notes (Signed)
[facility] EMERGENCY DEPARTMENT Provider Note   CSN: [id number] Arrival date & time: 03/03/20  1745     History Chief Complaint  Patient presents with  . Altered Mental Status    Marcus [name] is a 74 y.o. male.  Patient on his drive back from [place] to home several people call 911 to the patient driving left of center.  Police arrived and EMS was called due to patient still not driving normally.  MS reported patient was hot.  Patient upon arrival here his temp was 102.5, heart rate was 144, respirations 34, blood pressure 141/90.  Oxygen saturations 90%.  Patient denies any chest pain or shortness of breath or abdominal pain or dysuria.  Patient has a past medical history of atrial fibrillation but refused to be on blood thinners.  Type 2 diabetes essential hypertension.  Patient states he has not had any of the Covid vaccines.  He has a significant allergy to influenza vaccines.  Patient alert and follows all commands and answers questions appropriately.  But there is some question whether his mental status is off a little bit.        Past Medical History:  Diagnosis Date  . AF (atrial fibrillation) ([place])   . Atrial flutter ([place])   . Blue toes    chronic  . Essential hypertension, benign 04/23/2019  . GERD (gastroesophageal reflux disease)   . Gout   . Guillain Barr syndrome ([place])   . Type II diabetes mellitus, uncontrolled ([place]) 04/23/2019    Patient Active Problem List   Diagnosis Date Noted  . History of colonic polyps 08/21/2019  . Peripheral vascular disease ([place]) 08/01/2019  . Type II diabetes mellitus, uncontrolled ([place]) 04/23/2019  . Essential hypertension, benign 04/23/2019  . C. difficile colitis 11/09/2017  . Focal neurological deficit 07/21/2013  . GBS (Guillain Barre syndrome) ([place]) 07/21/2013  . Leg weakness 07/21/2013  . Femur fracture ([place]) 06/25/2013  . Femur fracture, right ([place]) 06/25/2013  . GOUT 07/31/2009  . Atrial fibrillation  with RVR ([place]) 07/31/2009  . Atrial flutter ([place]) 07/31/2009    Past Surgical History:  Procedure Laterality Date  . catheter ablation  12/23/2004  . COLONOSCOPY N/A 09/18/2014   Procedure: COLONOSCOPY;  Surgeon: Rogene Houston, MD;  Location[facility];  Service: Endoscopy;  Laterality: N/A;  830  . EYE SURGERY  2020   Bilateral cataract surgery  . FEMUR IM NAIL Right 06/25/2013   Procedure: INTRAMEDULLARY (IM) RETROGRADE FEMORAL NAILING;  Surgeon: Marybelle Killings, MD;  Location: [facility];  Service: Orthopedics;  Laterality: Right;       Family History  Problem Relation Age of Onset  . Diabetes Father   . Colon cancer Father   . Colon cancer Mother     Social History   Tobacco Use  . Smoking status: Former Smoker    Packs/day: 2.00    Years: 5.00    Pack years: 10.00    Types: Cigarettes    Start date: 11/23/2016    Quit date: 11/24/2016    Years since quitting: 3.2  . Smokeless tobacco: Never Used  Vaping Use  . Vaping Use: Never used  Substance Use Topics  . Alcohol use: No  . Drug use: No    Home Medications Prior to Admission medications   Medication Sig Start Date End Date Taking? Authorizing Provider  aspirin EC 81 MG tablet Take 1 tablet (81 mg total) by mouth daily. 12/22/16   Evans Lance,  MD  glipiZIDE (GLUCOTROL) 5 MG tablet TAKE 1 TABLET DAILY. 12/24/19 03/23/20  Gosrani, Nimish C, MD  glucose blood test strip Use to check your blood glucose once a day. 01/10/20   Ailene Ards, NP  metFORMIN (GLUCOPHAGE-XR) 500 MG 24 hr tablet TAKE (1) TABLET TWICE DAILY. 12/17/19   Doree Albee, MD  metoprolol tartrate (LOPRESSOR) 50 MG tablet Take 1 tablet (50 mg total) by mouth 2 (two) times daily. 04/13/19 12/19/20  Evans Lance, MD  pravastatin (PRAVACHOL) 20 MG tablet Take 1 tablet (20 mg total) by mouth daily. 11/08/19   Ailene Ards, NP  prednisoLONE acetate (PRED FORTE) 1 % ophthalmic suspension  07/09/19   [provider]  glipiZIDE (GLUCOTROL) 5 MG  tablet Take 5 mg by mouth daily before breakfast.    [provider]  glipiZIDE (GLUCOTROL) 5 MG tablet TAKE 1 TABLET ONCE DAILY. 05/28/19   Gosrani, Nimish C, MD  glipiZIDE (GLUCOTROL) 5 MG tablet TAKE 1 TABLET DAILY. 11/23/19   Doree Albee, MD  metFORMIN (GLUCOPHAGE XR) 500 MG 24 hr tablet Take 1 tablet (500 mg total) by mouth 2 (two) times a day. 01/19/19   Noemi Chapel, MD  metFORMIN (GLUCOPHAGE-XR) 500 MG 24 hr tablet TAKE (1) TABLET TWICE DAILY. 07/21/19   Doree Albee, MD    Allergies    Influenza vaccines  Review of Systems   Review of Systems  Constitutional: Negative for chills and fever.  HENT: Negative for congestion, rhinorrhea and sore throat.   Eyes: Negative for visual disturbance.  Respiratory: Negative for cough and shortness of breath.   Cardiovascular: Negative for chest pain and leg swelling.  Gastrointestinal: Negative for abdominal pain, diarrhea, nausea and vomiting.  Genitourinary: Negative for dysuria.  Musculoskeletal: Negative for back pain and neck pain.  Skin: Negative for rash.  Neurological: Negative for dizziness, light-headedness and headaches.  Hematological: Does not bruise/bleed easily.  Psychiatric/Behavioral: Negative for confusion.    Physical Exam Updated Vital Signs BP 138/82   Pulse (!) 120   Temp (!) 102.5 F (39.2 C) (Oral)   Resp (!) 23   Ht 1.88 m (_0 )   Wt 86.2 kg   SpO2 92%   BMI 24.39 kg/m   Physical Exam Vitals and nursing note reviewed.  Constitutional:      General: He is in acute distress.     Appearance: Normal appearance. He is well-developed.  HENT:     Head: Normocephalic and atraumatic.  Eyes:     Extraocular Movements: Extraocular movements intact.     Conjunctiva/sclera: Conjunctivae normal.     Pupils: Pupils are equal, round, and reactive to light.  Cardiovascular:     Rate and Rhythm: Tachycardia present. Rhythm irregular.     Heart sounds: No murmur heard.   Pulmonary:      Effort: Respiratory distress present.     Breath sounds: Normal breath sounds.  Abdominal:     Palpations: Abdomen is soft.     Tenderness: There is no abdominal tenderness.  Musculoskeletal:        General: No swelling. Normal range of motion.     Cervical back: Normal range of motion and neck supple.  Skin:    General: Skin is warm and dry.     Capillary Refill: Capillary refill takes less than 2 seconds.  Neurological:     General: No focal deficit present.     Mental Status: He is alert.     Cranial Nerves:  No cranial nerve deficit.     Sensory: No sensory deficit.     Motor: No weakness.     Coordination: Coordination normal.     Comments: Patient alert and will follow all commands.  Seems to answer questions appropriately.  No speech deficits that no significant motor weakness.     ED Results / Procedures / Treatments   Labs (all labs ordered are listed, but only abnormal results are displayed) Labs Reviewed  SARS CORONAVIRUS 2 BY RT PCR ([place]) - Abnormal; Notable for the following components:      Result Value   SARS Coronavirus 2 POSITIVE (*)    All other components within normal limits  LACTIC ACID, PLASMA - Abnormal; Notable for the following components:   Lactic Acid, Venous 2.1 (*)    All other components within normal limits  COMPREHENSIVE METABOLIC PANEL - Abnormal; Notable for the following components:   CO2 20 (*)    Glucose, Bld 161 (*)    BUN 43 (*)    Creatinine, Ser 1.96 (*)    Calcium 8.7 (*)    AST 64 (*)    ALT 57 (*)    GFR calc non Af Amer 32 (*)    GFR calc Af Amer 37 (*)    All other components within normal limits  CBC WITH DIFFERENTIAL/PLATELET - Abnormal; Notable for the following components:   Hemoglobin 17.2 (*)    HCT 52.4 (*)    Platelets 141 (*)    All other components within normal limits  URINALYSIS, ROUTINE W REFLEX MICROSCOPIC - Abnormal; Notable for the following components:    APPearance HAZY (*)    Glucose, UA 50 (*)    Hgb urine dipstick SMALL (*)    Protein, ur 100 (*)    All other components within normal limits  CULTURE, BLOOD (ROUTINE X 2)  CULTURE, BLOOD (ROUTINE X 2)  URINE CULTURE  PROTIME-INR  APTT  LACTIC ACID, PLASMA  D-DIMER, QUANTITATIVE (NOT AT [facility])  PROCALCITONIN  LACTATE DEHYDROGENASE  FERRITIN  TRIGLYCERIDES  FIBRINOGEN  C-REACTIVE PROTEIN    EKG EKG Interpretation  Date/Time:  Monday March 03 2020 18:12:36 EDT Ventricular Rate:  155 PR Interval:    QRS Duration: 96 QT Interval:  275 QTC Calculation: 442 R Axis:   128 Text Interpretation: Atrial fibrillation with rapid V-rate Right axis deviation Probable anteroseptal infarct, old Repolarization abnormality, prob rate related Long standing atril fib by past EKG's Confirmed by Fredia Sorrow [phone]) on 03/03/2020 6:30:14 PM   Radiology DG Chest Port 1 View  Result Date: 03/03/2020 CLINICAL DATA:  74 year old male with sepsis. EXAM: PORTABLE CHEST 1 VIEW COMPARISON:  None. FINDINGS: Minimal left lung base atelectasis. Mild cardiomegaly. No congestion or edema. No focal consolidation, pleural effusion, or pneumothorax. Atherosclerotic calcification of the aortic arch. No acute osseous pathology. IMPRESSION: No focal consolidation. Electronically Signed   By: Anner Crete M.D.   On: 03/03/2020 18:47    Procedures Procedures (including critical care time)  CRITICAL CARE Performed by: Fredia Sorrow Total critical care time: 45 minutes Critical care time was exclusive of separately billable procedures and treating other patients. Critical care was necessary to treat or prevent imminent or life-threatening deterioration. Critical care was time spent personally by me on the following activities: development of treatment plan with patient and/or surrogate as well as nursing, discussions with consultants, evaluation of patient's response to treatment, examination of patient,  obtaining history from patient or  surrogate, ordering and performing treatments and interventions, ordering and review of laboratory studies, ordering and review of radiographic studies, pulse oximetry and re-evaluation of patient's condition.   Medications Ordered in ED Medications  lactated ringers infusion (0 mLs Intravenous Hold 03/03/20 1845)  vancomycin (VANCOCIN) IVPB 1000 mg/200 mL premix (has no administration in time range)  lactated ringers bolus 1,000 mL (1,000 mLs Intravenous New Bag/Given 03/03/20 1853)  ceFEPIme (MAXIPIME) 2 g in sodium chloride 0.9 % 100 mL IVPB (2 g Intravenous New Bag/Given 03/03/20 1856)  metroNIDAZOLE (FLAGYL) IVPB 500 mg (500 mg Intravenous New Bag/Given 03/03/20 1857)  acetaminophen (TYLENOL) tablet 1,000 mg (1,000 mg Oral Given 03/03/20 1851)    ED Course  I have reviewed the triage vital signs and the nursing notes.  Pertinent labs & imaging results that were available during my care of the patient were reviewed by me and considered in my medical decision making (see chart for details).    MDM Rules/Calculators/A&P                         Patient met sepsis criteria based on vital signs upon arrival.  Significant fever tachypneic tachycardic not hypotensive.   Patient started on sepsis protocol.  Did not meet criteria for 30 cc/kg bolus.  But since he was tachycardic gave him 1 L of lactated Ringer's started on 150 cc of normal saline lactated Ringer's.  Chest x-ray was negative urine was negative lactic acid came back at 2.1.  No leukocytosis.  Patient's BUN and creatinine was elevated consistent with some acute kidney injury.  And patient's Covid testing was positive.  Most likely patient's presentation is due to a Covid infection.  No evidence of pneumonia.  Initial oxygen saturations on room air were 90% but now is up to 94% on room air.  Patient was given Tylenol fever is now down from the 102 that he arrived with.   Tachycardia improved consistent  with an atrial fibrillation which she has a longstanding history of is not on blood thinners as per his refusal in the past.  Rate is now down to 100-1 20 range.   Patient's mental status may be a little off but no significant focal deficit.  His speech is good.  Follows commands good moves all extremities.   Based on the Covid had the pharmacist start him on remdesivir.  Also ordered the Covid parameter labs.  Hospitalist wanted pro calcitonin ordered.  And wanted head CT which has been ordered and wanted him to started on a Cardizem drip.  Hospitalist will see patient and admit.   Final Clinical Impression(s) / ED Diagnoses Final diagnoses:  Sepsis, due to unspecified organism, unspecified whether acute organ dysfunction present ([place])  AKI (acute kidney injury) ([place])  COVID-19 virus infection  Atrial fibrillation with RVR [facility])    Rx / DC Orders ED Discharge Orders    None       Fredia Sorrow, MD 03/03/20 2044

## 2020-03-03 NOTE — ED Notes (Signed)
Date and time results received: 03/03/20 1952  Test: Covid  Critical Value: positive   Name of Provider Notified: Zacowski  Orders Received? Or Actions Taken?: na

## 2020-03-03 NOTE — ED Notes (Signed)
Wife called, updated on plan of care

## 2020-03-03 NOTE — ED Triage Notes (Signed)
Pt was driving from [place] back home. Several people called 911 due to patient driving left of center. Police arrived and EMS was called due to patient still not driving normally. EMS reports VSS. Pt hot with EMS.

## 2020-03-03 NOTE — H&P (Signed)
History and Physical:    Marcus [name]   CHE:[id number] DOB: 1946-07-21 DOA: 03/03/2020  Referring MD/provider: Dr Roderic Palau PCP: Doree Albee, MD   Patient coming from: Home  Chief Complaint: Altered Mental Status  History of Present Illness:   Marcus [name] is an 74 y.o. male with PMH significant for HTN, DM 2, history of Guillain-Barr syndrome secondary to flu vaccine 2015, PVD who was brought in by the [facility] today due to erratic driving.  Patient himself states he does not know why he is here.  He states he feels perfectly fine.  Denies being confused.  Notes that he was stopped by the sheriff but again does not know why he was stopped by the sheriff.  He states that although he has not spoken with his wife he knows that the sheriff called his wife about him.  At this point patient states he feels fine.  After speaking with patient's wife Marcus [name], a different story emerges.  According to her, for the past week, patient has been profoundly fatigued with shortness of breath and DOE.  He apparently has not been able to walk around the house like he normally does.  This morning, he apparently could not climb the 4 steps into the house because he was so tired and short of breath.  She also notes that  he has not "been comprehending, he has not been hearing me."  She apparently would tell him things and he would not remember what she just told him.  She has also noticed that he has not been taking his medications properly. She has noted that his HR has been much higher than usual, notes his heart rate runs in the 90s and last week his heart rate was in the 120s to 130s.  She also notes that his sugars have been running much higher, 280 on last check where his sugars are usually under better control.  She thinks that he has not been taking his medications properly.  This morning patient's wife states that she noted a couple of bloodstained napkins in the  patient's bathroom.  She also noticed a red ring around the toilet last week when she went to clean it.  Mrs. Tones states that she has been trying to get him to come to the hospital for the past week however he has said that he has been feeling fine even though she knows that he is not.  She has been worried that he has Covid and he has adamantly refused and stated that he did not have Covid.  When she was called by the sheriff's office earlier today, she thought this was her opportunity to get into the hospital.  Patient denies recent fevers or chills.  Denies nausea vomiting or diarrhea.  Denies loss of appetite or smell or taste. Denies any shortness of breath or dyspnea on exertion.  Denies chest pain or palpitations.  Denies abdominal pain.   ED Course:  The patient was noted to be febrile and to be tachycardic to 144 on arrival.  He was relatively normotensive with O2 saturations 90% on room air.  Laboratory data was notable for acute kidney injury with a creatinine of 1.9, lactate 2.1 and a negative chest x-ray.  Patient was treated with fluid resuscitation and started on broad-spectrum antibiotics for sepsis protocol including vancomycin, cefepime and Flagyl.  Covid test subsequently came back positive.  Inflammatory markers and procalcitonin are still pending.  ROS:  ROS   Review of Systems: As per HPI  Past Medical History:   Past Medical History:  Diagnosis Date  . AF (atrial fibrillation) ([place])   . Atrial flutter ([place])   . Blue toes    chronic  . Essential hypertension, benign 04/23/2019  . GERD (gastroesophageal reflux disease)   . Gout   . Guillain Barr syndrome ([place])   . Type II diabetes mellitus, uncontrolled ([place]) 04/23/2019    Past Surgical History:   Past Surgical History:  Procedure Laterality Date  . catheter ablation  12/23/2004  . COLONOSCOPY N/A 09/18/2014   Procedure: COLONOSCOPY;  Surgeon: Rogene Houston, MD;  [facility];  Service:  Endoscopy;  Laterality: N/A;  830  . EYE SURGERY  2020   Bilateral cataract surgery  . FEMUR IM NAIL Right 06/25/2013   Procedure: INTRAMEDULLARY (IM) RETROGRADE FEMORAL NAILING;  Surgeon: Marybelle Killings, MD;  Location: [place];  Service: Orthopedics;  Laterality: Right;    Social History:   Social History   Socioeconomic History  . Marital status: Married    Spouse name: Not on file  . Number of children: Not on file  . Years of education: Not on file  . Highest education level: Not on file  Occupational History  . Not on file  Tobacco Use  . Smoking status: Former Smoker    Packs/day: 2.00    Years: 5.00    Pack years: 10.00    Types: Cigarettes    Start date: 11/23/2016    Quit date: 11/24/2016    Years since quitting: 3.2  . Smokeless tobacco: Never Used  Vaping Use  . Vaping Use: Never used  Substance and Sexual Activity  . Alcohol use: No  . Drug use: No  . Sexual activity: Not on file  Other Topics Concern  . Not on file  Social History Narrative   Married for 41 years,lives with wife.Retired.   Social Determinants of Health   Financial Resource Strain:   . Difficulty of Paying Living Expenses:   Food Insecurity:   . Worried About Charity fundraiser in the Last Year:   . Arboriculturist in the Last Year:   Transportation Needs:   . Film/video editor (Medical):   Marland Kitchen Lack of Transportation (Non-Medical):   Physical Activity:   . Days of Exercise per Week:   . Minutes of Exercise per Session:   Stress:   . Feeling of Stress :   Social Connections:   . Frequency of Communication with Friends and Family:   . Frequency of Social Gatherings with Friends and Family:   . Attends Religious Services:   . Active Member of Clubs or Organizations:   . Attends Archivist Meetings:   Marland Kitchen Marital Status:   Intimate Partner Violence:   . Fear of Current or Ex-Partner:   . Emotionally Abused:   Marland Kitchen Physically Abused:   . Sexually Abused:     Allergies    Influenza vaccines  Family history:   Family History  Problem Relation Age of Onset  . Diabetes Father   . Colon cancer Father   . Colon cancer Mother     Current Medications:   Prior to Admission medications   Medication Sig Start Date End Date Taking? Authorizing Provider  aspirin EC 81 MG tablet Take 1 tablet (81 mg total) by mouth daily. 12/22/16  Yes Evans Lance, MD  glipiZIDE (GLUCOTROL) 5 MG tablet TAKE 1  TABLET DAILY. 12/24/19 03/23/20 Yes Gosrani, Nimish C, MD  glucose blood test strip Use to check your blood glucose once a day. 01/10/20  Yes Ailene Ards, NP  metFORMIN (GLUCOPHAGE-XR) 500 MG 24 hr tablet TAKE (1) TABLET TWICE DAILY. Patient taking differently: Take 500 mg by mouth 2 (two) times daily.  12/17/19  Yes Gosrani, Nimish C, MD  metoprolol tartrate (LOPRESSOR) 50 MG tablet Take 1 tablet (50 mg total) by mouth 2 (two) times daily. 04/13/19 12/19/20 Yes Evans Lance, MD  pravastatin (PRAVACHOL) 20 MG tablet Take 1 tablet (20 mg total) by mouth daily. 11/08/19  Yes Ailene Ards, NP  prednisoLONE acetate (PRED FORTE) 1 % ophthalmic suspension  07/09/19   [provider]  glipiZIDE (GLUCOTROL) 5 MG tablet Take 5 mg by mouth daily before breakfast.    [provider]  glipiZIDE (GLUCOTROL) 5 MG tablet TAKE 1 TABLET ONCE DAILY. 05/28/19   Gosrani, Nimish C, MD  glipiZIDE (GLUCOTROL) 5 MG tablet TAKE 1 TABLET DAILY. 11/23/19   Doree Albee, MD  metFORMIN (GLUCOPHAGE XR) 500 MG 24 hr tablet Take 1 tablet (500 mg total) by mouth 2 (two) times a day. 01/19/19   Noemi Chapel, MD  metFORMIN (GLUCOPHAGE-XR) 500 MG 24 hr tablet TAKE (1) TABLET TWICE DAILY. 07/21/19   Doree Albee, MD    Physical Exam:   Vitals:   03/03/20 1900 03/03/20 1930 03/03/20 2000 03/03/20 2007  BP: 138/79 103/61 132/67   Pulse: 86 60 (!) 109   Resp: (!) 25     Temp:    (!) 100.4 F (38 C)  TempSrc:    Oral  SpO2: 93% 92% 94%   Weight:      Height:         Physical Exam: Blood pressure 132/67, pulse (!) 109, temperature (!) 100.4 F (38 C), temperature source Oral, resp. rate (!) 25, height 6' 2"  (1.88 m), weight 86.2 kg, SpO2 94 %. Gen: Relatively well-appearing man sitting up in stretcher eating potato chips and drinking water in no acute distress.  He is conversant and appears to be logical and coherent. Eyes: sclera anicteric, conjuctiva mildly injected bilaterally CVS: S1-S2, regulary, no gallops Respiratory:  decreased air entry likely secondary to decreased inspiratory effort GI: NABS, abdomen is firm but not hard no tenderness to light or deep palpation.   LE: No edema. No cyanosis Neuro: A/O x 3, Moving all extremities equally with normal strength. Psych: Difficult to assess, patient certainly seems logical and coherent however after conversation with his wife it is clear that he is in significant denial.  Data Review:    Labs: Basic Metabolic Panel: Recent Labs  Lab 03/03/20 1846  NA 137  K 4.2  CL 104  CO2 20*  GLUCOSE 161*  BUN 43*  CREATININE 1.96*  CALCIUM 8.7*   Liver Function Tests: Recent Labs  Lab 03/03/20 1846  AST 64*  ALT 57*  ALKPHOS 71  BILITOT 0.9  PROT 7.5  ALBUMIN 3.9   No results for input(s): LIPASE, AMYLASE in the last 168 hours. No results for input(s): AMMONIA in the last 168 hours. CBC: Recent Labs  Lab 03/03/20 1846  WBC 4.2  NEUTROABS 2.7  HGB 17.2*  HCT 52.4*  MCV 92.1  PLT 141*   Cardiac Enzymes: No results for input(s): CKTOTAL, CKMB, CKMBINDEX, TROPONINI in the last 168 hours.  BNP (last 3 results) No results for input(s): PROBNP in the last 8760 hours. CBG: No results  for input(s): GLUCAP in the last 168 hours.  Urinalysis    Component Value Date/Time   COLORURINE YELLOW 03/03/2020 1824   APPEARANCEUR HAZY (A) 03/03/2020 1824   LABSPEC 1.023 03/03/2020 1824   PHURINE 5.0 03/03/2020 1824   GLUCOSEU 50 (A) 03/03/2020 1824   HGBUR SMALL (A) 03/03/2020 1824    BILIRUBINUR NEGATIVE 03/03/2020 1824   KETONESUR NEGATIVE 03/03/2020 1824   PROTEINUR 100 (A) 03/03/2020 1824   UROBILINOGEN 1.0 07/21/2013 2130   NITRITE NEGATIVE 03/03/2020 1824   LEUKOCYTESUR NEGATIVE 03/03/2020 1824      Radiographic Studies: DG Chest Port 1 View  Result Date: 03/03/2020 CLINICAL DATA:  74 year old male with sepsis. EXAM: PORTABLE CHEST 1 VIEW COMPARISON:  None. FINDINGS: Minimal left lung base atelectasis. Mild cardiomegaly. No congestion or edema. No focal consolidation, pleural effusion, or pneumothorax. Atherosclerotic calcification of the aortic arch. No acute osseous pathology. IMPRESSION: No focal consolidation. Electronically Signed   By: Anner Crete M.D.   On: 03/03/2020 18:47    EKG: Independently reviewed. Atrial fibrillation at 155, RAD at 126.  Q waves V1 V2.  No acute ST-T wave changes.   Assessment/Plan:   Principal Problem:   Acute metabolic encephalopathy Active Problems:   Atrial fibrillation with RVR (HCC)   GBS (Guillain Barre syndrome) (HCC)   Type II diabetes mellitus, uncontrolled (HCC)   Essential hypertension, benign   Peripheral vascular disease ([place])  74 year old man with history of Guillain-Barr syndrome secondary to flu vaccine now presents with acute metabolic encephalopathy most likely secondary to Covid infection.  Also has A. fib with RVR possibly secondary to medication noncompliance.  Patient is in denial about his symptoms.  Altered Mental Status Sounds like patient's had some metabolic encephalopathy with memory difficulties for the past week. On history, patient certainly sounds logical and coherent however the history provides is at odds with that provided by the sheriff as well as his wife.  Patient is either in denial or confused or both. Head CT is pending but I suspect this is from acute Covid infection  Covid Infection Patient is unvaccinated secondary to Guillain-Barr syndrome in 2015 from flu  vaccine. Inflammatory markers are pending. Chest x-ray is negative although his O2 saturations are low normal on room air.  Patient does have right axis deviation on EKG that is old so he may well have some level of lung dysfunction not yet diagnosed. Patient started on on steroids and remdesivir Oxygen as needed Will also continue broad-spectrum antibiotics as ordered in the ED until we have a procalcitonin back  Possible hemoptysis Patient's wife saw bloodstained napkins in patient's bathroom that she thought had phlegm in them. Would have low threshold for CT angiogram depending on what D-dimer comes back as.  AKI Patient received 1 L lactated Ringer's in the ED Will continue LR at 100cc/hr x 1 liter overnight.  Would expect to see improvement in kidney function with treatment of infection and fluids.  Atrial Fibrillation with RVR Patient's wife states he is not been taking his medications regularly, apparently his heart rate is normally well controlled around 90. Patient was started on Cardizem drip for better rate control. Will restart patient's metoprolol tartrate 50 twice daily today and hope to taper off drip tomorrow.  DM2 Hold glipizide and Metformin SSI AC at bedtime  HTN Continue metoprolol as noted above    Other information:   DVT prophylaxis: Lovenox ordered. Code Status: Full Family Communication: Long conversation with patient's wife Marcus [name] Disposition Plan: Home  Consults called: None Admission status: Inpatient  [facility]  If 7PM-7AM, please contact night-coverage [email or web address] Password [facility] 03/03/2020, 8:39 PM

## 2020-03-03 NOTE — Progress Notes (Addendum)
Pharmacy Antibiotic Note  Marcus [name] is a 74 y.o. male admitted on 03/03/2020 with sepsis.  Pharmacy has been consulted for vanc/cefepime dosing.  Pt presented via EMS with altered mental status. Pt was found to be febrile in the ED. Empiric vanc/cefepime ordered to r/o infection. COVID positive so will likely get remdesivir - ok per Dr Rogene Houston.   Scr 1.96 (BL ~1.2) WBC wnl COVID+  Plan: Vanc 1gm IV x1 then 1.25g IV q24 Cefepime 2g IV x1 then 2g IV q12 Level as needed Remdesivir 261m IV x1 then 1065mIV q24 x4  Height: 6' 2"  (188 cm) Weight: 86.2 kg (190 lb) IBW/kg (Calculated) : 82.2  Temp (24hrs), Avg:102.5 F (39.2 C), Min:102.5 F (39.2 C), Max:102.5 F (39.2 C)  No results for input(s): WBC, CREATININE, LATICACIDVEN, VANCOTROUGH, VANCOPEAK, VANCORANDOM, GENTTROUGH, GENTPEAK, GENTRANDOM, TOBRATROUGH, TOBRAPEAK, TOBRARND, AMIKACINPEAK, AMIKACINTROU, AMIKACIN in the last 168 hours.  CrCl cannot be calculated (Patient's most recent lab result is older than the maximum 21 days allowed.).    Allergies  Allergen Reactions  . Influenza Vaccines Other (See Comments)    Guillain-Barre syndrome    Antimicrobials this admission: 8/9 vanc>> 8/9 cefepime>> 8/9 remdesivir>>8/13  Dose adjustments this admission:   Microbiology results: 8/9 blood>> 8/9 COVID+  MiOnnie BoerPharmD, [email or web address], CPP Infectious Disease Pharmacist 03/03/2020 6:42 PM

## 2020-03-03 NOTE — ED Notes (Signed)
Date and time results received: 03/03/20 2200  Test: Lactic Acid Critical Value: 2.2  Name of Provider Notified: Zackowski  Orders Received? Or Actions Taken?: na

## 2020-03-03 NOTE — ED Notes (Signed)
Date and time results received: 03/03/20 1927 (use smartphrase ".now" to insert current time)  Test: Lactic Acid Critical Value: 2.1  Name of Provider Notified: Zackowski  Orders Received? Or Actions Taken?:

## 2020-03-04 ENCOUNTER — Other Ambulatory Visit: Payer: Self-pay

## 2020-03-04 ENCOUNTER — Encounter (HOSPITAL_COMMUNITY): Payer: Self-pay | Admitting: Internal Medicine

## 2020-03-04 DIAGNOSIS — I739 Peripheral vascular disease, unspecified: Secondary | ICD-10-CM

## 2020-03-04 DIAGNOSIS — U071 COVID-19: Secondary | ICD-10-CM | POA: Diagnosis present

## 2020-03-04 DIAGNOSIS — I10 Essential (primary) hypertension: Secondary | ICD-10-CM

## 2020-03-04 DIAGNOSIS — G610 Guillain-Barre syndrome: Secondary | ICD-10-CM

## 2020-03-04 DIAGNOSIS — N179 Acute kidney failure, unspecified: Secondary | ICD-10-CM

## 2020-03-04 DIAGNOSIS — N1831 Chronic kidney disease, stage 3a: Secondary | ICD-10-CM

## 2020-03-04 LAB — URINE CULTURE: Culture: NO GROWTH

## 2020-03-04 LAB — CBC WITH DIFFERENTIAL/PLATELET
Abs Immature Granulocytes: 0.01 10*3/uL (ref 0.00–0.07)
Basophils Absolute: 0 10*3/uL (ref 0.0–0.1)
Basophils Relative: 1 %
Eosinophils Absolute: 0 10*3/uL (ref 0.0–0.5)
Eosinophils Relative: 0 %
HCT: 46.7 % (ref 39.0–52.0)
Hemoglobin: 15 g/dL (ref 13.0–17.0)
Immature Granulocytes: 0 %
Lymphocytes Relative: 34 %
Lymphs Abs: 1 10*3/uL (ref 0.7–4.0)
MCH: 30.4 pg (ref 26.0–34.0)
MCHC: 32.1 g/dL (ref 30.0–36.0)
MCV: 94.7 fL (ref 80.0–100.0)
Monocytes Absolute: 0.2 10*3/uL (ref 0.1–1.0)
Monocytes Relative: 7 %
Neutro Abs: 1.7 10*3/uL (ref 1.7–7.7)
Neutrophils Relative %: 58 %
Platelets: 124 10*3/uL — ABNORMAL LOW (ref 150–400)
RBC: 4.93 MIL/uL (ref 4.22–5.81)
RDW: 13.8 % (ref 11.5–15.5)
WBC: 2.9 10*3/uL — ABNORMAL LOW (ref 4.0–10.5)
nRBC: 0 % (ref 0.0–0.2)

## 2020-03-04 LAB — COMPREHENSIVE METABOLIC PANEL
ALT: 44 U/L (ref 0–44)
AST: 49 U/L — ABNORMAL HIGH (ref 15–41)
Albumin: 3.1 g/dL — ABNORMAL LOW (ref 3.5–5.0)
Alkaline Phosphatase: 59 U/L (ref 38–126)
Anion gap: 9 (ref 5–15)
BUN: 39 mg/dL — ABNORMAL HIGH (ref 8–23)
CO2: 23 mmol/L (ref 22–32)
Calcium: 8.4 mg/dL — ABNORMAL LOW (ref 8.9–10.3)
Chloride: 107 mmol/L (ref 98–111)
Creatinine, Ser: 1.46 mg/dL — ABNORMAL HIGH (ref 0.61–1.24)
GFR calc Af Amer: 53 mL/min — ABNORMAL LOW (ref >60)
GFR calc non Af Amer: 46 mL/min — ABNORMAL LOW (ref >60)
Glucose, Bld: 237 mg/dL — ABNORMAL HIGH (ref 70–99)
Potassium: 5.3 mmol/L — ABNORMAL HIGH (ref 3.5–5.1)
Sodium: 139 mmol/L (ref 135–145)
Total Bilirubin: 0.8 mg/dL (ref 0.3–1.2)
Total Protein: 6.2 g/dL — ABNORMAL LOW (ref 6.5–8.1)

## 2020-03-04 LAB — HEMOGLOBIN A1C
Hgb A1c MFr Bld: 7.3 % — ABNORMAL HIGH (ref 4.8–5.6)
Mean Plasma Glucose: 162.81 mg/dL

## 2020-03-04 LAB — GLUCOSE, CAPILLARY: Glucose-Capillary: 200 mg/dL — ABNORMAL HIGH (ref 70–99)

## 2020-03-04 LAB — C-REACTIVE PROTEIN
CRP: 7.9 mg/dL — ABNORMAL HIGH (ref <1.0)
CRP: 7.1 mg/dL — ABNORMAL HIGH (ref <1.0)

## 2020-03-04 LAB — CBG MONITORING, ED
Glucose-Capillary: 214 mg/dL — ABNORMAL HIGH (ref 70–99)
Glucose-Capillary: 227 mg/dL — ABNORMAL HIGH (ref 70–99)
Glucose-Capillary: 211 mg/dL — ABNORMAL HIGH (ref 70–99)

## 2020-03-04 LAB — TSH: TSH: 2.307 u[IU]/mL (ref 0.350–4.500)

## 2020-03-04 LAB — VITAMIN B12: Vitamin B-12: 116 pg/mL — ABNORMAL LOW (ref 180–914)

## 2020-03-04 LAB — VITAMIN D 25 HYDROXY (VIT D DEFICIENCY, FRACTURES): Vit D, 25-Hydroxy: 27.73 ng/mL — ABNORMAL LOW (ref 30–100)

## 2020-03-04 LAB — D-DIMER, QUANTITATIVE: D-Dimer, Quant: 2.89 ug/mL-FEU — ABNORMAL HIGH (ref 0.00–0.50)

## 2020-03-04 LAB — PROCALCITONIN: Procalcitonin: 0.82 ng/mL

## 2020-03-04 LAB — FERRITIN: Ferritin: 1724 ng/mL — ABNORMAL HIGH (ref 24–336)

## 2020-03-04 MED ORDER — METOPROLOL TARTRATE 50 MG PO TABS
75.0000 mg | ORAL_TABLET | Freq: Two times a day (BID) | ORAL | Status: DC
  Administered 2020-03-04 – 2020-03-05 (×3): 75 mg via ORAL
  Filled 2020-03-04 (×3): qty 1

## 2020-03-04 NOTE — Progress Notes (Signed)
Subjective: Marcus [name] presents today at risk foot care. Pt has h/o NIDDM with PAD and painful callus(es) b/l feet and painful mycotic toenails b/l that are difficult to trim. Pain interferes with ambulation. Aggravating factors include wearing enclosed shoe gear. Pain is relieved with periodic professional debridement.   Pt also has h/o GBS. He voices no new pedal problems on today's visit.  Doree Albee, MD is patient's PCP.  Last visit was 12/20/2019.  Past Medical History:  Diagnosis Date  . AF (atrial fibrillation) ([place])   . Atrial flutter ([place])   . Blue toes    chronic  . Essential hypertension, benign 04/23/2019  . GERD (gastroesophageal reflux disease)   . Gout   . Guillain Barr syndrome ([place])   . Type II diabetes mellitus, uncontrolled ([place]) 04/23/2019     No current facility-administered medications on file prior to visit.   Current Outpatient Medications on File Prior to Visit  Medication Sig Dispense Refill  . aspirin EC 81 MG tablet Take 1 tablet (81 mg total) by mouth daily. 90 tablet 3  . glipiZIDE (GLUCOTROL) 5 MG tablet TAKE 1 TABLET DAILY. 90 tablet 0  . glucose blood test strip Use to check your blood glucose once a day. 100 each 3  . metFORMIN (GLUCOPHAGE-XR) 500 MG 24 hr tablet TAKE (1) TABLET TWICE DAILY. (Patient taking differently: Take 500 mg by mouth 2 (two) times daily. ) 60 tablet 3  . metoprolol tartrate (LOPRESSOR) 50 MG tablet Take 1 tablet (50 mg total) by mouth 2 (two) times daily. 180 tablet 3  . pravastatin (PRAVACHOL) 20 MG tablet Take 1 tablet (20 mg total) by mouth daily. 90 tablet 3  . prednisoLONE acetate (PRED FORTE) 1 % ophthalmic suspension     . [DISCONTINUED] glipiZIDE (GLUCOTROL) 5 MG tablet Take 5 mg by mouth daily before breakfast.    . [DISCONTINUED] glipiZIDE (GLUCOTROL) 5 MG tablet TAKE 1 TABLET ONCE DAILY. 30 tablet 3  . [DISCONTINUED] glipiZIDE (GLUCOTROL) 5 MG tablet TAKE 1 TABLET DAILY. 30 tablet 3  .  [DISCONTINUED] metFORMIN (GLUCOPHAGE XR) 500 MG 24 hr tablet Take 1 tablet (500 mg total) by mouth 2 (two) times a day. 60 tablet 1  . [DISCONTINUED] metFORMIN (GLUCOPHAGE-XR) 500 MG 24 hr tablet TAKE (1) TABLET TWICE DAILY. 60 tablet 1     Allergies  Allergen Reactions  . Influenza Vaccines Other (See Comments)    Guillain-Barre syndrome    Objective: Marcus [name] is a pleasant 74 y.o. y.o. Patient Race: White or Caucasian [1]  male in NAD. AAO x 3.  There were no vitals filed for this visit.  Vascular Examination: Neurovascular status unchanged b/l lower extremities. Capillary refill time to digits <4 seconds b/l. Nonpalpable DP pulse(s) right foot. Nonpalpable PT pulse(s) left foot. DP pulse palpable left foot. PT pulse palpable right foot. Pedal hair absent b/l. Skin temperature gradient warm to cool b/l. Cyanotic appearance of digits 1-5 when feet are in dependent position. Pallor noted with LE elevation b/l. No pain with calf compression b/l. +1 pitting edema b/l lower extremities. Evidence of chronic venous insufficiency b/l LE.  Dermatological Examination: Pedal skin is thin shiny, atrophic bilaterally. No open wounds bilaterally. No interdigital macerations bilaterally. Toenails 1-5 b/l elongated, discolored, dystrophic, thickened, crumbly with subungual debris and tenderness to dorsal palpation. Hyperkeratotic lesion(s) submet head 1 left foot, submet head 1 right foot and submet head 5 right foot.  No erythema, no edema, no drainage, no flocculence.  Musculoskeletal: Normal muscle strength 5/5 to all lower extremity muscle groups bilaterally. No pain crepitus or joint limitation noted with ROM b/l. Hallux valgus with bunion deformity noted b/l lower extremities. Hammertoes noted to the L 5th toe and R 5th toe.  Neurological Examination: Protective sensation intact 5/5 intact bilaterally with 10g monofilament b/l. Vibratory sensation intact b/l. Proprioception intact  bilaterally.  Assessment: 1. Pain due to onychomycosis of toenails of both feet   2. Callus   3. Type II diabetes mellitus with peripheral circulatory disorder (HCC)   4. Hallux valgus, acquired, bilateral   5. GBS (Guillain Barre syndrome) ([place])     Plan: -Examined patient. -No new findings. No new orders. -Continue diabetic foot care principles. -Toenails 1-5 b/l were debrided in length and girth with sterile nail nippers and dremel without iatrogenic bleeding.  -Callus(es) submet head 1 right foot, submet head 5 left foot and submet head 5 right foot pared utilizing sterile scalpel blade without complication or incident. Total number debrided =3. -Patient to report any pedal injuries to medical professional immediately. -Patient to continue soft, supportive shoe gear daily. Start procedure for diabetic shoes. Patient qualifies based on diagnoses. -Patient/POA to call should there be question/concern in the interim.  Return in about 9 weeks (around 05/05/2020) for nail and callus trim.  Marzetta Board, DPM

## 2020-03-04 NOTE — Plan of Care (Signed)

## 2020-03-04 NOTE — Progress Notes (Signed)
PROGRESS NOTE    Marcus [name]  VWU:[id number] DOB: Mar 06, 1946 DOA: 03/03/2020 PCP: Doree Albee, MD   Chief Complaint  Patient presents with  . Altered Mental Status    Brief Narrative:  As per H&P written By Dr. 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  Also has A. fib with RVR possibly secondary to medication noncompliance.  Patient is in denial about his symptoms.  Assessment & Plan: 1-COVID-19 virus infection -Bilateral atelectatic changes appreciated on x-ray, oxygen saturation 90% on room air (< 94%, cut off by protocol) on presentation and with elevated inflammatory markers. -Continue remdesivir and steroids -Supportive care, antitussive medication and as needed bronchodilators. -Continue vitamin C and zinc -Fluid resuscitation and supportive care to be provided. -Follow clinical response. -patient is unvaccinated.  2-Atrial fibrillation with RVR ([place]) -In the setting of not taking his medications properly at home -Chronically not on anticoagulation -CHADSVASC score 1 -Continue chronic aspirin. -Adjusted dose of metoprolol has been given with good response to his rate control. -No requiring IV Cardizem at this time. -Continue monitor on telemetry and follow electrolytes abilities.  3-prior history of GBS (Guillain Barre syndrome) ([place]) -Patient reports on residual tingling sensation/numbness on his fingertips and lower extremities bilaterally. -Will check B12 and TSH.  4-Type II diabetes mellitus, uncontrolled ([place]) -Presented with hyperglycemia -Will check A1c -Continue sliding scale insulin and long-acting insulin, while inpatient hold oral hypoglycemic agents.  5-Essential hypertension, benign -Stable overall. -Heart healthy diet has been ordered -Continue current antihypertensive  regimen and follow vital signs.  6-Peripheral vascular disease ([place]) -No cyanosis or clubbing on examination. -Plan continue aspirin and statins.  7-Acute metabolic encephalopathy -Negative CT head -With concerns for mostly hypoxia and dehydration from uncontrolled blood sugar levels responsible for his acute change in mentation. -Continue treatment for acute Covid infection, control blood sugars maintain adequate hydration. -Follow clinical response.  8-acute on chronic renal failure -Patient with a stage III 8 at baseline -In the setting of continued use of nephrotoxic agents and dehydration -Minimize the use of nephrotoxic agents-continue adequate IV fluid resuscitation and oral hydration. -Follow-up renal function trend.   DVT prophylaxis: Lovenox Code Status: Full code. Family Communication: Wife updated over the phone 03/04/2020 Disposition:   Status is: Inpatient  Dispo: The patient is from: Home              Anticipated d/c is to: Home              Anticipated d/c date is: 1-2 days (if patient remains stable and outpatient infusion therapy can be arranged).              Patient currently  No medically stable for discharge currently; expressing no feeling good, requiring IV therapy for remdesivir and steroids.  Continue adjusting medications to control his heart rate and CBGs.       Consultants:  None    Procedures:  See below for x-ray reports.   Antimicrobials:  Remdesivir   Subjective: Afebrile, oriented x3, expressing short winded sensation with activity and intermittent coughing spells.  No requiring oxygen supplementation while resting.  No fever, no nausea, no vomiting, no chest pain.  Objective: Vitals:   03/04/20 0330 03/04/20 0400 03/04/20 0430 03/04/20 0500  BP: 110/81 99/82 107/89 116/74  Pulse: 62 (!) 46 89 69  Resp: 18 19 19 20   Temp:  TempSrc:      SpO2: 95% 96% 95% 95%  Weight:      Height:        Intake/Output Summary (Last 24  hours) at 03/04/2020 0944 Last data filed at 03/04/2020 0554 Gross per 24 hour  Intake --  Output 750 ml  Net -750 ml   Filed Weights   03/03/20 1811  Weight: 86.2 kg    Examination:  General exam: Appears calm and comfortable.  In no acute distress.  Denies chest pain.  No nausea, no vomiting. Respiratory system: No wheezing, positive scattered rhonchi bilaterally.   Cardiovascular system: S1 & S2 heard, RRR. No JVD, murmurs, rubs, gallops or clicks. No pedal edema. Gastrointestinal system: Abdomen is nondistended, soft and nontender. No organomegaly or masses felt. Normal bowel sounds heard. Central nervous system: Alert and oriented. No focal neurological deficits. Extremities: No cyanosis, no clubbing, no edema. Skin: No rashes, lesions or ulcers Psychiatry: Judgement and insight appear normal. Mood & affect appropriate.     Data Reviewed: I have personally reviewed following labs and imaging studies  CBC: Recent Labs  Lab 03/03/20 1846 03/04/20 0535  WBC 4.2 2.9*  NEUTROABS 2.7 1.7  HGB 17.2* 15.0  HCT 52.4* 46.7  MCV 92.1 94.7  PLT 141* 124*    Basic Metabolic Panel: Recent Labs  Lab 03/03/20 1846 03/04/20 0535  NA 137 139  K 4.2 5.3*  CL 104 107  CO2 20* 23  GLUCOSE 161* 237*  BUN 43* 39*  CREATININE 1.96* 1.46*  CALCIUM 8.7* 8.4*    GFR: Estimated Creatinine Clearance: 50 mL/min (A) (by C-G formula based on SCr of 1.46 mg/dL (H)).  Liver Function Tests: Recent Labs  Lab 03/03/20 1846 03/04/20 0535  AST 64* 49*  ALT 57* 44  ALKPHOS 71 59  BILITOT 0.9 0.8  PROT 7.5 6.2*  ALBUMIN 3.9 3.1*    CBG: Recent Labs  Lab 03/03/20 2212 03/04/20 0746  GLUCAP 208* 214*     Recent Results (from the past 240 hour(s))  SARS Coronavirus 2 by RT PCR (hospital order, performed in [facility] hospital lab) Nasopharyngeal Urine, Catheterized     Status: Abnormal   Collection Time: 03/03/20  6:27 PM   Specimen: Urine, Catheterized; Nasopharyngeal   Result Value Ref Range Status   SARS Coronavirus 2 POSITIVE (A) NEGATIVE Final    Comment: RESULT CALLED TO, READ BACK BY AND VERIFIED WITH: L ANDREW,RN@0752  03/03/20 MKELLY (NOTE) SARS-CoV-2 target nucleic acids are DETECTED  SARS-CoV-2 RNA is generally detectable in upper respiratory specimens  during the acute phase of infection.  Positive results are indicative  of the presence of the identified virus, but do not rule out bacterial infection or co-infection with other pathogens not detected by the test.  Clinical correlation with patient history and  other diagnostic information is necessary to determine patient infection status.  The expected result is negative.  Fact Sheet for Patients:   StrictlyIdeas.no   Fact Sheet for Healthcare Providers:   BankingDealers.co.za    This test is not yet approved or cleared by the [place] FDA and  has been authorized for detection and/or diagnosis of SARS-CoV-2 by FDA under an Emergency Use Authorization (EUA).  This EUA will remain in effect (meaning this test  can be used) for the duration of  the COVID-19 declaration under Section 564(b)(1) of the Act, 21 U.S.C. section 360-bbb-3(b)(1), unless the authorization is terminated or revoked sooner.  Performed at [facility], [address], [place],  [place] [address]   Blood Culture (routine x 2)     Status: None (Preliminary result)   Collection Time: 03/03/20  6:47 PM   Specimen: Left Antecubital; Blood  Result Value Ref Range Status   Specimen Description LEFT ANTECUBITAL  Final   Special Requests   Final    BOTTLES DRAWN AEROBIC AND ANAEROBIC Blood Culture adequate volume   Culture   Final    NO GROWTH < 24 HOURS Performed at [facility], [address], [place], [place] [address]    Report Status PENDING  Incomplete  Blood Culture (routine x 2)     Status: None (Preliminary result)   Collection Time: 03/03/20  6:47 PM    Specimen: Left Antecubital; Blood  Result Value Ref Range Status   Specimen Description LEFT ANTECUBITAL  Final   Special Requests   Final    BOTTLES DRAWN AEROBIC AND ANAEROBIC Blood Culture adequate volume   Culture   Final    NO GROWTH < 24 HOURS Performed at [facility], [address], [place], [place] [address]    Report Status PENDING  Incomplete         Radiology Studies: CT Head Wo Contrast  Result Date: 03/03/2020 CLINICAL DATA:  74 year old male with altered mental status, found driving abnormally. EXAM: CT HEAD WITHOUT CONTRAST TECHNIQUE: Contiguous axial images were obtained from the base of the skull through the vertex without intravenous contrast. COMPARISON:  Head CT 07/21/2013. FINDINGS: Brain: Cerebral volume is not significantly changed since 2014 and seems within normal limits for age. No midline shift, ventriculomegaly, mass effect, evidence of mass lesion, intracranial hemorrhage or evidence of cortically based acute infarction. Gray-white matter differentiation is within normal limits throughout the brain. No encephalomalacia identified. Vascular: Mild Calcified atherosclerosis at the skull base. No suspicious intracranial vascular hyperdensity. Skull: No acute osseous abnormality identified. Sinuses/Orbits: Visualized paranasal sinuses and mastoids are stable and well pneumatized. Other: No acute orbit or scalp soft tissue finding. IMPRESSION: No acute intracranial abnormality. Normal for age non contrast CT appearance of the brain. Electronically Signed   By: Genevie Ann M.D.   On: 03/03/2020 20:56   DG Chest Port 1 View  Result Date: 03/03/2020 CLINICAL DATA:  74 year old male with sepsis. EXAM: PORTABLE CHEST 1 VIEW COMPARISON:  None. FINDINGS: Minimal left lung base atelectasis. Mild cardiomegaly. No congestion or edema. No focal consolidation, pleural effusion, or pneumothorax. Atherosclerotic calcification of the aortic arch. No acute osseous pathology. IMPRESSION: No  focal consolidation. Electronically Signed   By: Anner Crete M.D.   On: 03/03/2020 18:47        Scheduled Meds: . aspirin EC  81 mg Oral Daily  . dexamethasone (DECADRON) injection  6 mg Intravenous Q24H  . enoxaparin (LOVENOX) injection  40 mg Subcutaneous Q24H  . insulin aspart  0-9 Units Subcutaneous TID WC  . metoprolol tartrate  75 mg Oral BID  . pravastatin  20 mg Oral Daily  . sodium chloride flush  3 mL Intravenous Q12H   Continuous Infusions: . sodium chloride    . lactated ringers 100 mL/hr at 03/03/20 2258  . remdesivir 100 mg in NS 100 mL       LOS: 1 day    Time spent: 35 minutes.    Barton Dubois, MD [facility]   To contact the attending provider between 7A-7P or the covering provider during after hours 7P-7A, please log into the web site [email or web address] and access using universal [place] password for that web  site. If you do not have the password, please call the hospital operator.  03/04/2020, 9:44 AM

## 2020-03-05 DIAGNOSIS — G9341 Metabolic encephalopathy: Secondary | ICD-10-CM | POA: Diagnosis not present

## 2020-03-05 DIAGNOSIS — N179 Acute kidney failure, unspecified: Secondary | ICD-10-CM | POA: Diagnosis not present

## 2020-03-05 DIAGNOSIS — I4891 Unspecified atrial fibrillation: Secondary | ICD-10-CM | POA: Diagnosis not present

## 2020-03-05 DIAGNOSIS — U071 COVID-19: Secondary | ICD-10-CM | POA: Diagnosis not present

## 2020-03-05 LAB — CBC WITH DIFFERENTIAL/PLATELET
Abs Immature Granulocytes: 0.04 10*3/uL (ref 0.00–0.07)
Basophils Absolute: 0 10*3/uL (ref 0.0–0.1)
Basophils Relative: 0 %
Eosinophils Absolute: 0 10*3/uL (ref 0.0–0.5)
Eosinophils Relative: 0 %
HCT: 45.2 % (ref 39.0–52.0)
Hemoglobin: 14.5 g/dL (ref 13.0–17.0)
Immature Granulocytes: 1 %
Lymphocytes Relative: 14 %
Lymphs Abs: 0.9 10*3/uL (ref 0.7–4.0)
MCH: 30.2 pg (ref 26.0–34.0)
MCHC: 32.1 g/dL (ref 30.0–36.0)
MCV: 94.2 fL (ref 80.0–100.0)
Monocytes Absolute: 0.4 10*3/uL (ref 0.1–1.0)
Monocytes Relative: 6 %
Neutro Abs: 5.2 10*3/uL (ref 1.7–7.7)
Neutrophils Relative %: 79 %
Platelets: 141 10*3/uL — ABNORMAL LOW (ref 150–400)
RBC: 4.8 MIL/uL (ref 4.22–5.81)
RDW: 13.8 % (ref 11.5–15.5)
WBC: 6.5 10*3/uL (ref 4.0–10.5)
nRBC: 0 % (ref 0.0–0.2)

## 2020-03-05 LAB — COMPREHENSIVE METABOLIC PANEL
ALT: 39 U/L (ref 0–44)
AST: 43 U/L — ABNORMAL HIGH (ref 15–41)
Albumin: 2.9 g/dL — ABNORMAL LOW (ref 3.5–5.0)
Alkaline Phosphatase: 52 U/L (ref 38–126)
Anion gap: 13 (ref 5–15)
BUN: 42 mg/dL — ABNORMAL HIGH (ref 8–23)
CO2: 20 mmol/L — ABNORMAL LOW (ref 22–32)
Calcium: 8.1 mg/dL — ABNORMAL LOW (ref 8.9–10.3)
Chloride: 106 mmol/L (ref 98–111)
Creatinine, Ser: 1.36 mg/dL — ABNORMAL HIGH (ref 0.61–1.24)
GFR calc Af Amer: 58 mL/min — ABNORMAL LOW (ref >60)
GFR calc non Af Amer: 50 mL/min — ABNORMAL LOW (ref >60)
Glucose, Bld: 222 mg/dL — ABNORMAL HIGH (ref 70–99)
Potassium: 4 mmol/L (ref 3.5–5.1)
Sodium: 139 mmol/L (ref 135–145)
Total Bilirubin: 0.6 mg/dL (ref 0.3–1.2)
Total Protein: 5.6 g/dL — ABNORMAL LOW (ref 6.5–8.1)

## 2020-03-05 LAB — C-REACTIVE PROTEIN: CRP: 4.1 mg/dL — ABNORMAL HIGH (ref <1.0)

## 2020-03-05 LAB — PROCALCITONIN: Procalcitonin: 0.56 ng/mL

## 2020-03-05 LAB — D-DIMER, QUANTITATIVE: D-Dimer, Quant: 1.71 ug/mL-FEU — ABNORMAL HIGH (ref 0.00–0.50)

## 2020-03-05 LAB — GLUCOSE, CAPILLARY
Glucose-Capillary: 201 mg/dL — ABNORMAL HIGH (ref 70–99)
Glucose-Capillary: 266 mg/dL — ABNORMAL HIGH (ref 70–99)

## 2020-03-05 MED ORDER — METOPROLOL TARTRATE 75 MG PO TABS: 75.0000 mg | ORAL_TABLET | Freq: Two times a day (BID) | ORAL | 1 refills | Status: DC

## 2020-03-05 MED ORDER — VITAMIN B-12 100 MCG PO TABS: 500.0000 ug | ORAL_TABLET | Freq: Every day | ORAL | Status: DC

## 2020-03-05 MED ORDER — CYANOCOBALAMIN 500 MCG PO TABS: 500.0000 ug | ORAL_TABLET | Freq: Every day | ORAL | 1 refills | Status: AC

## 2020-03-05 MED ORDER — DEXAMETHASONE 4 MG PO TABS
6.0000 mg | ORAL_TABLET | Freq: Every day | ORAL | Status: DC
  Administered 2020-03-05: 6 mg via ORAL
  Filled 2020-03-05: qty 2

## 2020-03-05 MED ORDER — CYANOCOBALAMIN 1000 MCG/ML IJ SOLN
1000.0000 ug | Freq: Once | INTRAMUSCULAR | Status: AC
  Administered 2020-03-05: 1000 ug via INTRAMUSCULAR
  Filled 2020-03-05: qty 1

## 2020-03-05 MED ORDER — DEXAMETHASONE 6 MG PO TABS: 6.0000 mg | ORAL_TABLET | Freq: Every day | ORAL | 0 refills | Status: DC

## 2020-03-05 NOTE — Clinical Social Work Note (Signed)
Patient and wife advised of Remdesivir OP Infusion appointments and that transportation has been scheduled.      Cheila Wickstrom, Clydene Pugh, LCSW

## 2020-03-05 NOTE — Discharge Instructions (Signed)
Patient scheduled for outpatient Remdesivir infusions at 3:30pm on Thursday 8/12 and Friday 8/13 at [facility].   Transportation has been set up. Please inform them to drive to the main entrance of [address] [facility], stay in the car and call [phone] as staff will come out to escort the patient through the hospital. The appointments will take approximately 1 hour, you may bring a drink, snack, and light coat if needed. Thanks.

## 2020-03-05 NOTE — Progress Notes (Signed)
Patient scheduled for outpatient Remdesivir infusions at 3:30pm on Thursday 8/12 and Friday 8/13 at [facility].   Transportation has been set up. Please inform them to drive to the main entrance of [facility], stay in the car and call [phone] as staff will come out to escort the patient through the hospital. The appointments will take approximately 1 hour, you may bring a drink, snack, and light coat if needed. Thanks.

## 2020-03-05 NOTE — Discharge Summary (Signed)
Physician Discharge Summary  Marcus [name] BZJ:[id number] DOB: [birth year] DOA: 03/03/2020  PCP: Doree Albee, MD  Admit date: 03/03/2020 Discharge date: 03/05/2020  Admitted From: home Disposition:  Home   Recommendations for Outpatient Follow-up:  1. Follow up with PCP in 1-2 weeks 2. Please obtain BMP/CBC in one week    Discharge Condition: Stable CODE STATUS: FULL Diet recommendation: Heart Healthy / Carb Modified   Brief/Interim Summary: Marcus [name] is an 74 y.o. 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  Patient himself states he does not know why he is here.  He stated he feels perfectly fine.   Notes that he was stopped by the sheriff but again does not know why he was stopped by the sheriff.  He states that although he has not spoken with his wife he knows that the sheriff called his wife about him.   After speaking with patient's wife Marcus [name], a different story emerges.  According to her, for the past week, patient has been profoundly fatigued with shortness of breath and DOE.  He apparently has not been able to walk around the house like he normally does.  This morning, he apparently could not climb the 4 steps into the house because he was so tired and short of breath.  She also notes that  he has not "been comprehending, he has not been hearing me."  She apparently would tell him things and he would not remember what she just told him.  She has also noticed that he has not been taking his medications properly. She has noted that his HR has been much higher than usual, notes his heart rate runs in the 90s and last week his heart rate was in the 120s to 130s.  She also notes that his sugars have been running much higher, 280 on last check where his sugars are usually under better control.  She thinks that he has  not been taking his medications properly.  Marcus [name] states that she has been trying to get him to come to the hospital for the past week however he has said that he has been feeling fine even though she knows that he is not.  She has been worried that he has Covid and he has adamantly refused and stated that he did not have Covid.  When she was called by the sheriff's office earlier today, she thought this was her opportunity to get into the hospital.  ED Course:  The patient was noted to be febrile and to be tachycardic to 144 on arrival.  He was relatively normotensive with O2 saturations 90% on room air.  Laboratory data was notable for acute kidney injury with a creatinine of 1.9, lactate 2.1 and a negative chest x-ray.  Patient was treated with fluid resuscitation and started on broad-spectrum antibiotics for sepsis protocol including vancomycin, cefepime and Flagyl.  Covid test subsequently came back positive.  Inflammatory markers and procalcitonin are still pending.  Discharge Diagnoses:   1-COVID-19 virus infection -Bilateral atelectatic changes appreciated on x-ray, oxygen saturation 90% on room air (< 94%, cut off by protocol) on presentation and with elevated inflammatory markers. -stable on RA at time of d/c -Continue remdesivir and steroids -Supportive care, antitussive medication and as needed bronchodilators. -Continued vitamin C and zinc -Fluid resuscitation and supportive care to be provided. -Follow  clinical response. -patient is unvaccinated. -had 3 days of remdesivir during hospitalization -pt will be setup for 2 days of remdesivir transfusion on 8/12 and 8/13 after d/c -d/c home with dexamethasone x 7 more days  2-Atrial fibrillation with RVR ([place]) -Chronically not on anticoagulation -CHADSVASC score 1 -Continue chronic aspirin. -Adjusted dose of metoprolol has been given with good response to his rate control. -d/c home with metoprolol 75 mg bid -Not requiring  IV Cardizem at this time. -Continue monitor on telemetry and follow electrolytes abilities.  3-prior history of GBS (Guillain Barre syndrome) ([place]) -Patient reports on residual tingling sensation/numbness on his fingertips and lower extremities bilaterally. -B12--116-->replete -TSH--2.307  4-Type II diabetes mellitus, uncontrolled ([place]) -Presented with hyperglycemia -03/03/20 check A1c--7.3 -Continue sliding scale insulin and long-acting insulin, while inpatient hold oral hypoglycemic agents.  5-Essential hypertension, benign -Stable overall. -Heart healthy diet has been ordered -Continue current antihypertensive regimen and follow vital signs.  6-Peripheral vascular disease ([place]) -No cyanosis or clubbing on examination. -Plan continue aspirin and statins.  7-Acute metabolic encephalopathy -Negative CT head -due t0 COVID-19 infection, AKI, and low B12 -Continue treatment for acute Covid infection, control blood sugars maintain adequate hydration. -supplement B12  8-acute on chronic renal failure -Patient with a stage IIIa at baseline--1.1-1.3 -serum creatinine peaked 1.96 -Minimize the use of nephrotoxic agents-continue adequate IV fluid resuscitation and oral hydration. -serum creatinine 1.36 on day of dc  Discharge Instructions   Allergies as of 03/05/2020      Reactions   Influenza Vaccines Other (See Comments)   Guillain-Barre syndrome      Medication List    TAKE these medications   aspirin EC 81 MG tablet Take 1 tablet (81 mg total) by mouth daily.   dexamethasone 6 MG tablet Commonly known as: DECADRON Take 1 tablet (6 mg total) by mouth daily.   glipiZIDE 5 MG tablet Commonly known as: GLUCOTROL TAKE 1 TABLET DAILY.   glucose blood test strip Use to check your blood glucose once a day.   metFORMIN 500 MG 24 hr tablet Commonly known as: GLUCOPHAGE-XR TAKE (1) TABLET TWICE DAILY. What changed: See the new instructions.   Metoprolol Tartrate  75 MG Tabs Take 75 mg by mouth 2 (two) times daily. What changed:   medication strength  how much to take   pravastatin 20 MG tablet Commonly known as: PRAVACHOL Take 1 tablet (20 mg total) by mouth daily.   prednisoLONE acetate 1 % ophthalmic suspension Commonly known as: PRED FORTE   vitamin B-12 500 MCG tablet Commonly known as: CYANOCOBALAMIN Take 1 tablet (500 mcg total) by mouth daily. Start taking on: March 06, 2020       Allergies  Allergen Reactions  . Influenza Vaccines Other (See Comments)    Guillain-Barre syndrome    Consultations:  none   Procedures/Studies: CT Head Wo Contrast  Result Date: 03/03/2020 CLINICAL DATA:  74 year old male with altered mental status, found driving abnormally. EXAM: CT HEAD WITHOUT CONTRAST TECHNIQUE: Contiguous axial images were obtained from the base of the skull through the vertex without intravenous contrast. COMPARISON:  Head CT 07/21/2013. FINDINGS: Brain: Cerebral volume is not significantly changed since 2014 and seems within normal limits for age. No midline shift, ventriculomegaly, mass effect, evidence of mass lesion, intracranial hemorrhage or evidence of cortically based acute infarction. Gray-white matter differentiation is within normal limits throughout the brain. No encephalomalacia identified. Vascular: Mild Calcified atherosclerosis at the skull base. No suspicious intracranial vascular hyperdensity. Skull: No acute osseous abnormality identified.  Sinuses/Orbits: Visualized paranasal sinuses and mastoids are stable and well pneumatized. Other: No acute orbit or scalp soft tissue finding. IMPRESSION: No acute intracranial abnormality. Normal for age non contrast CT appearance of the brain. Electronically Signed   By: Genevie Ann M.D.   On: 03/03/2020 20:56   DG Chest Port 1 View  Result Date: 03/03/2020 CLINICAL DATA:  74 year old male with sepsis. EXAM: PORTABLE CHEST 1 VIEW COMPARISON:  None. FINDINGS: Minimal left lung  base atelectasis. Mild cardiomegaly. No congestion or edema. No focal consolidation, pleural effusion, or pneumothorax. Atherosclerotic calcification of the aortic arch. No acute osseous pathology. IMPRESSION: No focal consolidation. Electronically Signed   By: Anner Crete M.D.   On: 03/03/2020 18:47        Discharge Exam: Vitals:   03/04/20 2224 03/05/20 0555  BP: 128/85 (!) 161/97  Pulse: 83 80  Resp: 16 18  Temp: 98.4 F (36.9 C) 97.6 F (36.4 C)  SpO2: 93% 97%   Vitals:   03/04/20 1845 03/04/20 2140 03/04/20 2224 03/05/20 0555  BP: (!) 123/97  128/85 (!) 161/97  Pulse: (!) 106  83 80  Resp: 18  16 18   Temp: 98.2 F (36.8 C)  98.4 F (36.9 C) 97.6 F (36.4 C)  TempSrc: Oral  Oral Oral  SpO2: 97% 93% 93% 97%  Weight:      Height:        General: Pt is alert, awake, not in acute distress Cardiovascular: RRR, S1/S2 +, no rubs, no gallops Respiratory: bibasilar crackles. No wheeze Abdominal: Soft, NT, ND, bowel sounds + Extremities: no edema, no cyanosis   The results of significant diagnostics from this hospitalization (including imaging, microbiology, ancillary and laboratory) are listed below for reference.    Significant Diagnostic Studies: CT Head Wo Contrast  Result Date: 03/03/2020 CLINICAL DATA:  74 year old male with altered mental status, found driving abnormally. EXAM: CT HEAD WITHOUT CONTRAST TECHNIQUE: Contiguous axial images were obtained from the base of the skull through the vertex without intravenous contrast. COMPARISON:  Head CT 07/21/2013. FINDINGS: Brain: Cerebral volume is not significantly changed since 2014 and seems within normal limits for age. No midline shift, ventriculomegaly, mass effect, evidence of mass lesion, intracranial hemorrhage or evidence of cortically based acute infarction. Gray-white matter differentiation is within normal limits throughout the brain. No encephalomalacia identified. Vascular: Mild Calcified atherosclerosis at  the skull base. No suspicious intracranial vascular hyperdensity. Skull: No acute osseous abnormality identified. Sinuses/Orbits: Visualized paranasal sinuses and mastoids are stable and well pneumatized. Other: No acute orbit or scalp soft tissue finding. IMPRESSION: No acute intracranial abnormality. Normal for age non contrast CT appearance of the brain. Electronically Signed   By: Genevie Ann M.D.   On: 03/03/2020 20:56   DG Chest Port 1 View  Result Date: 03/03/2020 CLINICAL DATA:  74 year old male with sepsis. EXAM: PORTABLE CHEST 1 VIEW COMPARISON:  None. FINDINGS: Minimal left lung base atelectasis. Mild cardiomegaly. No congestion or edema. No focal consolidation, pleural effusion, or pneumothorax. Atherosclerotic calcification of the aortic arch. No acute osseous pathology. IMPRESSION: No focal consolidation. Electronically Signed   By: Anner Crete M.D.   On: 03/03/2020 18:47     Microbiology: Recent Results (from the past 240 hour(s))  Urine culture     Status: None   Collection Time: 03/03/20  6:24 PM   Specimen: In/Out Cath Urine  Result Value Ref Range Status   Specimen Description   Final    IN/OUT CATH URINE Performed at [facility], [address] [place], [place] [address]    Special Requests   Final    NONE Performed at [facility], [address] [place], [place] [address]    Culture   Final    NO GROWTH Performed at [facility], [place] [address], [place], [place] [address]    Report Status 03/04/2020 FINAL  Final  SARS Coronavirus 2 by RT PCR (hospital order, performed in [facility] hospital lab) Nasopharyngeal Urine, Catheterized     Status: Abnormal   Collection Time: 03/03/20  6:27 PM   Specimen: Urine, Catheterized; Nasopharyngeal  Result Value Ref Range Status   SARS Coronavirus 2 POSITIVE (A) NEGATIVE Final    Comment: RESULT CALLED TO, READ BACK BY AND VERIFIED WITH: L ANDREW,RN@0752  03/03/20 MKELLY (NOTE) SARS-CoV-2 target nucleic acids are  DETECTED  SARS-CoV-2 RNA is generally detectable in upper respiratory specimens  during the acute phase of infection.  Positive results are indicative  of the presence of the identified virus, but do not rule out bacterial infection or co-infection with other pathogens not detected by the test.  Clinical correlation with patient history and  other diagnostic information is necessary to determine patient infection status.  The expected result is negative.  Fact Sheet for Patients:   StrictlyIdeas.no   Fact Sheet for Healthcare Providers:   BankingDealers.co.za    This test is not yet approved or cleared by the [place] FDA and  has been authorized for detection and/or diagnosis of SARS-CoV-2 by FDA under an Emergency Use Authorization (EUA).  This EUA will remain in effect (meaning this test  can be used) for the duration of  the COVID-19 declaration under Section 564(b)(1) of the Act, 21 U.S.C. section 360-bbb-3(b)(1), unless the authorization is terminated or revoked sooner.  Performed at [facility], [address] [place], [place] [address]   Blood Culture (routine x 2)     Status: None (Preliminary result)   Collection Time: 03/03/20  6:47 PM   Specimen: Left Antecubital; Blood  Result Value Ref Range Status   Specimen Description LEFT ANTECUBITAL  Final   Special Requests   Final    BOTTLES DRAWN AEROBIC AND ANAEROBIC Blood Culture adequate volume   Culture   Final    NO GROWTH 2 DAYS Performed at [facility], [address] [place], [place] [address]    Report Status PENDING  Incomplete  Blood Culture (routine x 2)     Status: None (Preliminary result)   Collection Time: 03/03/20  6:47 PM   Specimen: Left Antecubital; Blood  Result Value Ref Range Status   Specimen Description LEFT ANTECUBITAL  Final   Special Requests   Final    BOTTLES DRAWN AEROBIC AND ANAEROBIC Blood Culture adequate volume   Culture   Final      NO GROWTH 2 DAYS Performed at [facility], [address], [place], [place] [address]    Report Status PENDING  Incomplete     Labs: Basic Metabolic Panel: Recent Labs  Lab 03/03/20 1846 03/03/20 1846 03/04/20 0535 03/05/20 0620  NA 137  --  139 139  K 4.2   < > 5.3* 4.0  CL 104  --  107 106  CO2 20*  --  23 20*  GLUCOSE 161*  --  237* 222*  BUN 43*  --  39* 42*  CREATININE 1.96*  --  1.46* 1.36*  CALCIUM 8.7*  --  8.4* 8.1*   < > = values in this  interval not displayed.   Liver Function Tests: Recent Labs  Lab 03/03/20 1846 03/04/20 0535 03/05/20 0620  AST 64* 49* 43*  ALT 57* 44 39  ALKPHOS 71 59 52  BILITOT 0.9 0.8 0.6  PROT 7.5 6.2* 5.6*  ALBUMIN 3.9 3.1* 2.9*   No results for input(s): LIPASE, AMYLASE in the last 168 hours. No results for input(s): AMMONIA in the last 168 hours. CBC: Recent Labs  Lab 03/03/20 1846 03/04/20 0535 03/05/20 0620  WBC 4.2 2.9* 6.5  NEUTROABS 2.7 1.7 5.2  HGB 17.2* 15.0 14.5  HCT 52.4* 46.7 45.2  MCV 92.1 94.7 94.2  PLT 141* 124* 141*   Cardiac Enzymes: No results for input(s): CKTOTAL, CKMB, CKMBINDEX, TROPONINI in the last 168 hours. BNP: Invalid input(s): POCBNP CBG: Recent Labs  Lab 03/04/20 1307 03/04/20 1740 03/04/20 2224 03/05/20 0735 03/05/20 1124  GLUCAP 227* 211* 200* 201* 266*    Time coordinating discharge:  36 minutes  Signed:  Orson Eva, DO [facility] Pager: [phone] 03/05/2020, 1:26 PM

## 2020-03-05 NOTE — Plan of Care (Signed)
  Problem: Education: Goal: Knowledge of General Education information will improve Description: Including pain rating scale, medication(s)/side effects and non-pharmacologic comfort measures 03/05/2020 1142 by Melony Overly, RN Outcome: Progressing 03/05/2020 1142 by Melony Overly, RN Outcome: Progressing   Problem: Health Behavior/Discharge Planning: Goal: Ability to manage health-related needs will improve 03/05/2020 1142 by Melony Overly, RN Outcome: Progressing 03/05/2020 1142 by Melony Overly, RN Outcome: Progressing   Problem: Clinical Measurements: Goal: Ability to maintain clinical measurements within normal limits will improve 03/05/2020 1142 by Melony Overly, RN Outcome: Progressing 03/05/2020 1142 by Melony Overly, RN Outcome: Progressing Goal: Will remain free from infection 03/05/2020 1142 by Melony Overly, RN Outcome: Progressing 03/05/2020 1142 by Melony Overly, RN Outcome: Progressing Goal: Diagnostic test results will improve 03/05/2020 1142 by Melony Overly, RN Outcome: Progressing 03/05/2020 1142 by Melony Overly, RN Outcome: Progressing Goal: Respiratory complications will improve 03/05/2020 1142 by Melony Overly, RN Outcome: Progressing 03/05/2020 1142 by Melony Overly, RN Outcome: Progressing Goal: Cardiovascular complication will be avoided 03/05/2020 1142 by Melony Overly, RN Outcome: Progressing 03/05/2020 1142 by Melony Overly, RN Outcome: Progressing   Problem: Activity: Goal: Risk for activity intolerance will decrease Outcome: Progressing   Problem: Nutrition: Goal: Adequate nutrition will be maintained Outcome: Progressing   Problem: Coping: Goal: Level of anxiety will decrease Outcome: Progressing   Problem: Elimination: Goal: Will not experience complications related to bowel motility Outcome: Progressing Goal: Will not experience complications related to urinary retention Outcome: Progressing   Problem: Pain  Managment: Goal: General experience of comfort will improve Outcome: Progressing   Problem: Safety: Goal: Ability to remain free from injury will improve Outcome: Progressing   Problem: Skin Integrity: Goal: Risk for impaired skin integrity will decrease Outcome: Progressing

## 2020-03-05 NOTE — Plan of Care (Signed)
  Problem: Education: Goal: Knowledge of General Education information will improve Description: Including pain rating scale, medication(s)/side effects and non-pharmacologic comfort measures 03/05/2020 1416 by Melony Overly, RN Outcome: Adequate for Discharge 03/05/2020 1142 by Melony Overly, RN Outcome: Progressing   Problem: Health Behavior/Discharge Planning: Goal: Ability to manage health-related needs will improve 03/05/2020 1416 by Melony Overly, RN Outcome: Adequate for Discharge 03/05/2020 1142 by Melony Overly, RN Outcome: Progressing   Problem: Clinical Measurements: Goal: Ability to maintain clinical measurements within normal limits will improve 03/05/2020 1416 by Melony Overly, RN Outcome: Adequate for Discharge 03/05/2020 1142 by Melony Overly, RN Outcome: Progressing Goal: Will remain free from infection 03/05/2020 1416 by Melony Overly, RN Outcome: Adequate for Discharge 03/05/2020 1142 by Melony Overly, RN Outcome: Progressing Goal: Diagnostic test results will improve 03/05/2020 1416 by Melony Overly, RN Outcome: Adequate for Discharge 03/05/2020 1142 by Melony Overly, RN Outcome: Progressing Goal: Respiratory complications will improve 03/05/2020 1416 by Melony Overly, RN Outcome: Adequate for Discharge 03/05/2020 1142 by Melony Overly, RN Outcome: Progressing Goal: Cardiovascular complication will be avoided 03/05/2020 1416 by Melony Overly, RN Outcome: Adequate for Discharge 03/05/2020 1142 by Melony Overly, RN Outcome: Progressing   Problem: Activity: Goal: Risk for activity intolerance will decrease 03/05/2020 1416 by Melony Overly, RN Outcome: Adequate for Discharge 03/05/2020 1142 by Melony Overly, RN Outcome: Progressing   Problem: Nutrition: Goal: Adequate nutrition will be maintained 03/05/2020 1416 by Melony Overly, RN Outcome: Adequate for Discharge 03/05/2020 1142 by Melony Overly, RN Outcome: Progressing    Problem: Coping: Goal: Level of anxiety will decrease 03/05/2020 1416 by Melony Overly, RN Outcome: Adequate for Discharge 03/05/2020 1142 by Melony Overly, RN Outcome: Progressing   Problem: Elimination: Goal: Will not experience complications related to bowel motility 03/05/2020 1416 by Melony Overly, RN Outcome: Adequate for Discharge 03/05/2020 1142 by Melony Overly, RN Outcome: Progressing Goal: Will not experience complications related to urinary retention 03/05/2020 1416 by Melony Overly, RN Outcome: Adequate for Discharge 03/05/2020 1142 by Melony Overly, RN Outcome: Progressing   Problem: Pain Managment: Goal: General experience of comfort will improve 03/05/2020 1416 by Melony Overly, RN Outcome: Adequate for Discharge 03/05/2020 1142 by Melony Overly, RN Outcome: Progressing   Problem: Safety: Goal: Ability to remain free from injury will improve 03/05/2020 1416 by Melony Overly, RN Outcome: Adequate for Discharge 03/05/2020 1142 by Melony Overly, RN Outcome: Progressing   Problem: Skin Integrity: Goal: Risk for impaired skin integrity will decrease 03/05/2020 1416 by Melony Overly, RN Outcome: Adequate for Discharge 03/05/2020 1142 by Melony Overly, RN Outcome: Progressing

## 2020-03-06 ENCOUNTER — Ambulatory Visit (HOSPITAL_COMMUNITY)
Admit: 2020-03-06 | Discharge: 2020-03-06 | Disposition: A | Discharge status: Home / Self Care | Payer: Medicare Other | Attending: Pulmonary Disease | Admitting: Pulmonary Disease

## 2020-03-06 DIAGNOSIS — J1282 Pneumonia due to coronavirus disease 2019: Secondary | ICD-10-CM | POA: Diagnosis not present

## 2020-03-06 DIAGNOSIS — U071 COVID-19: Secondary | ICD-10-CM | POA: Diagnosis not present

## 2020-03-06 MED ORDER — SODIUM CHLORIDE 0.9 % IV SOLN: INTRAVENOUS | Status: DC | PRN

## 2020-03-06 MED ORDER — ALBUTEROL SULFATE HFA 108 (90 BASE) MCG/ACT IN AERS: 2.0000 | INHALATION_SPRAY | Freq: Once | RESPIRATORY_TRACT | Status: DC | PRN

## 2020-03-06 MED ORDER — METHYLPREDNISOLONE SODIUM SUCC 125 MG IJ SOLR: 125.0000 mg | Freq: Once | INTRAMUSCULAR | Status: DC | PRN

## 2020-03-06 MED ORDER — FAMOTIDINE IN NACL 20-0.9 MG/50ML-% IV SOLN: 20.0000 mg | Freq: Once | INTRAVENOUS | Status: DC | PRN

## 2020-03-06 MED ORDER — DIPHENHYDRAMINE HCL 50 MG/ML IJ SOLN: 50.0000 mg | Freq: Once | INTRAMUSCULAR | Status: DC | PRN

## 2020-03-06 MED ORDER — SODIUM CHLORIDE 0.9 % IV SOLN
100.0000 mg | Freq: Once | INTRAVENOUS | Status: AC
  Administered 2020-03-06: 100 mg via INTRAVENOUS
  Filled 2020-03-06: qty 20

## 2020-03-06 MED ORDER — EPINEPHRINE 0.3 MG/0.3ML IJ SOAJ: 0.3000 mg | Freq: Once | INTRAMUSCULAR | Status: DC | PRN

## 2020-03-06 NOTE — Progress Notes (Signed)
  Diagnosis: COVID-19  Physician:  Procedure: [facility] Med: remdesivir infusion - Provided patient with remdesivir fact sheet for patients, parents and caregivers prior to infusion.  Complications: No immediate complications noted.  Discharge: Discharged home   Abran Cantor 03/06/2020

## 2020-03-06 NOTE — Discharge Instructions (Signed)
10 Things You Can Do to Manage Your COVID-19 Symptoms at Home If you have possible or confirmed COVID-19: 1. Stay home from work and school. And stay away from other public places. If you must go out, avoid using any kind of public transportation, ridesharing, or taxis. 2. Monitor your symptoms carefully. If your symptoms get worse, call your healthcare provider immediately. 3. Get rest and stay hydrated. 4. If you have a medical appointment, call the healthcare provider ahead of time and tell them that you have or may have COVID-19. 5. For medical emergencies, call 911 and notify the dispatch personnel that you have or may have COVID-19. 6. Cover your cough and sneezes with a tissue or use the inside of your elbow. 7. Wash your hands often with soap and water for at least 20 seconds or clean your hands with an alcohol-based hand sanitizer that contains at least 60% alcohol. 8. As much as possible, stay in a specific room and away from other people in your home. Also, you should use a separate bathroom, if available. If you need to be around other people in or outside of the home, wear a mask. 9. Avoid sharing personal items with other people in your household, like dishes, towels, and bedding. 10. Clean all surfaces that are touched often, like counters, tabletops, and doorknobs. Use household cleaning sprays or wipes according to the label instructions. cdc.gov/coronavirus 01/24/2019 This information is not intended to replace advice given to you by your health care provider. Make sure you discuss any questions you have with your health care provider. Document Revised: 06/28/2019 Document Reviewed: 06/28/2019 Elsevier Patient Education  2020 Elsevier Inc.  

## 2020-03-07 ENCOUNTER — Ambulatory Visit (HOSPITAL_COMMUNITY)
Admit: 2020-03-07 | Discharge: 2020-03-07 | Disposition: A | Discharge status: Home / Self Care | Payer: Medicare Other | Source: Ambulatory Visit | Attending: Pulmonary Disease | Admitting: Pulmonary Disease

## 2020-03-07 DIAGNOSIS — U071 COVID-19: Secondary | ICD-10-CM | POA: Insufficient documentation

## 2020-03-07 DIAGNOSIS — J1289 Other viral pneumonia: Secondary | ICD-10-CM | POA: Diagnosis not present

## 2020-03-07 MED ORDER — EPINEPHRINE 0.3 MG/0.3ML IJ SOAJ: 0.3000 mg | Freq: Once | INTRAMUSCULAR | Status: DC | PRN

## 2020-03-07 MED ORDER — SODIUM CHLORIDE 0.9 % IV SOLN
100.0000 mg | Freq: Once | INTRAVENOUS | Status: AC
  Administered 2020-03-07: 100 mg via INTRAVENOUS
  Filled 2020-03-07: qty 20

## 2020-03-07 MED ORDER — SODIUM CHLORIDE 0.9 % IV SOLN: INTRAVENOUS | Status: DC | PRN

## 2020-03-07 MED ORDER — ALBUTEROL SULFATE HFA 108 (90 BASE) MCG/ACT IN AERS: 2.0000 | INHALATION_SPRAY | Freq: Once | RESPIRATORY_TRACT | Status: DC | PRN

## 2020-03-07 MED ORDER — FAMOTIDINE IN NACL 20-0.9 MG/50ML-% IV SOLN: 20.0000 mg | Freq: Once | INTRAVENOUS | Status: DC | PRN

## 2020-03-07 MED ORDER — DIPHENHYDRAMINE HCL 50 MG/ML IJ SOLN: 50.0000 mg | Freq: Once | INTRAMUSCULAR | Status: DC | PRN

## 2020-03-07 MED ORDER — METHYLPREDNISOLONE SODIUM SUCC 125 MG IJ SOLR: 125.0000 mg | Freq: Once | INTRAMUSCULAR | Status: DC | PRN

## 2020-03-07 NOTE — Progress Notes (Signed)
  Diagnosis: COVID-19  Physician: Dr. Joya Gaskins  Procedure: [facility] Infusion Clinic Med: remdesivir infusion - Provided patient with remdesivir fact sheet for patients, parents and caregivers prior to infusion.  Complications: No immediate complications noted.  Discharge: Discharged home   [place] 03/07/2020

## 2020-03-08 LAB — CULTURE, BLOOD (ROUTINE X 2)
Culture: NO GROWTH
Culture: NO GROWTH
Special Requests: ADEQUATE
Special Requests: ADEQUATE

## 2020-03-10 ENCOUNTER — Telehealth (INDEPENDENT_AMBULATORY_CARE_PROVIDER_SITE_OTHER): Payer: Self-pay | Admitting: *Deleted

## 2020-03-10 NOTE — Telephone Encounter (Signed)
Referring MD/PCP: gosrani   Procedure: tcs  Reason/Indication:  Hx polyps, fam hx colon ca  Has patient had this procedure before?  Yes, 2016  If so, when, by whom and where?    Is there a family history of colon cancer?  Yes, mother  Who?  What age when diagnosed?    Is patient diabetic?   yes      Does patient have prosthetic heart valve or mechanical valve?  no  Do you have a pacemaker/defibrillator?  no  Has patient ever had endocarditis/atrial fibrillation? yes  Does patient use oxygen? no  Has patient had joint replacement within last 12 months?  no  Is patient constipated or do they take laxatives? no  Does patient have a history of alcohol/drug use?  no  Is patient on blood thinner such as Coumadin, Plavix and/or Aspirin? yes  Medications: asa 81 mg daily, glipizide 5 mg daily, metformin 500 mg bid, metoprolol 50 mg bid, pravastatin 20 mg daily,   Allergies: nkda  Medication Adjustment per Dr Rehman/Dr Jenetta Downer asa 2 days, hold diabetice meds evening before and morning of  Procedure date & time: 04/10/20 at 730

## 2020-03-11 ENCOUNTER — Telehealth (INDEPENDENT_AMBULATORY_CARE_PROVIDER_SITE_OTHER): Payer: Medicare Other | Admitting: Nurse Practitioner

## 2020-03-11 VITALS — BP 112/79 | HR 103 | Temp 98.3°F

## 2020-03-11 DIAGNOSIS — U071 COVID-19: Secondary | ICD-10-CM | POA: Diagnosis not present

## 2020-03-11 NOTE — Progress Notes (Signed)
Due to national recommendations of social distancing related to the [place] pandemic, an audio-only tele-health visit was felt to be the most appropriate encounter type for this patient today. I connected with  Marcus [name] and his wife Marcus [name] who assisted with the visit on 03/11/20 utilizing audio-only technology and verified that I am speaking with the correct person using two identifiers. The patient was located at their home, and I was located at the office of [facility] during the encounter. I discussed the limitations of evaluation and management by telemedicine. The patient expressed understanding and agreed to proceed.    Subjective:  Patient ID: Marcus [name], male    DOB: 1946/05/01  Age: 74 y.o. MRN: [id number]  CC:  Chief Complaint  Patient presents with  . Covid Exposure    diagnosed 03/03/2020  . no appetite  . Fatigue      HPI  This patient arrives today with assistance from his wife for a virtual visit for the above.  She tells me that over the last 4 days the patient has been feeling very fatigued and has not been eating or drinking as much as she feels he should be.  He was diagnosed with SARS-CoV-2 back on 03/03/2020 and was hospitalized until 03/05/20 for sepsis related to SARS-CoV-2.  Prior to the discharge he was stable on room air and he did receive treatment with remdesivir as well as infusions at the infusion center in [place].  According to the wife he seemed to be feeling better initially after the infusions however over the weekend started to have reduced energy and seems to not want to eat or do much during the day.  She tells me that he has been drinking water, but she does not feel like he is drinking enough.  She herself is also sick with SARS-CoV-2 infection and is not sure if he has been taking his medications as prescribed.  She tells me he has not been vomiting or nauseated.  She also tells me he has not been  experiencing significant shortness of breath or high fevers.  She does mention that he seems to not be acting 100% like himself.  Upon further questioning she tells me that his cognition seems to be similar to that when he was first admitted to the hospital on the ninth.  At that time she felt that he was fairly confused.  She tells me he is quite responsive and is easy to arouse, he is able to stay awake during conversations and interact fairly appropriately.  She does have equipment at home to monitor his vital signs.  Past Medical History:  Diagnosis Date  . AF (atrial fibrillation) ([place])   . Atrial flutter ([place])   . Blue toes    chronic  . Essential hypertension, benign 04/23/2019  . GERD (gastroesophageal reflux disease)   . Gout   . Guillain Barr syndrome ([place])   . Type II diabetes mellitus, uncontrolled ([place]) 04/23/2019      Family History  Problem Relation Age of Onset  . Diabetes Father   . Colon cancer Father   . Colon cancer Mother     Social History   Social History Narrative   Married for 70 years,lives with wife.Retired.   Social History   Tobacco Use  . Smoking status: Former Smoker    Packs/day: 2.00    Years: 5.00    Pack years: 10.00    Types: Cigarettes    Start  date: 11/23/2016    Quit date: 11/24/2016    Years since quitting: 3.2  . Smokeless tobacco: Never Used  Substance Use Topics  . Alcohol use: No     Current Meds  Medication Sig  . aspirin EC 81 MG tablet Take 1 tablet (81 mg total) by mouth daily.  Marland Kitchen dexamethasone (DECADRON) 6 MG tablet Take 1 tablet (6 mg total) by mouth daily.  Marland Kitchen glipiZIDE (GLUCOTROL) 5 MG tablet TAKE 1 TABLET DAILY.  Marland Kitchen glucose blood test strip Use to check your blood glucose once a day.  . metFORMIN (GLUCOPHAGE-XR) 500 MG 24 hr tablet TAKE (1) TABLET TWICE DAILY. (Patient taking differently: Take 500 mg by mouth 2 (two) times daily. )  . metoprolol tartrate 75 MG TABS Take 75 mg by mouth 2 (two) times daily.  .  pravastatin (PRAVACHOL) 20 MG tablet Take 1 tablet (20 mg total) by mouth daily.  . vitamin B-12 (CYANOCOBALAMIN) 500 MCG tablet Take 1 tablet (500 mcg total) by mouth daily.    ROS:  Review of Systems  Constitutional: Positive for malaise/fatigue. Negative for fever.  Respiratory: Negative for shortness of breath.   Cardiovascular: Negative for chest pain.  Neurological: Negative for dizziness and headaches.  Psychiatric/Behavioral: Negative for hallucinations.     Objective:   Today's Vitals: BP 112/79   Pulse (!) 103   Temp 98.3 F (36.8 C)   SpO2 92% Comment: on RA Vitals with BMI 03/11/2020 03/07/2020 03/07/2020  Height - - -  Weight - - -  BMI - - -  Systolic [phone]  Diastolic 79 287 97  Pulse 103 77 62     Physical Exam Comprehensive physical exam not conducted today as office visit was conducted over the phone.  I did speak to the patient briefly and he sounded fairly well.  He was able to answer questions, he did not sound like he was breathing heavily or hard, I did not hear a cough.  He was alert and oriented to himself, location, and situation.   Assessment and Plan   1. COVID-19 virus infection      Plan: 1.  I think his symptoms are most likely related to his recent diagnosis of COVID-19 virus infection.  Currently vital signs appear fairly stable.  I did discuss the situation with my supervising physician (Dr. Anastasio Champion), and we have agreed that initial plan will be for patient to focus on fluid intake and eat when he is willing and able to.  I also discussed emergency parameters with the wife so that she knows when to call 911.  I have recommended that she continue to check his blood pressure and if she see the systolic blood pressure less than 100, heart rate greater than 115, changes in his cognition (sleepiness, confusion, etc.), changes in his respiratory status such as breathing fast, heavy, or inability to complete full sentences due to shortness of  breath, etc. she is to call 911.  She tells me she understands.  We will have Dr. Anastasio Champion follow-up with them next week via telemetry visit for close monitoring.  She was encouraged to call us with any questions between now and then.   Tests ordered No orders of the defined types were placed in this encounter.     No orders of the defined types were placed in this encounter.   Patient to follow-up in 1 week, this telemetry visit conversation lasted for over 18 minutes  Ailene Ards, NP

## 2020-03-12 DIAGNOSIS — U071 COVID-19: Secondary | ICD-10-CM | POA: Diagnosis not present

## 2020-03-12 DIAGNOSIS — W19XXXA Unspecified fall, initial encounter: Secondary | ICD-10-CM | POA: Diagnosis not present

## 2020-03-12 DIAGNOSIS — R4020 Unspecified coma: Secondary | ICD-10-CM | POA: Diagnosis not present

## 2020-03-13 ENCOUNTER — Emergency Department (HOSPITAL_COMMUNITY): Payer: Medicare Other

## 2020-03-13 ENCOUNTER — Inpatient Hospital Stay (HOSPITAL_COMMUNITY)
Admission: EM | Admit: 2020-03-13 | Discharge: 2020-03-17 | DRG: 177 | Disposition: A | Discharge status: Skilled Nursing Facility | Payer: Medicare Other | Attending: Family Medicine | Admitting: Family Medicine

## 2020-03-13 DIAGNOSIS — J9601 Acute respiratory failure with hypoxia: Secondary | ICD-10-CM | POA: Diagnosis present

## 2020-03-13 DIAGNOSIS — Z9842 Cataract extraction status, left eye: Secondary | ICD-10-CM

## 2020-03-13 DIAGNOSIS — E1165 Type 2 diabetes mellitus with hyperglycemia: Secondary | ICD-10-CM | POA: Diagnosis not present

## 2020-03-13 DIAGNOSIS — Y92239 Unspecified place in hospital as the place of occurrence of the external cause: Secondary | ICD-10-CM | POA: Diagnosis not present

## 2020-03-13 DIAGNOSIS — Z7984 Long term (current) use of oral hypoglycemic drugs: Secondary | ICD-10-CM | POA: Diagnosis not present

## 2020-03-13 DIAGNOSIS — IMO0002 Reserved for concepts with insufficient information to code with codable children: Secondary | ICD-10-CM | POA: Diagnosis present

## 2020-03-13 DIAGNOSIS — W19XXXA Unspecified fall, initial encounter: Secondary | ICD-10-CM | POA: Diagnosis present

## 2020-03-13 DIAGNOSIS — M6281 Muscle weakness (generalized): Secondary | ICD-10-CM | POA: Diagnosis not present

## 2020-03-13 DIAGNOSIS — J1281 Pneumonia due to SARS-associated coronavirus: Secondary | ICD-10-CM | POA: Diagnosis not present

## 2020-03-13 DIAGNOSIS — R29898 Other symptoms and signs involving the musculoskeletal system: Secondary | ICD-10-CM | POA: Diagnosis present

## 2020-03-13 DIAGNOSIS — E440 Moderate protein-calorie malnutrition: Secondary | ICD-10-CM | POA: Diagnosis present

## 2020-03-13 DIAGNOSIS — Z7982 Long term (current) use of aspirin: Secondary | ICD-10-CM

## 2020-03-13 DIAGNOSIS — R531 Weakness: Secondary | ICD-10-CM | POA: Diagnosis not present

## 2020-03-13 DIAGNOSIS — Z86010 Personal history of colonic polyps: Secondary | ICD-10-CM

## 2020-03-13 DIAGNOSIS — T380X5A Adverse effect of glucocorticoids and synthetic analogues, initial encounter: Secondary | ICD-10-CM | POA: Diagnosis not present

## 2020-03-13 DIAGNOSIS — R5381 Other malaise: Secondary | ICD-10-CM | POA: Diagnosis not present

## 2020-03-13 DIAGNOSIS — M1A9XX0 Chronic gout, unspecified, without tophus (tophi): Secondary | ICD-10-CM | POA: Diagnosis not present

## 2020-03-13 DIAGNOSIS — Z833 Family history of diabetes mellitus: Secondary | ICD-10-CM | POA: Diagnosis not present

## 2020-03-13 DIAGNOSIS — J449 Chronic obstructive pulmonary disease, unspecified: Secondary | ICD-10-CM | POA: Diagnosis not present

## 2020-03-13 DIAGNOSIS — I739 Peripheral vascular disease, unspecified: Secondary | ICD-10-CM | POA: Diagnosis present

## 2020-03-13 DIAGNOSIS — R509 Fever, unspecified: Secondary | ICD-10-CM | POA: Diagnosis not present

## 2020-03-13 DIAGNOSIS — R000 Tachycardia, unspecified: Secondary | ICD-10-CM | POA: Diagnosis not present

## 2020-03-13 DIAGNOSIS — E1151 Type 2 diabetes mellitus with diabetic peripheral angiopathy without gangrene: Secondary | ICD-10-CM | POA: Diagnosis present

## 2020-03-13 DIAGNOSIS — Z79899 Other long term (current) drug therapy: Secondary | ICD-10-CM | POA: Diagnosis not present

## 2020-03-13 DIAGNOSIS — J9811 Atelectasis: Secondary | ICD-10-CM | POA: Diagnosis not present

## 2020-03-13 DIAGNOSIS — Y92009 Unspecified place in unspecified non-institutional (private) residence as the place of occurrence of the external cause: Secondary | ICD-10-CM

## 2020-03-13 DIAGNOSIS — M109 Gout, unspecified: Secondary | ICD-10-CM | POA: Diagnosis present

## 2020-03-13 DIAGNOSIS — I482 Chronic atrial fibrillation, unspecified: Secondary | ICD-10-CM | POA: Diagnosis present

## 2020-03-13 DIAGNOSIS — R238 Other skin changes: Secondary | ICD-10-CM

## 2020-03-13 DIAGNOSIS — Z9181 History of falling: Secondary | ICD-10-CM

## 2020-03-13 DIAGNOSIS — Z800 Family history of malignant neoplasm of digestive organs: Secondary | ICD-10-CM

## 2020-03-13 DIAGNOSIS — Z9841 Cataract extraction status, right eye: Secondary | ICD-10-CM

## 2020-03-13 DIAGNOSIS — Z87891 Personal history of nicotine dependence: Secondary | ICD-10-CM

## 2020-03-13 DIAGNOSIS — R2681 Unsteadiness on feet: Secondary | ICD-10-CM | POA: Diagnosis not present

## 2020-03-13 DIAGNOSIS — K219 Gastro-esophageal reflux disease without esophagitis: Secondary | ICD-10-CM | POA: Diagnosis present

## 2020-03-13 DIAGNOSIS — U071 COVID-19: Secondary | ICD-10-CM | POA: Diagnosis not present

## 2020-03-13 DIAGNOSIS — I2510 Atherosclerotic heart disease of native coronary artery without angina pectoris: Secondary | ICD-10-CM | POA: Diagnosis not present

## 2020-03-13 DIAGNOSIS — R230 Cyanosis: Secondary | ICD-10-CM | POA: Diagnosis present

## 2020-03-13 DIAGNOSIS — R296 Repeated falls: Secondary | ICD-10-CM | POA: Diagnosis present

## 2020-03-13 DIAGNOSIS — I4891 Unspecified atrial fibrillation: Secondary | ICD-10-CM | POA: Diagnosis not present

## 2020-03-13 DIAGNOSIS — N179 Acute kidney failure, unspecified: Secondary | ICD-10-CM | POA: Diagnosis present

## 2020-03-13 DIAGNOSIS — S0081XA Abrasion of other part of head, initial encounter: Secondary | ICD-10-CM | POA: Diagnosis present

## 2020-03-13 DIAGNOSIS — M6259 Muscle wasting and atrophy, not elsewhere classified, multiple sites: Secondary | ICD-10-CM | POA: Diagnosis not present

## 2020-03-13 DIAGNOSIS — I10 Essential (primary) hypertension: Secondary | ICD-10-CM | POA: Diagnosis present

## 2020-03-13 DIAGNOSIS — I700 Atherosclerosis of aorta: Secondary | ICD-10-CM | POA: Diagnosis not present

## 2020-03-13 DIAGNOSIS — Z7401 Bed confinement status: Secondary | ICD-10-CM | POA: Diagnosis not present

## 2020-03-13 DIAGNOSIS — J189 Pneumonia, unspecified organism: Secondary | ICD-10-CM | POA: Diagnosis not present

## 2020-03-13 DIAGNOSIS — R2689 Other abnormalities of gait and mobility: Secondary | ICD-10-CM | POA: Diagnosis not present

## 2020-03-13 DIAGNOSIS — J1282 Pneumonia due to coronavirus disease 2019: Secondary | ICD-10-CM | POA: Diagnosis present

## 2020-03-13 DIAGNOSIS — R41841 Cognitive communication deficit: Secondary | ICD-10-CM | POA: Diagnosis not present

## 2020-03-13 DIAGNOSIS — E1169 Type 2 diabetes mellitus with other specified complication: Secondary | ICD-10-CM | POA: Diagnosis not present

## 2020-03-13 DIAGNOSIS — G610 Guillain-Barre syndrome: Secondary | ICD-10-CM | POA: Diagnosis not present

## 2020-03-13 LAB — CBC WITH DIFFERENTIAL/PLATELET
Abs Immature Granulocytes: 0.04 10*3/uL (ref 0.00–0.07)
Basophils Absolute: 0 10*3/uL (ref 0.0–0.1)
Basophils Relative: 0 %
Eosinophils Absolute: 0 10*3/uL (ref 0.0–0.5)
Eosinophils Relative: 0 %
HCT: 51 % (ref 39.0–52.0)
Hemoglobin: 16.7 g/dL (ref 13.0–17.0)
Immature Granulocytes: 0 %
Lymphocytes Relative: 6 %
Lymphs Abs: 0.6 10*3/uL — ABNORMAL LOW (ref 0.7–4.0)
MCH: 30.3 pg (ref 26.0–34.0)
MCHC: 32.7 g/dL (ref 30.0–36.0)
MCV: 92.6 fL (ref 80.0–100.0)
Monocytes Absolute: 0.3 10*3/uL (ref 0.1–1.0)
Monocytes Relative: 4 %
Neutro Abs: 7.9 10*3/uL — ABNORMAL HIGH (ref 1.7–7.7)
Neutrophils Relative %: 90 %
Platelets: 195 10*3/uL (ref 150–400)
RBC: 5.51 MIL/uL (ref 4.22–5.81)
RDW: 13.8 % (ref 11.5–15.5)
WBC: 8.9 10*3/uL (ref 4.0–10.5)
nRBC: 0 % (ref 0.0–0.2)

## 2020-03-13 LAB — COMPREHENSIVE METABOLIC PANEL
ALT: 36 U/L (ref 0–44)
AST: 38 U/L (ref 15–41)
Albumin: 2.5 g/dL — ABNORMAL LOW (ref 3.5–5.0)
Alkaline Phosphatase: 64 U/L (ref 38–126)
Anion gap: 11 (ref 5–15)
BUN: 47 mg/dL — ABNORMAL HIGH (ref 8–23)
CO2: 19 mmol/L — ABNORMAL LOW (ref 22–32)
Calcium: 7.9 mg/dL — ABNORMAL LOW (ref 8.9–10.3)
Chloride: 103 mmol/L (ref 98–111)
Creatinine, Ser: 1.57 mg/dL — ABNORMAL HIGH (ref 0.61–1.24)
GFR calc Af Amer: 49 mL/min — ABNORMAL LOW (ref >60)
GFR calc non Af Amer: 42 mL/min — ABNORMAL LOW (ref >60)
Glucose, Bld: 231 mg/dL — ABNORMAL HIGH (ref 70–99)
Potassium: 4.3 mmol/L (ref 3.5–5.1)
Sodium: 133 mmol/L — ABNORMAL LOW (ref 135–145)
Total Bilirubin: 1.4 mg/dL — ABNORMAL HIGH (ref 0.3–1.2)
Total Protein: 5.9 g/dL — ABNORMAL LOW (ref 6.5–8.1)

## 2020-03-13 LAB — PROTIME-INR
INR: 1.3 — ABNORMAL HIGH (ref 0.8–1.2)
Prothrombin Time: 15.5 seconds — ABNORMAL HIGH (ref 11.4–15.2)

## 2020-03-13 LAB — TRIGLYCERIDES: Triglycerides: 135 mg/dL (ref <150)

## 2020-03-13 LAB — D-DIMER, QUANTITATIVE: D-Dimer, Quant: 3.59 ug/mL-FEU — ABNORMAL HIGH (ref 0.00–0.50)

## 2020-03-13 LAB — URINALYSIS, ROUTINE W REFLEX MICROSCOPIC
Bacteria, UA: NONE SEEN
Bilirubin Urine: NEGATIVE
Glucose, UA: 50 mg/dL — AB
Ketones, ur: 5 mg/dL — AB
Leukocytes,Ua: NEGATIVE
Nitrite: NEGATIVE
Protein, ur: 30 mg/dL — AB
Specific Gravity, Urine: 1.021 (ref 1.005–1.030)
pH: 5 (ref 5.0–8.0)

## 2020-03-13 LAB — C-REACTIVE PROTEIN: CRP: 19.7 mg/dL — ABNORMAL HIGH (ref <1.0)

## 2020-03-13 LAB — PROCALCITONIN: Procalcitonin: 1.46 ng/mL

## 2020-03-13 LAB — FERRITIN: Ferritin: 3299 ng/mL — ABNORMAL HIGH (ref 24–336)

## 2020-03-13 LAB — LACTATE DEHYDROGENASE: LDH: 186 U/L (ref 98–192)

## 2020-03-13 LAB — LACTIC ACID, PLASMA
Lactic Acid, Venous: 1.4 mmol/L (ref 0.5–1.9)
Lactic Acid, Venous: 1.9 mmol/L (ref 0.5–1.9)

## 2020-03-13 LAB — FIBRINOGEN: Fibrinogen: 716 mg/dL — ABNORMAL HIGH (ref 210–475)

## 2020-03-13 LAB — CBG MONITORING, ED: Glucose-Capillary: 209 mg/dL — ABNORMAL HIGH (ref 70–99)

## 2020-03-13 MED ORDER — ENSURE ENLIVE PO LIQD
237.0000 mL | Freq: Two times a day (BID) | ORAL | Status: DC
  Administered 2020-03-14 – 2020-03-16 (×5): 237 mL via ORAL
  Filled 2020-03-13 (×2): qty 237

## 2020-03-13 MED ORDER — SODIUM CHLORIDE 0.9 % IV SOLN
1000.0000 mL | INTRAVENOUS | Status: DC
  Administered 2020-03-13 – 2020-03-16 (×6): 1000 mL via INTRAVENOUS

## 2020-03-13 MED ORDER — INSULIN ASPART 100 UNIT/ML ~~LOC~~ SOLN
0.0000 [IU] | Freq: Three times a day (TID) | SUBCUTANEOUS | Status: DC
  Administered 2020-03-14: 20 [IU] via SUBCUTANEOUS
  Administered 2020-03-14: 4 [IU] via SUBCUTANEOUS
  Administered 2020-03-14: 11 [IU] via SUBCUTANEOUS
  Administered 2020-03-15: 4 [IU] via SUBCUTANEOUS
  Administered 2020-03-15: 11 [IU] via SUBCUTANEOUS
  Administered 2020-03-16: 7 [IU] via SUBCUTANEOUS
  Administered 2020-03-16: 15 [IU] via SUBCUTANEOUS
  Administered 2020-03-17: 3 [IU] via SUBCUTANEOUS
  Administered 2020-03-17: 15 [IU] via SUBCUTANEOUS

## 2020-03-13 MED ORDER — SODIUM CHLORIDE 0.9 % IV SOLN
1.0000 g | INTRAVENOUS | Status: DC
  Administered 2020-03-14 – 2020-03-16 (×3): 1 g via INTRAVENOUS
  Filled 2020-03-13 (×3): qty 10

## 2020-03-13 MED ORDER — INSULIN ASPART 100 UNIT/ML ~~LOC~~ SOLN
0.0000 [IU] | Freq: Every day | SUBCUTANEOUS | Status: DC
  Administered 2020-03-13 – 2020-03-15 (×2): 2 [IU] via SUBCUTANEOUS
  Filled 2020-03-13: qty 1

## 2020-03-13 MED ORDER — SODIUM CHLORIDE 0.9 % IV SOLN
500.0000 mg | INTRAVENOUS | Status: DC
  Administered 2020-03-13 – 2020-03-15 (×3): 500 mg via INTRAVENOUS
  Filled 2020-03-13 (×3): qty 500

## 2020-03-13 MED ORDER — ACETAMINOPHEN 325 MG PO TABS
650.0000 mg | ORAL_TABLET | Freq: Once | ORAL | Status: AC
  Administered 2020-03-13: 650 mg via ORAL
  Filled 2020-03-13: qty 2

## 2020-03-13 MED ORDER — IOHEXOL 350 MG/ML SOLN
75.0000 mL | Freq: Once | INTRAVENOUS | Status: AC | PRN
  Administered 2020-03-13: 75 mL via INTRAVENOUS

## 2020-03-13 MED ORDER — SODIUM CHLORIDE 0.9 % IV SOLN
1.0000 g | Freq: Once | INTRAVENOUS | Status: AC
  Administered 2020-03-13: 1 g via INTRAVENOUS
  Filled 2020-03-13: qty 10

## 2020-03-13 NOTE — ED Notes (Signed)
Dr Earnest Conroy at bedside for admission

## 2020-03-13 NOTE — H&P (Signed)
TRH H&P    Patient Demographics:    Marcus [name], is a 74 y.o. male  MRN: [id number]  DOB - [birth year]  Admit Date - 03/13/2020  Referring MD/NP/PA: Dr. Hillard Danker  Outpatient Primary MD for the patient is Doree Albee, MD  Patient coming from: Home  Chief complaint- Generalized weakness   HPI:    Marcus [name]  is a 74 y.o. male, 3 of type 2 diabetes mellitus, Guillain-Barr syndrome, gout, GERD, essential hypertension, blue toes, A. fib/a flutter, presents to the ER with a chief complaint of weakness and poor p.o. intake.  Patient reports that he was recently discharged after hospitalization with Covid.  He has been weaker and weaker at home.  In the middle of the night he got up to go to the bathroom, and fell down.  Patient reports that he did not trip he did not have chest pain he did not have palpitations he did not have shortness of breath, but he just went down.  Patient reports that he remained on the floor until his wife woke up and found him, and called EMS.  Patient thinks that he did hit his head during his fall but he did not have loss of consciousness.  He has had no headache, no change in vision or hearing.  Patient does have an abrasion on his forehead.  Patient reports that he has not been eating at home, and he cannot remember when his last normal meal was.  He denies constipation, diarrhea, abdominal pain.  He admits to some nausea, but no vomiting.  Patient reports that he has not felt short of breath.  He does not have oxygen at home.  The symptoms have been ongoing since March 05, 2020-his last discharge.  Patient remains somewhat confused, although he is alert and oriented x3.  Patient reports he is not sure if he has had a cough.  He also reports that he is only here because his wife made him come.  Although he cannot get up, or walk, he does not think he needs to be in the hospital.  This  shows little insight into his own healthcare.  In the ED Temperature 101.7, heart rate 108, respiratory rate 20, blood pressure 137/87, 88% on room air improving to 96% on 2 L nasal cannula No leukocytosis with a white blood cell count of 8.9 Basic CHEM shows a BUN of 47, creatinine 1.57, glucose 231, otherwise unremarkable LDH is 186, triglycerides 135, lactic acid 1.4, pro-Cal 1.46, D-dimer 3.59, fibrinogen 716, INR 1.3, blood cultures pending Chest x-ray shows mild multifocal infiltrates right greater than left CTA shows no pulmonary embolus     Review of systems:    Review of Systems  Constitutional: Positive for malaise/fatigue. Negative for chills and fever.  HENT: Negative for congestion, ear pain, sore throat and tinnitus.   Eyes: Negative for blurred vision, double vision and photophobia.  Respiratory: Negative for cough, hemoptysis, sputum production and shortness of breath.   Cardiovascular: Negative for chest pain, palpitations and leg swelling.  Gastrointestinal:  Positive for nausea. Negative for abdominal pain, constipation, diarrhea and vomiting.  Genitourinary: Negative for dysuria, frequency and urgency.  Musculoskeletal: Positive for falls. Negative for myalgias.  Skin:       Chronic cyanosis of toes BL   Neurological: Positive for weakness. Negative for dizziness, sensory change, focal weakness and loss of consciousness.    All other systems reviewed and are negative.    Past History of the following :    Past Medical History:  Diagnosis Date  . AF (atrial fibrillation) ([place])   . Atrial flutter ([place])   . Blue toes    chronic  . Essential hypertension, benign 04/23/2019  . GERD (gastroesophageal reflux disease)   . Gout   . Guillain Barr syndrome ([place])   . Type II diabetes mellitus, uncontrolled ([place]) 04/23/2019      Past Surgical History:  Procedure Laterality Date  . catheter ablation  12/23/2004  . COLONOSCOPY N/A 09/18/2014   Procedure:  COLONOSCOPY;  Surgeon: Rogene Houston, MD;  [facility];  Service: Endoscopy;  Laterality: N/A;  830  . EYE SURGERY  2020   Bilateral cataract surgery  . FEMUR IM NAIL Right 06/25/2013   Procedure: INTRAMEDULLARY (IM) RETROGRADE FEMORAL NAILING;  Surgeon: Marybelle Killings, MD;  Location: [facility];  Service: Orthopedics;  Laterality: Right;      Social History:      Social History   Tobacco Use  . Smoking status: Former Smoker    Packs/day: 2.00    Years: 5.00    Pack years: 10.00    Types: Cigarettes    Start date: 11/23/2016    Quit date: 11/24/2016    Years since quitting: 3.3  . Smokeless tobacco: Never Used  Substance Use Topics  . Alcohol use: No       Family History :     Family History  Problem Relation Age of Onset  . Diabetes Father   . Colon cancer Father   . Colon cancer Mother    Reviewed   Home Medications:   Prior to Admission medications   Medication Sig Start Date End Date Taking? Authorizing Provider  aspirin EC 81 MG tablet Take 1 tablet (81 mg total) by mouth daily. 12/22/16   Evans Lance, MD  dexamethasone (DECADRON) 6 MG tablet Take 1 tablet (6 mg total) by mouth daily. 03/05/20   Orson Eva, MD  glipiZIDE (GLUCOTROL) 5 MG tablet TAKE 1 TABLET DAILY. 12/24/19 03/23/20  Gosrani, Nimish C, MD  glucose blood test strip Use to check your blood glucose once a day. 01/10/20   Ailene Ards, NP  metFORMIN (GLUCOPHAGE-XR) 500 MG 24 hr tablet TAKE (1) TABLET TWICE DAILY. Patient taking differently: Take 500 mg by mouth 2 (two) times daily.  12/17/19   Doree Albee, MD  metoprolol tartrate 75 MG TABS Take 75 mg by mouth 2 (two) times daily. 03/05/20   Orson Eva, MD  pravastatin (PRAVACHOL) 20 MG tablet Take 1 tablet (20 mg total) by mouth daily. 11/08/19   Ailene Ards, NP  vitamin B-12 (CYANOCOBALAMIN) 500 MCG tablet Take 1 tablet (500 mcg total) by mouth daily. 03/06/20   Orson Eva, MD  glipiZIDE (GLUCOTROL) 5 MG tablet Take 5 mg by mouth daily  before breakfast.    [provider]  glipiZIDE (GLUCOTROL) 5 MG tablet TAKE 1 TABLET ONCE DAILY. 05/28/19   Gosrani, Nimish C, MD  glipiZIDE (GLUCOTROL) 5 MG tablet TAKE 1 TABLET DAILY. 11/23/19  Doree Albee, MD  metFORMIN (GLUCOPHAGE XR) 500 MG 24 hr tablet Take 1 tablet (500 mg total) by mouth 2 (two) times a day. 01/19/19   Noemi Chapel, MD  metFORMIN (GLUCOPHAGE-XR) 500 MG 24 hr tablet TAKE (1) TABLET TWICE DAILY. 07/21/19   Doree Albee, MD     Allergies:     Allergies  Allergen Reactions  . Influenza Vaccines Other (See Comments)    Guillain-Barre syndrome     Physical Exam:   Vitals  Blood pressure 124/86, pulse 100, temperature (!) 97.5 F (36.4 C), temperature source Oral, resp. rate 19, SpO2 99 %.  1.  General: Lying supine in bed in no acute distress  2. Psychiatric: Flat affect Appropriate behavior  3. Neurologic: Cranial nerves II through XII are grossly intact Moves all 4 extremities voluntarily Decreased sensation in the lower extremities bilaterally-chronic Alert and oriented x3 with confusion while providing history  4. HEENMT:  Head is atraumatic, normocephalic, neck is supple, mucous membranes are dry, trachea is midline  5. Respiratory : Minimal wheeze in the right lung field, clear left lung field, no crackles or rhonchi  6. Cardiovascular : Heart rate is tachycardic, rhythm is irregularly irregular, no murmurs rubs or gallops  7. Gastrointestinal:  Abdomen is soft, nondistended, nontender to palpation  8. Skin:  Cyanosis of toes bilaterally-chronic  9.Musculoskeletal:  Peripheral pulses palpated, no peripheral edema    Data Review:    CBC Recent Labs  Lab 03/13/20 1803  WBC 8.9  HGB 16.7  HCT 51.0  PLT 195  MCV 92.6  MCH 30.3  MCHC 32.7  RDW 13.8  LYMPHSABS 0.6*  MONOABS 0.3  EOSABS 0.0  BASOSABS 0.0    ------------------------------------------------------------------------------------------------------------------  Results for orders placed or performed during the hospital encounter of 03/13/20 (from the past 48 hour(s))  Blood Culture (routine x 2)     Status: None (Preliminary result)   Collection Time: 03/13/20  5:05 PM   Specimen: Left Antecubital; Blood  Result Value Ref Range   Specimen Description LEFT ANTECUBITAL    Special Requests      BOTTLES DRAWN AEROBIC AND ANAEROBIC Blood Culture adequate volume Performed at [facility], [address], [place], [place] [address]    Culture PENDING    Report Status PENDING   CBC WITH DIFFERENTIAL     Status: Abnormal   Collection Time: 03/13/20  6:03 PM  Result Value Ref Range   WBC 8.9 4.0 - 10.5 K/uL   RBC 5.51 4.22 - 5.81 MIL/uL   Hemoglobin 16.7 13.0 - 17.0 g/dL   HCT 51.0 39 - 52 %   MCV 92.6 80.0 - 100.0 fL   MCH 30.3 26.0 - 34.0 pg   MCHC 32.7 30.0 - 36.0 g/dL   RDW 13.8 11.5 - 15.5 %   Platelets 195 150 - 400 K/uL   nRBC 0.0 0.0 - 0.2 %   Neutrophils Relative % 90 %   Neutro Abs 7.9 (H) 1.7 - 7.7 K/uL   Lymphocytes Relative 6 %   Lymphs Abs 0.6 (L) 0.7 - 4.0 K/uL   Monocytes Relative 4 %   Monocytes Absolute 0.3 0 - 1 K/uL   Eosinophils Relative 0 %   Eosinophils Absolute 0.0 0 - 0 K/uL   Basophils Relative 0 %   Basophils Absolute 0.0 0 - 0 K/uL   Immature Granulocytes 0 %   Abs Immature Granulocytes 0.04 0.00 - 0.07 K/uL    Comment: Performed at [facility],  [address] [place], [place] [address]  Comprehensive metabolic panel     Status: Abnormal   Collection Time: 03/13/20  6:03 PM  Result Value Ref Range   Sodium 133 (L) 135 - 145 mmol/L   Potassium 4.3 3.5 - 5.1 mmol/L   Chloride 103 98 - 111 mmol/L   CO2 19 (L) 22 - 32 mmol/L   Glucose, Bld 231 (H) 70 - 99 mg/dL    Comment: Glucose reference range applies only to samples taken after fasting for at least 8 hours.   BUN 47 (H) 8 - 23 mg/dL    Creatinine, Ser 1.57 (H) 0.61 - 1.24 mg/dL   Calcium 7.9 (L) 8.9 - 10.3 mg/dL   Total Protein 5.9 (L) 6.5 - 8.1 g/dL   Albumin 2.5 (L) 3.5 - 5.0 g/dL   AST 38 15 - 41 U/L   ALT 36 0 - 44 U/L   Alkaline Phosphatase 64 38 - 126 U/L   Total Bilirubin 1.4 (H) 0.3 - 1.2 mg/dL   GFR calc non Af Amer 42 (L) >60 mL/min   GFR calc Af Amer 49 (L) >60 mL/min   Anion gap 11 5 - 15    Comment: Performed at [facility], [address] [place], [place] [address]  D-dimer, quantitative     Status: Abnormal   Collection Time: 03/13/20  6:03 PM  Result Value Ref Range   D-Dimer, Quant 3.59 (H) 0.00 - 0.50 ug/mL-FEU    Comment: (NOTE) At the manufacturer cut-off of 0.50 ug/mL FEU, this assay has been documented to exclude PE with a sensitivity and negative predictive value of 97 to 99%.  At this time, this assay has not been approved by the FDA to exclude DVT/VTE. Results should be correlated with clinical presentation. Performed at [facility], [address], [place], [place] [address]   Procalcitonin     Status: None   Collection Time: 03/13/20  6:03 PM  Result Value Ref Range   Procalcitonin 1.46 ng/mL    Comment:        Interpretation: PCT > 0.5 ng/mL and <= 2 ng/mL: Systemic infection (sepsis) is possible, but other conditions are known to elevate PCT as well. (NOTE)       Sepsis PCT Algorithm           Lower Respiratory Tract                                      Infection PCT Algorithm    ----------------------------     ----------------------------         PCT < 0.25 ng/mL                PCT < 0.10 ng/mL          Strongly encourage             Strongly discourage   discontinuation of antibiotics    initiation of antibiotics    ----------------------------     -----------------------------       PCT 0.25 - 0.50 ng/mL            PCT 0.10 - 0.25 ng/mL               OR       >80% decrease in PCT            Discourage initiation of  antibiotics      Encourage discontinuation           of antibiotics    ----------------------------     -----------------------------         PCT >= 0.50 ng/mL              PCT 0.26 - 0.50 ng/mL                AND       <80% decrease in PCT             Encourage initiation of                                             antibiotics       Encourage continuation           of antibiotics    ----------------------------     -----------------------------        PCT >= 0.50 ng/mL                  PCT > 0.50 ng/mL               AND         increase in PCT                  Strongly encourage                                      initiation of antibiotics    Strongly encourage escalation           of antibiotics                                     -----------------------------                                           PCT <= 0.25 ng/mL                                                 OR                                        > 80% decrease in PCT                                      Discontinue / Do not initiate                                             antibiotics  Performed at [facility], [address], [place], [place] [address]   Lactate dehydrogenase     Status: None   Collection Time: 03/13/20  6:03 PM  Result Value Ref Range   LDH 186 98 - 192 U/L    Comment: Performed at [facility], [address], [place], [place] [address]  Triglycerides     Status: None   Collection Time: 03/13/20  6:03 PM  Result Value Ref Range   Triglycerides 135 <150 mg/dL    Comment: Performed at [facility], [address], [place], [place] [address]  Fibrinogen     Status: Abnormal   Collection Time: 03/13/20  6:03 PM  Result Value Ref Range   Fibrinogen 716 (H) 210 - 475 mg/dL    Comment: Performed at [facility], [address] [place], [place] [address]  Protime-INR     Status: Abnormal   Collection Time: 03/13/20  6:03 PM  Result Value Ref Range   Prothrombin Time 15.5 (H) 11.4 - 15.2  seconds   INR 1.3 (H) 0.8 - 1.2    Comment: (NOTE) INR goal varies based on device and disease states. Performed at [facility], [address] [place], [place] [address]   Lactic acid, plasma     Status: None   Collection Time: 03/13/20  6:20 PM  Result Value Ref Range   Lactic Acid, Venous 1.4 0.5 - 1.9 mmol/L    Comment: Performed at [facility], [address] [place], [place] [address]  Urinalysis, Routine w reflex microscopic Urine, Clean Catch     Status: Abnormal   Collection Time: 03/13/20  8:07 PM  Result Value Ref Range   Color, Urine YELLOW YELLOW   APPearance HAZY (A) CLEAR   Specific Gravity, Urine 1.021 1.005 - 1.030   pH 5.0 5.0 - 8.0   Glucose, UA 50 (A) NEGATIVE mg/dL   Hgb urine dipstick SMALL (A) NEGATIVE   Bilirubin Urine NEGATIVE NEGATIVE   Ketones, ur 5 (A) NEGATIVE mg/dL   Protein, ur 30 (A) NEGATIVE mg/dL   Nitrite NEGATIVE NEGATIVE   Leukocytes,Ua NEGATIVE NEGATIVE   RBC / HPF 0-5 0 - 5 RBC/hpf   WBC, UA 0-5 0 - 5 WBC/hpf   Bacteria, UA NONE SEEN NONE SEEN   Squamous Epithelial / LPF 0-5 0 - 5   Mucus PRESENT    Uric Acid Crys, UA PRESENT     Comment: Performed at [facility], [address], [place], [place] [address]    Chemistries  Recent Labs  Lab 03/13/20 1803  NA 133*  K 4.3  CL 103  CO2 19*  GLUCOSE 231*  BUN 47*  CREATININE 1.57*  CALCIUM 7.9*  AST 38  ALT 36  ALKPHOS 64  BILITOT 1.4*   ------------------------------------------------------------------------------------------------------------------  ------------------------------------------------------------------------------------------------------------------ GFR: Estimated Creatinine Clearance: 46.5 mL/min (A) (by C-G formula based on SCr of 1.57 mg/dL (H)). Liver Function Tests: Recent Labs  Lab 03/13/20 1803  AST 38  ALT 36  ALKPHOS 64  BILITOT 1.4*  PROT 5.9*  ALBUMIN 2.5*   No results for input(s): LIPASE, AMYLASE in the last 168 hours. No results for  input(s): AMMONIA in the last 168 hours. Coagulation Profile: Recent Labs  Lab 03/13/20 1803  INR 1.3*   Cardiac Enzymes: No results for input(s): CKTOTAL, CKMB, CKMBINDEX, TROPONINI in the last 168 hours. BNP (last 3 results) No results for input(s): PROBNP in the last 8760 hours. HbA1C: No results for input(s): HGBA1C in the last 72 hours. CBG: No results for input(s): GLUCAP in the last 168 hours. Lipid Profile: Recent Labs    03/13/20 1803  TRIG 135   Thyroid Function  Tests: No results for input(s): TSH, T4TOTAL, FREET4, T3FREE, THYROIDAB in the last 72 hours. Anemia Panel: No results for input(s): VITAMINB12, FOLATE, FERRITIN, TIBC, IRON, RETICCTPCT in the last 72 hours.  --------------------------------------------------------------------------------------------------------------- Urine analysis:    Component Value Date/Time   COLORURINE YELLOW 03/13/2020 2007   APPEARANCEUR HAZY (A) 03/13/2020 2007   LABSPEC 1.021 03/13/2020 2007   PHURINE 5.0 03/13/2020 2007   GLUCOSEU 50 (A) 03/13/2020 2007   HGBUR SMALL (A) 03/13/2020 2007   BILIRUBINUR NEGATIVE 03/13/2020 2007   KETONESUR 5 (A) 03/13/2020 2007   PROTEINUR 30 (A) 03/13/2020 2007   UROBILINOGEN 1.0 07/21/2013 2130   NITRITE NEGATIVE 03/13/2020 2007   LEUKOCYTESUR NEGATIVE 03/13/2020 2007      Imaging Results:    CT ANGIO CHEST PE W OR WO CONTRAST  Result Date: 03/13/2020 CLINICAL DATA:  PE suspected, low/intermediate prob, positive D-dimer COVID positive 03/03/2020 EXAM: CT ANGIOGRAPHY CHEST WITH CONTRAST TECHNIQUE: Multidetector CT imaging of the chest was performed using the standard protocol during bolus administration of intravenous contrast. Multiplanar CT image reconstructions and MIPs were obtained to evaluate the vascular anatomy. CONTRAST:  33m OMNIPAQUE IOHEXOL 350 MG/ML SOLN COMPARISON:  Radiograph earlier today. FINDINGS: Cardiovascular: There are no filling defects within the pulmonary arteries  to suggest pulmonary embolus. Thoracic aorta is normal in caliber. Mild aortic atherosclerosis. Mild multi chamber cardiomegaly. No pericardial effusion. Scattered coronary artery calcifications. Mediastinum/Nodes: Shotty mediastinal and hilar lymph nodes are all subcentimeter short axis, likely reactive. Tiny hiatal hernia. Patulous upper esophagus. No thyroid nodule. Lungs/Pleura: Patchy multifocal ill-defined and nodular ground-glass opacities within both lungs, pattern typical of COVID-19 pneumonia. Additional dependent atelectasis in the lower lobes. There are bilateral calcified pleural plaques. No pleural fluid. Trachea and central bronchi are patent. Upper Abdomen: No acute or unexpected finding.  Tiny hiatal hernia. Musculoskeletal: There are no acute or suspicious osseous abnormalities. Multilevel degenerative change in the spine. Review of the MIP images confirms the above findings. IMPRESSION: 1. No pulmonary embolus. 2. Patchy multifocal ill-defined and nodular ground-glass opacities within both lungs, pattern typical of COVID-19 pneumonia. Parenchymal involvement is mild. 3. A few scattered calcified pleural plaques consistent with prior asbestos exposure. 4. Mild cardiomegaly. Coronary artery calcifications. Aortic Atherosclerosis (ICD10-I70.0). Electronically Signed   By: MKeith RakeM.D.   On: 03/13/2020 20:54   DG Chest Port 1 View  Result Date: 03/13/2020 CLINICAL DATA:  COVID positive with weakness. EXAM: PORTABLE CHEST 1 VIEW COMPARISON:  March 03, 2020 FINDINGS: Small ill-defined multifocal infiltrates are seen along the periphery of both lungs, right greater than left. There is no evidence of a pleural effusion or pneumothorax. The cardiac silhouette is borderline in size. There is mild calcification of the aortic arch. The visualized skeletal structures are unremarkable. IMPRESSION: Mild multifocal infiltrates, right greater than left. Electronically Signed   By: TVirgina Norfolk M.D.   On: 03/13/2020 17:36    My personal review of EKG: Rhythm afib, Rate 102 /min, QTc 481 ,no Acute ST changes   Assessment & Plan:    Active Problems:   Pneumonia due to COVID-19 virus   1. Covid pneumonia 1. Recently discharged after a hospitalization for covid 2. Finished course of remdesivir and decadron at last admission 3. Restart course of decadron, remdesivir has not been reordered 4. Continue albuterol  5. LDH 186, triglycerides 135, pro-Cal 1.46, D-dimer 3.59, fibrinogen 716 6. Procal has tripled since last admission - cover superimposed bacterial pneumonia with rocephin and zithromax 7. Continue supportive therapies 8.  Continue to monitor 2. Acute hypoxic respiratory failure 1. Oxygen saturation 88% at rest on room air at arrival 2. Improved with 2 L nasal cannula 3. Secondary to pneumonia 4. Wean off O2 as tolerated 5. Continue inhalers, antibiotics, and steroids as above 6. Monitor on telemetry 3. Elevated D-Dimer 1. D-dimer has doubled from 1.71 to 3.59 since last admission 2. CTA to rule out PE shows: No pulmonary emboli, patchy multifocal ill-defined and nodular groundglass opacities within both lungs pattern typical of COVID-19 pneumonia, pleural plaques consistent with prior asbestos exposure 4. Protein Cal malnutrition 1. Albumin 2.5 2. Encourage nutrient dense p.o. intake 3. Continue Ensure supplementation 4. Continue to monitor 5. Elevated Cr 1. Creatinine at baseline is 1.3-1.4 2. Today creatinine is 1.57 3. Continue fluid hydration 4. Does not meet criteria for DKA 6. Covid toes 1. Patient has cyanotic toes 2. Does have a history of cyanotic toes 3. Pulses palpable 4. Continue to monitor 7. Diabetes mellitus type 2 1. Sliding scale coverage 2. Nightly coverage 3. Continue to monitor 8. Atrial fibrillation 1. Continue home medication   DVT Prophylaxis-   Heparin - SCDs   AM Labs Ordered, also please review Full Orders  Family  Communication: No family at bedside Code Status:  Full  Admission status: Inpatient :The appropriate admission status for this patient is INPATIENT. Inpatient status is judged to be reasonable and necessary in order to provide the required intensity of service to ensure the patient's safety. The patient's presenting symptoms, physical exam findings, and initial radiographic and laboratory data in the context of their chronic comorbidities is felt to place them at high risk for further clinical deterioration. Furthermore, it is not anticipated that the patient will be medically stable for discharge from the hospital within 2 midnights of admission. The following factors support the admission status of inpatient.     The patient's presenting symptoms include Weakness The worrisome physical exam findings include hypoxia 88% on room air The initial radiographic and laboratory data are worrisome because of CXR = mild multifocal infiltrates r>l The chronic co-morbidities include DMII, Afib/Aflutter       * I certify that at the point of admission it is my clinical judgment that the patient will require inpatient hospital care spanning beyond 2 midnights from the point of admission due to high intensity of service, high risk for further deterioration and high frequency of surveillance required.*  Time spent in minutes : [place]

## 2020-03-13 NOTE — ED Notes (Signed)
Have applied 2LNC.

## 2020-03-13 NOTE — ED Triage Notes (Signed)
Pt tested postiive for COVID August 9. Was transported to [facility] and received antibody infusion. Yesterday, pt fell and EMS was called out. He was weak yesterday and afebrile. Today he is running a fever, incontinent, and febrile. At 1430 2 extra strength tylenols given. Pt slightly lethargic. CBG 267. BP 124/72. A fib on the monitor. Uncontrolled. Pulse ox in the 80's. Hx of GBS. 4L [place].. Given fluid bolus. 20 G IV.

## 2020-03-13 NOTE — ED Provider Notes (Signed)
[facility] EMERGENCY DEPARTMENT Provider Note   CSN: [id number] Arrival date & time: 03/13/20  1542     History Chief Complaint  Patient presents with  . COVID Positive    Marcus [name] is a 74 y.o. male.  HPI   Patient presented to the ED for evaluation of increasing weakness and falls.  Patient was recently diagnosed with Covid back on August 9.  He was discharged on August 11.  Patient started increasing fatigue, decreased p.o. intake and weakness over the last several days.  Patient was treated with remdesivir.  Patient also seem to be having some issues with confusion according to the wife.  This was noted in the medical records during his video visit with his doctor 2 days ago.  EMS was called today because the patient has been incontinent and has been having increasing weakness.  He continues to remain febrile.  Patient states he is feeling more short of breath.  He denies any chest pain.  No headache.  No abdominal pain.  Past Medical History:  Diagnosis Date  . AF (atrial fibrillation) ([place])   . Atrial flutter ([place])   . Blue toes    chronic  . Essential hypertension, benign 04/23/2019  . GERD (gastroesophageal reflux disease)   . Gout   . Guillain Barr syndrome ([place])   . Type II diabetes mellitus, uncontrolled ([place]) 04/23/2019    Patient Active Problem List   Diagnosis Date Noted  . AKI (acute kidney injury) ([place])   . Pneumonia due to COVID-19 virus 03/04/2020  . Acute metabolic encephalopathy 71/12/2692  . COVID-19 virus infection 03/03/2020  . History of colonic polyps 08/21/2019  . Peripheral vascular disease ([place]) 08/01/2019  . Type II diabetes mellitus, uncontrolled ([place]) 04/23/2019  . Essential hypertension, benign 04/23/2019  . C. difficile colitis 11/09/2017  . Focal neurological deficit 07/21/2013  . GBS (Guillain Barre syndrome) ([place]) 07/21/2013  . Leg weakness 07/21/2013  . Femur fracture ([place]) 06/25/2013  . Femur fracture, right ([place])  06/25/2013  . GOUT 07/31/2009  . Atrial fibrillation with RVR ([place]) 07/31/2009  . Atrial flutter ([place]) 07/31/2009    Past Surgical History:  Procedure Laterality Date  . catheter ablation  12/23/2004  . COLONOSCOPY N/A 09/18/2014   Procedure: COLONOSCOPY;  Surgeon: Rogene Houston, MD;  Location[facility];  Service: Endoscopy;  Laterality: N/A;  830  . EYE SURGERY  2020   Bilateral cataract surgery  . FEMUR IM NAIL Right 06/25/2013   Procedure: INTRAMEDULLARY (IM) RETROGRADE FEMORAL NAILING;  Surgeon: Marybelle Killings, MD;  Location: [facility];  Service: Orthopedics;  Laterality: Right;       Family History  Problem Relation Age of Onset  . Diabetes Father   . Colon cancer Father   . Colon cancer Mother     Social History   Tobacco Use  . Smoking status: Former Smoker    Packs/day: 2.00    Years: 5.00    Pack years: 10.00    Types: Cigarettes    Start date: 11/23/2016    Quit date: 11/24/2016    Years since quitting: 3.3  . Smokeless tobacco: Never Used  Vaping Use  . Vaping Use: Never used  Substance Use Topics  . Alcohol use: No  . Drug use: No    Home Medications Prior to Admission medications   Medication Sig Start Date End Date Taking? Authorizing Provider  aspirin EC 81 MG tablet Take 1 tablet (81 mg total) by mouth  daily. 12/22/16   Evans Lance, MD  dexamethasone (DECADRON) 6 MG tablet Take 1 tablet (6 mg total) by mouth daily. 03/05/20   Orson Eva, MD  glipiZIDE (GLUCOTROL) 5 MG tablet TAKE 1 TABLET DAILY. 12/24/19 03/23/20  Gosrani, Nimish C, MD  glucose blood test strip Use to check your blood glucose once a day. 01/10/20   Ailene Ards, NP  metFORMIN (GLUCOPHAGE-XR) 500 MG 24 hr tablet TAKE (1) TABLET TWICE DAILY. Patient taking differently: Take 500 mg by mouth 2 (two) times daily.  12/17/19   Doree Albee, MD  metoprolol tartrate 75 MG TABS Take 75 mg by mouth 2 (two) times daily. 03/05/20   Orson Eva, MD  pravastatin (PRAVACHOL) 20 MG tablet Take 1  tablet (20 mg total) by mouth daily. 11/08/19   Ailene Ards, NP  prednisoLONE acetate (PRED FORTE) 1 % ophthalmic suspension  07/09/19   [provider]  vitamin B-12 (CYANOCOBALAMIN) 500 MCG tablet Take 1 tablet (500 mcg total) by mouth daily. 03/06/20   Orson Eva, MD  glipiZIDE (GLUCOTROL) 5 MG tablet Take 5 mg by mouth daily before breakfast.    [provider]  glipiZIDE (GLUCOTROL) 5 MG tablet TAKE 1 TABLET ONCE DAILY. 05/28/19   Gosrani, Nimish C, MD  glipiZIDE (GLUCOTROL) 5 MG tablet TAKE 1 TABLET DAILY. 11/23/19   Doree Albee, MD  metFORMIN (GLUCOPHAGE XR) 500 MG 24 hr tablet Take 1 tablet (500 mg total) by mouth 2 (two) times a day. 01/19/19   Noemi Chapel, MD  metFORMIN (GLUCOPHAGE-XR) 500 MG 24 hr tablet TAKE (1) TABLET TWICE DAILY. 07/21/19   Doree Albee, MD    Allergies    Influenza vaccines  Review of Systems   Review of Systems  Physical Exam Updated Vital Signs BP 129/87   Pulse 65   Temp (!) 101.7 F (38.7 C) (Rectal)   Resp 19   SpO2 96%   Physical Exam Vitals and nursing note reviewed.  Constitutional:      Appearance: He is well-developed. He is ill-appearing.  HENT:     Head: Normocephalic and atraumatic.     Right Ear: External ear normal.     Left Ear: External ear normal.  Eyes:     General: No scleral icterus.       Right eye: No discharge.        Left eye: No discharge.     Conjunctiva/sclera: Conjunctivae normal.  Neck:     Trachea: No tracheal deviation.  Cardiovascular:     Rate and Rhythm: Regular rhythm. Tachycardia present.     Comments: Weak dorsalis pedis pulse palpable bilaterally Pulmonary:     Effort: Pulmonary effort is normal. No respiratory distress.     Breath sounds: Normal breath sounds. No stridor. No wheezing or rales.  Abdominal:     General: Bowel sounds are normal. There is no distension.     Palpations: Abdomen is soft.     Tenderness: There is no abdominal tenderness. There is no guarding  or rebound.  Musculoskeletal:        General: No tenderness.     Cervical back: Neck supple.  Skin:    General: Skin is warm and dry.     Findings: No rash.     Comments: Cyanosis noted bilateral feet and toes  Neurological:     Mental Status: He is alert.     Cranial Nerves: No cranial nerve deficit (no facial droop, extraocular movements intact, no  slurred speech).     Sensory: No sensory deficit.     Motor: Weakness present. No abnormal muscle tone or seizure activity.     Coordination: Coordination normal.     Comments: Patient is able to grip my hands and lift his arms off the bed, he is able to lift both legs off the bed, he is aware of his name, location and the date     ED Results / Procedures / Treatments   Labs (all labs ordered are listed, but only abnormal results are displayed) Labs Reviewed  CBC WITH DIFFERENTIAL/PLATELET - Abnormal; Notable for the following components:      Result Value   Neutro Abs 7.9 (*)    Lymphs Abs 0.6 (*)    All other components within normal limits  COMPREHENSIVE METABOLIC PANEL - Abnormal; Notable for the following components:   Sodium 133 (*)    CO2 19 (*)    Glucose, Bld 231 (*)    BUN 47 (*)    Creatinine, Ser 1.57 (*)    Calcium 7.9 (*)    Total Protein 5.9 (*)    Albumin 2.5 (*)    Total Bilirubin 1.4 (*)    GFR calc non Af Amer 42 (*)    GFR calc Af Amer 49 (*)    All other components within normal limits  D-DIMER, QUANTITATIVE (NOT AT [facility]) - Abnormal; Notable for the following components:   D-Dimer, Quant 3.59 (*)    All other components within normal limits  FIBRINOGEN - Abnormal; Notable for the following components:   Fibrinogen 716 (*)    All other components within normal limits  PROTIME-INR - Abnormal; Notable for the following components:   Prothrombin Time 15.5 (*)    INR 1.3 (*)    All other components within normal limits  CULTURE, BLOOD (ROUTINE X 2)  CULTURE, BLOOD (ROUTINE X 2)  LACTIC ACID, PLASMA    PROCALCITONIN  LACTATE DEHYDROGENASE  TRIGLYCERIDES  LACTIC ACID, PLASMA  FERRITIN  C-REACTIVE PROTEIN    EKG EKG Interpretation  Date/Time:  Thursday March 13 2020 17:01:12 EDT Ventricular Rate:  102 PR Interval:    QRS Duration: 101 QT Interval:  369 QTC Calculation: 481 R Axis:   118 Text Interpretation: Atrial fibrillation Right axis deviation Repol abnrm suggests ischemia, anterolateral Since last tracing rate slower Confirmed by Dorie Rank [phone]) on 03/13/2020 5:12:20 PM   Radiology DG Chest Port 1 View  Result Date: 03/13/2020 CLINICAL DATA:  COVID positive with weakness. EXAM: PORTABLE CHEST 1 VIEW COMPARISON:  March 03, 2020 FINDINGS: Small ill-defined multifocal infiltrates are seen along the periphery of both lungs, right greater than left. There is no evidence of a pleural effusion or pneumothorax. The cardiac silhouette is borderline in size. There is mild calcification of the aortic arch. The visualized skeletal structures are unremarkable. IMPRESSION: Mild multifocal infiltrates, right greater than left. Electronically Signed   By: Virgina Norfolk M.D.   On: 03/13/2020 17:36    Procedures .Critical Care Performed by: Dorie Rank, MD Authorized by: Dorie Rank, MD   Critical care provider statement:    Critical care time (minutes):  45   Critical care was time spent personally by me on the following activities:  Discussions with consultants, evaluation of patient's response to treatment, examination of patient, ordering and performing treatments and interventions, ordering and review of laboratory studies, ordering and review of radiographic studies, pulse oximetry, re-evaluation of patient's condition, obtaining history from patient or surrogate and  review of old charts   (including critical care time)  Medications Ordered in ED Medications  0.9 %  sodium chloride infusion (1,000 mLs Intravenous New Bag/Given 03/13/20 1716)  cefTRIAXone (ROCEPHIN) 1 g in  sodium chloride 0.9 % 100 mL IVPB (has no administration in time range)  acetaminophen (TYLENOL) tablet 650 mg (650 mg Oral Given 03/13/20 1929)    ED Course  I have reviewed the triage vital signs and the nursing notes.  Pertinent labs & imaging results that were available during my care of the patient were reviewed by me and considered in my medical decision making (see chart for details).  Clinical Course as of Mar 13 1944  Thu Mar 13, 2020  1941 Labs reviewed.   [JK]  1942 Patient's BUN and creatinine shows an increase from previous values.  Protein remains low.   [JK]  1942 Chest x-ray shows persistent Covid infiltrates.   [JK]  1942 Patient has an oxygen requirement progressive weakness associated with his known Covid.   [JK]    Clinical Course User Index [JK] Dorie Rank, MD   MDM Rules/Calculators/A&P                         Marcus [name] was evaluated in Emergency Department on 03/13/2020 for the symptoms described in the history of present illness. He was evaluated in the context of the global COVID-19 pandemic, which necessitated consideration that the patient might be at risk for infection with the SARS-CoV-2 virus that causes COVID-19. Institutional protocols and algorithms that pertain to the evaluation of patients at risk for COVID-19 are in a state of rapid change based on information released by regulatory bodies including the CDC and federal and state organizations. These policies and algorithms were followed during the patient's care in the ED.  Patient presented to the ED for evaluation of increasing weakness associated with known Covid infection.  Patient was recently hospitalized for Covid.  Patient started feeling poorly again after a few days.  Patient does have increasing weakness.  He remains febrile and has worsening infiltrates.  Patient given a dose of Rocephin for possible bacterial superinfection.  Symptoms are likely related to his Covid pneumonia.   I will consult the medical service for admission and further treatment.  We will discuss whether or not to give additional remdesivir and Decadron although I believe he already finished his first course of those medications Final Clinical Impression(s) / ED Diagnoses Final diagnoses:  Pneumonia due to COVID-19 virus  Weakness      Dorie Rank, MD 03/13/20 1945

## 2020-03-14 ENCOUNTER — Other Ambulatory Visit: Payer: Self-pay

## 2020-03-14 ENCOUNTER — Encounter (HOSPITAL_COMMUNITY): Payer: Self-pay | Admitting: Family Medicine

## 2020-03-14 DIAGNOSIS — U071 COVID-19: Secondary | ICD-10-CM

## 2020-03-14 DIAGNOSIS — M1A9XX0 Chronic gout, unspecified, without tophus (tophi): Secondary | ICD-10-CM

## 2020-03-14 DIAGNOSIS — J1282 Pneumonia due to coronavirus disease 2019: Secondary | ICD-10-CM

## 2020-03-14 DIAGNOSIS — R238 Other skin changes: Secondary | ICD-10-CM

## 2020-03-14 DIAGNOSIS — R531 Weakness: Secondary | ICD-10-CM

## 2020-03-14 DIAGNOSIS — I4891 Unspecified atrial fibrillation: Secondary | ICD-10-CM

## 2020-03-14 DIAGNOSIS — I10 Essential (primary) hypertension: Secondary | ICD-10-CM

## 2020-03-14 DIAGNOSIS — R29898 Other symptoms and signs involving the musculoskeletal system: Secondary | ICD-10-CM

## 2020-03-14 LAB — GLUCOSE, CAPILLARY
Glucose-Capillary: 166 mg/dL — ABNORMAL HIGH (ref 70–99)
Glucose-Capillary: 352 mg/dL — ABNORMAL HIGH (ref 70–99)
Glucose-Capillary: 262 mg/dL — ABNORMAL HIGH (ref 70–99)
Glucose-Capillary: 104 mg/dL — ABNORMAL HIGH (ref 70–99)

## 2020-03-14 LAB — COMPREHENSIVE METABOLIC PANEL
ALT: 47 U/L — ABNORMAL HIGH (ref 0–44)
AST: 62 U/L — ABNORMAL HIGH (ref 15–41)
Albumin: 2.4 g/dL — ABNORMAL LOW (ref 3.5–5.0)
Alkaline Phosphatase: 65 U/L (ref 38–126)
Anion gap: 12 (ref 5–15)
BUN: 37 mg/dL — ABNORMAL HIGH (ref 8–23)
CO2: 19 mmol/L — ABNORMAL LOW (ref 22–32)
Calcium: 7.8 mg/dL — ABNORMAL LOW (ref 8.9–10.3)
Chloride: 103 mmol/L (ref 98–111)
Creatinine, Ser: 1.31 mg/dL — ABNORMAL HIGH (ref 0.61–1.24)
GFR calc Af Amer: >60 mL/min (ref >60)
GFR calc non Af Amer: 53 mL/min — ABNORMAL LOW (ref >60)
Glucose, Bld: 186 mg/dL — ABNORMAL HIGH (ref 70–99)
Potassium: 4.6 mmol/L (ref 3.5–5.1)
Sodium: 134 mmol/L — ABNORMAL LOW (ref 135–145)
Total Bilirubin: 1.1 mg/dL (ref 0.3–1.2)
Total Protein: 5.8 g/dL — ABNORMAL LOW (ref 6.5–8.1)

## 2020-03-14 LAB — CBC WITH DIFFERENTIAL/PLATELET
Abs Immature Granulocytes: 0.04 10*3/uL (ref 0.00–0.07)
Basophils Absolute: 0 10*3/uL (ref 0.0–0.1)
Basophils Relative: 0 %
Eosinophils Absolute: 0 10*3/uL (ref 0.0–0.5)
Eosinophils Relative: 0 %
HCT: 49.5 % (ref 39.0–52.0)
Hemoglobin: 15.9 g/dL (ref 13.0–17.0)
Immature Granulocytes: 1 %
Lymphocytes Relative: 4 %
Lymphs Abs: 0.3 10*3/uL — ABNORMAL LOW (ref 0.7–4.0)
MCH: 30.2 pg (ref 26.0–34.0)
MCHC: 32.1 g/dL (ref 30.0–36.0)
MCV: 94.1 fL (ref 80.0–100.0)
Monocytes Absolute: 0.1 10*3/uL (ref 0.1–1.0)
Monocytes Relative: 2 %
Neutro Abs: 7.9 10*3/uL — ABNORMAL HIGH (ref 1.7–7.7)
Neutrophils Relative %: 93 %
Platelets: 181 10*3/uL (ref 150–400)
RBC: 5.26 MIL/uL (ref 4.22–5.81)
RDW: 13.9 % (ref 11.5–15.5)
WBC: 8.4 10*3/uL (ref 4.0–10.5)
nRBC: 0 % (ref 0.0–0.2)

## 2020-03-14 LAB — MAGNESIUM: Magnesium: 2 mg/dL (ref 1.7–2.4)

## 2020-03-14 LAB — ABO/RH: ABO/RH(D): O POS

## 2020-03-14 LAB — D-DIMER, QUANTITATIVE: D-Dimer, Quant: 2.71 ug/mL-FEU — ABNORMAL HIGH (ref 0.00–0.50)

## 2020-03-14 MED ORDER — ADULT MULTIVITAMIN W/MINERALS CH
1.0000 | ORAL_TABLET | Freq: Every day | ORAL | Status: DC
  Administered 2020-03-14 – 2020-03-17 (×4): 1 via ORAL
  Filled 2020-03-14 (×4): qty 1

## 2020-03-14 MED ORDER — DEXAMETHASONE 4 MG PO TABS: 6.0000 mg | ORAL_TABLET | Freq: Every day | ORAL | Status: DC

## 2020-03-14 MED ORDER — METOPROLOL TARTRATE 25 MG PO TABS
75.0000 mg | ORAL_TABLET | Freq: Two times a day (BID) | ORAL | Status: DC
  Administered 2020-03-14 – 2020-03-17 (×8): 75 mg via ORAL
  Filled 2020-03-14 (×8): qty 3

## 2020-03-14 MED ORDER — HEPARIN SODIUM (PORCINE) 5000 UNIT/ML IJ SOLN
5000.0000 [IU] | Freq: Three times a day (TID) | INTRAMUSCULAR | Status: DC
  Administered 2020-03-14 – 2020-03-17 (×11): 5000 [IU] via SUBCUTANEOUS
  Filled 2020-03-14 (×11): qty 1

## 2020-03-14 MED ORDER — ALBUTEROL SULFATE HFA 108 (90 BASE) MCG/ACT IN AERS
2.0000 | INHALATION_SPRAY | Freq: Four times a day (QID) | RESPIRATORY_TRACT | Status: DC
  Administered 2020-03-14: 2 via RESPIRATORY_TRACT
  Filled 2020-03-14: qty 6.7

## 2020-03-14 MED ORDER — PRAVASTATIN SODIUM 10 MG PO TABS
20.0000 mg | ORAL_TABLET | Freq: Every day | ORAL | Status: DC
  Administered 2020-03-14 – 2020-03-17 (×4): 20 mg via ORAL
  Filled 2020-03-14 (×4): qty 2

## 2020-03-14 MED ORDER — POLYETHYLENE GLYCOL 3350 17 G PO PACK: 17.0000 g | PACK | Freq: Every day | ORAL | Status: DC | PRN

## 2020-03-14 MED ORDER — ASPIRIN EC 81 MG PO TBEC
81.0000 mg | DELAYED_RELEASE_TABLET | Freq: Every day | ORAL | Status: DC
  Administered 2020-03-14 – 2020-03-17 (×4): 81 mg via ORAL
  Filled 2020-03-14 (×4): qty 1

## 2020-03-14 MED ORDER — DEXAMETHASONE SODIUM PHOSPHATE 10 MG/ML IJ SOLN
6.0000 mg | INTRAMUSCULAR | Status: DC
  Administered 2020-03-14 – 2020-03-17 (×4): 6 mg via INTRAVENOUS
  Filled 2020-03-14 (×4): qty 1

## 2020-03-14 MED ORDER — ONDANSETRON HCL 4 MG PO TABS: 4.0000 mg | ORAL_TABLET | Freq: Four times a day (QID) | ORAL | Status: DC | PRN

## 2020-03-14 MED ORDER — ALBUTEROL SULFATE HFA 108 (90 BASE) MCG/ACT IN AERS: 2.0000 | INHALATION_SPRAY | Freq: Four times a day (QID) | RESPIRATORY_TRACT | Status: DC | PRN

## 2020-03-14 MED ORDER — ACETAMINOPHEN 325 MG PO TABS: 650.0000 mg | ORAL_TABLET | Freq: Four times a day (QID) | ORAL | Status: DC | PRN

## 2020-03-14 MED ORDER — ALBUTEROL SULFATE HFA 108 (90 BASE) MCG/ACT IN AERS
2.0000 | INHALATION_SPRAY | Freq: Two times a day (BID) | RESPIRATORY_TRACT | Status: DC
  Administered 2020-03-14 – 2020-03-17 (×6): 2 via RESPIRATORY_TRACT

## 2020-03-14 MED ORDER — INSULIN ASPART 100 UNIT/ML ~~LOC~~ SOLN
5.0000 [IU] | Freq: Three times a day (TID) | SUBCUTANEOUS | Status: DC
  Administered 2020-03-14 (×2): 5 [IU] via SUBCUTANEOUS

## 2020-03-14 MED ORDER — TRAMADOL HCL 50 MG PO TABS: 50.0000 mg | ORAL_TABLET | Freq: Four times a day (QID) | ORAL | Status: DC | PRN

## 2020-03-14 MED ORDER — INSULIN GLARGINE 100 UNIT/ML ~~LOC~~ SOLN
15.0000 [IU] | Freq: Every day | SUBCUTANEOUS | Status: DC
  Administered 2020-03-14 – 2020-03-16 (×3): 15 [IU] via SUBCUTANEOUS
  Filled 2020-03-14 (×4): qty 0.15

## 2020-03-14 MED ORDER — INSULIN ASPART 100 UNIT/ML ~~LOC~~ SOLN
8.0000 [IU] | Freq: Three times a day (TID) | SUBCUTANEOUS | Status: DC
  Administered 2020-03-14 – 2020-03-17 (×8): 8 [IU] via SUBCUTANEOUS

## 2020-03-14 MED ORDER — GUAIFENESIN-DM 100-10 MG/5ML PO SYRP: 10.0000 mL | ORAL_SOLUTION | ORAL | Status: DC | PRN

## 2020-03-14 MED ORDER — HYDROCOD POLST-CPM POLST ER 10-8 MG/5ML PO SUER: 5.0000 mL | Freq: Two times a day (BID) | ORAL | Status: DC | PRN

## 2020-03-14 MED ORDER — ONDANSETRON HCL 4 MG/2ML IJ SOLN
4.0000 mg | Freq: Four times a day (QID) | INTRAMUSCULAR | Status: DC | PRN
  Administered 2020-03-15: 4 mg via INTRAVENOUS
  Filled 2020-03-14: qty 2

## 2020-03-14 NOTE — Progress Notes (Signed)
PROGRESS NOTE   Marcus [name]  LNL:[id number] DOB: Dec 24, 1945 DOA: 03/13/2020 PCP: Doree Albee, MD   Chief Complaint  Patient presents with  . COVID Positive   Brief Admission History:  74 y.o. male, 3 of type 2 diabetes mellitus, Guillain-Barr syndrome, gout, GERD, essential hypertension, blue toes, A. fib/a flutter, presents to the ER with a chief complaint of weakness and poor p.o. intake.  Patient reports that he was recently discharged after hospitalization with Covid.  He has been weaker and weaker at home.  In the middle of the night he got up to go to the bathroom, and fell down.  Patient reports that he did not trip he did not have chest pain he did not have palpitations he did not have shortness of breath, but he just went down.  Patient reports that he remained on the floor until his wife woke up and found him, and called EMS.  Assessment & Plan:   Principal Problem:   Pneumonia due to COVID-19 virus Active Problems:   GOUT   AF (atrial fibrillation) (HCC)   Leg weakness   Type II diabetes mellitus, uncontrolled (HCC)   Essential hypertension, benign   Peripheral vascular disease ([place])   COVID-19 virus infection   Weakness   COVID toes  1. Pneumonia due to Covid 19 - Pt was fully treated during recent hospitalization. He completed course of remdesivir and dexamethasone.  He was readmitted with vague symptoms but mostly with weakness and fatigue.  Fell down at home.  He requires 2L [place] supplemental oxygen.   2. Acute respiratory failure with hypoxia - continue supplemental oxygen and follow.  3. Protein calorie malnutrition Moderate - consult to dietitian.  4. AKI - Treating with IV fluids.  Follow.  5. Covid Toes - He has good pedal pulses. Follow.  6. Type 2 Diabetes mellitus - he is having steroid induced hyperglycemia, we have titrated his insulin doses and therapy and following CBGs closely.  7. Chronic atrial fibrillation - he is rate controlled with  metoprolol but not anticoagulated presumably due to frequent falling and high fall risk.   DVT prophylaxis:  SCDs, SQ heparin  Code Status:  Full  Family Communication: call to wife unanswered Disposition:   Status is: Inpatient  Remains inpatient appropriate because:IV treatments appropriate due to intensity of illness or inability to take PO and Inpatient level of care appropriate due to severity of illness  Dispo: The patient is from: Home              Anticipated d/c is to: Home              Anticipated d/c date is: 1 day              Patient currently is not medically stable to d/c.  Consultants:     Procedures:     Antimicrobials:   ceftriaxone/azithromycin 8/29>>  Subjective: PT reports that he hasn't gotten out of bed yet, he is coughing a lot.  No chest pain.    Objective: Vitals:   03/14/20 0311 03/14/20 0647 03/14/20 0744 03/14/20 1348  BP: (!) 170/62 139/87  134/77  Pulse: 98 (!) 106  88  Resp: 17 18  18   Temp: 97.7 F (36.5 C) 98.6 F (37 C)  98.8 F (37.1 C)  TempSrc: Oral Skin  Oral  SpO2: 92% 97% 97%   Weight: 86.2 kg     Height: 6' 2"  (1.88 m)  Intake/Output Summary (Last 24 hours) at 03/14/2020 1440 Last data filed at 03/14/2020 0800 Gross per 24 hour  Intake 1202.51 ml  Output 200 ml  Net 1002.51 ml   Filed Weights   03/14/20 0311  Weight: 86.2 kg    Examination:  General exam: Appears calm and comfortable  Respiratory system: no increased WOB.  Cardiovascular system: S1 & S2 heard, RRR. No JVD, murmurs, rubs, gallops or clicks. No pedal edema. Gastrointestinal system: Abdomen is nondistended, soft and nontender. No organomegaly or masses felt. Normal bowel sounds heard. Central nervous system: Alert and oriented. No focal neurological deficits. Extremities: Symmetric 5 x 5 power. Skin: No rashes, lesions or ulcers Psychiatry: Judgement and insight appear normal. Mood & affect appropriate.   Data Reviewed: I have personally  reviewed following labs and imaging studies  CBC: Recent Labs  Lab 03/13/20 1803 03/14/20 0840  WBC 8.9 8.4  NEUTROABS 7.9* 7.9*  HGB 16.7 15.9  HCT 51.0 49.5  MCV 92.6 94.1  PLT 195 761    Basic Metabolic Panel: Recent Labs  Lab 03/13/20 1803 03/14/20 0840  NA 133* 134*  K 4.3 4.6  CL 103 103  CO2 19* 19*  GLUCOSE 231* 186*  BUN 47* 37*  CREATININE 1.57* 1.31*  CALCIUM 7.9* 7.8*  MG  --  2.0    GFR: Estimated Creatinine Clearance: 55.8 mL/min (A) (by C-G formula based on SCr of 1.31 mg/dL (H)).  Liver Function Tests: Recent Labs  Lab 03/13/20 1803 03/14/20 0840  AST 38 62*  ALT 36 47*  ALKPHOS 64 65  BILITOT 1.4* 1.1  PROT 5.9* 5.8*  ALBUMIN 2.5* 2.4*    CBG: Recent Labs  Lab 03/13/20 2231 03/14/20 0850 03/14/20 1217  GLUCAP 209* 166* 352*    Recent Results (from the past 240 hour(s))  Blood Culture (routine x 2)     Status: None (Preliminary result)   Collection Time: 03/13/20  5:05 PM   Specimen: Left Antecubital; Blood  Result Value Ref Range Status   Specimen Description LEFT ANTECUBITAL  Final   Special Requests   Final    BOTTLES DRAWN AEROBIC AND ANAEROBIC Blood Culture adequate volume   Culture   Final    NO GROWTH < 12 HOURS Performed at [facility], [address] [place], [place] [address]    Report Status PENDING  Incomplete  Blood Culture (routine x 2)     Status: None (Preliminary result)   Collection Time: 03/13/20  5:05 PM   Specimen: BLOOD  Result Value Ref Range Status   Specimen Description BLOOD  Final   Special Requests NONE  Final   Culture   Final    NO GROWTH < 12 HOURS Performed at [facility], [address], [place], [place] [address]    Report Status PENDING  Incomplete     Radiology Studies: CT ANGIO CHEST PE W OR WO CONTRAST  Result Date: 03/13/2020 CLINICAL DATA:  PE suspected, low/intermediate prob, positive D-dimer COVID positive 03/03/2020 EXAM: CT ANGIOGRAPHY CHEST WITH CONTRAST TECHNIQUE:  Multidetector CT imaging of the chest was performed using the standard protocol during bolus administration of intravenous contrast. Multiplanar CT image reconstructions and MIPs were obtained to evaluate the vascular anatomy. CONTRAST:  41m OMNIPAQUE IOHEXOL 350 MG/ML SOLN COMPARISON:  Radiograph earlier today. FINDINGS: Cardiovascular: There are no filling defects within the pulmonary arteries to suggest pulmonary embolus. Thoracic aorta is normal in caliber. Mild aortic atherosclerosis. Mild multi chamber cardiomegaly. No pericardial effusion. Scattered coronary artery  calcifications. Mediastinum/Nodes: Shotty mediastinal and hilar lymph nodes are all subcentimeter short axis, likely reactive. Tiny hiatal hernia. Patulous upper esophagus. No thyroid nodule. Lungs/Pleura: Patchy multifocal ill-defined and nodular ground-glass opacities within both lungs, pattern typical of COVID-19 pneumonia. Additional dependent atelectasis in the lower lobes. There are bilateral calcified pleural plaques. No pleural fluid. Trachea and central bronchi are patent. Upper Abdomen: No acute or unexpected finding.  Tiny hiatal hernia. Musculoskeletal: There are no acute or suspicious osseous abnormalities. Multilevel degenerative change in the spine. Review of the MIP images confirms the above findings. IMPRESSION: 1. No pulmonary embolus. 2. Patchy multifocal ill-defined and nodular ground-glass opacities within both lungs, pattern typical of COVID-19 pneumonia. Parenchymal involvement is mild. 3. A few scattered calcified pleural plaques consistent with prior asbestos exposure. 4. Mild cardiomegaly. Coronary artery calcifications. Aortic Atherosclerosis (ICD10-I70.0). Electronically Signed   By: Keith Rake M.D.   On: 03/13/2020 20:54   DG Chest Port 1 View  Result Date: 03/13/2020 CLINICAL DATA:  COVID positive with weakness. EXAM: PORTABLE CHEST 1 VIEW COMPARISON:  March 03, 2020 FINDINGS: Small ill-defined multifocal  infiltrates are seen along the periphery of both lungs, right greater than left. There is no evidence of a pleural effusion or pneumothorax. The cardiac silhouette is borderline in size. There is mild calcification of the aortic arch. The visualized skeletal structures are unremarkable. IMPRESSION: Mild multifocal infiltrates, right greater than left. Electronically Signed   By: Virgina Norfolk M.D.   On: 03/13/2020 17:36   Scheduled Meds: . albuterol  2 puff Inhalation BID  . aspirin EC  81 mg Oral Daily  . dexamethasone (DECADRON) injection  6 mg Intravenous Q24H  . [START ON 03/24/2020] dexamethasone  6 mg Oral Daily  . feeding supplement (ENSURE ENLIVE)  237 mL Oral BID BM  . heparin  5,000 Units Subcutaneous Q8H  . insulin aspart  0-20 Units Subcutaneous TID WC  . insulin aspart  0-5 Units Subcutaneous QHS  . insulin aspart  8 Units Subcutaneous TID WC  . insulin glargine  15 Units Subcutaneous Daily  . metoprolol tartrate  75 mg Oral BID  . multivitamin with minerals  1 tablet Oral Daily  . pravastatin  20 mg Oral Daily   Continuous Infusions: . sodium chloride 1,000 mL (03/14/20 1358)  . azithromycin Stopped (03/13/20 2358)  . cefTRIAXone (ROCEPHIN)  IV      LOS: 1 day   Time spent: 32 minutes   Welles Walthall Wynetta Emery, MD How to contact the [facility] Attending or Consulting provider [place] or covering provider during after hours [place], for this patient?  1. Check the care team in [facility] and look for a) attending/consulting [facility] provider listed and b) the [facility] team listed 2. Log into [email or web address] and use [place]'s universal password to access. If you do not have the password, please contact the hospital operator. 3. Locate the [facility] provider you are looking for under [facility] and page to a number that you can be directly reached. 4. If you still have difficulty reaching the provider, please page the [facility] (Director on Call) for the Hospitalists listed on amion for  assistance.  03/14/2020, 2:40 PM

## 2020-03-14 NOTE — ED Notes (Signed)
Pt consumed one cup of water and given another per request.

## 2020-03-14 NOTE — Evaluation (Signed)
Physical Therapy Evaluation Patient Details Name: Marcus [name] MRN: [id number] DOB: 1946/04/04 Today's Date: 03/14/2020   History of Present Illness  Marcus [name]  is a 74 y.o. male, 3 of type 2 diabetes mellitus, Guillain-Barr syndrome, gout, GERD, essential hypertension, blue toes, A. fib/a flutter, presents to the ER with a chief complaint of weakness and poor p.o. intake.  Patient reports that he was recently discharged after hospitalization with Covid.  He has been weaker and weaker at home.  In the middle of the night he got up to go to the bathroom, and fell down.  Patient reports that he did not trip he did not have chest pain he did not have palpitations he did not have shortness of breath, but he just went down.  Patient reports that he remained on the floor until his wife woke up and found him, and called EMS.  Patient thinks that he did hit his head during his fall but he did not have loss of consciousness.  He has had no headache, no change in vision or hearing.  Patient does have an abrasion on his forehead.  Patient reports that he has not been eating at home, and he cannot remember when his last normal meal was.  He denies constipation, diarrhea, abdominal pain.  He admits to some nausea, but no vomiting.  Patient reports that he has not felt short of breath.  He does not have oxygen at home.  The symptoms have been ongoing since March 05, 2020-his last discharge.  Patient remains somewhat confused, although he is alert and oriented x3.  Patient reports he is not sure if he has had a cough.  He also reports that he is only here because his wife made him come.  Although he cannot get up, or walk, he does not think he needs to be in the hospital.  This shows little insight into his own healthcare.    Clinical Impression  Pt admitted with above diagnosis. COVID+ patient. Patient eating his lunch in bed but agreeable to participating in evaluation today. Patient provided  subjective information. Patient requires min guard to minimal assistance for bed mobility, transfers and ambulation with IV pole and RW.  Patient required cuing for sequencing of steps and placement of hands with assistive device. Patient's home does have 14 stairs to the master bedroom but patient stated there is a bedroom on the main floor he can use if needed when he returns home. Pt currently with functional limitations due to the deficits listed below (see PT Problem List). Pt will benefit from skilled PT to increase their independence and safety with mobility to allow discharge to the venue listed below.       Follow Up Recommendations Home health PT;Supervision - Intermittent;Supervision for mobility/OOB    Equipment Recommendations  None recommended by PT    Recommendations for Other Services       Precautions / Restrictions Precautions Precautions: Fall Restrictions Weight Bearing Restrictions: No      Mobility  Bed Mobility Overal bed mobility: Needs Assistance Bed Mobility: Supine to Sit     Supine to sit: Min assist;HOB elevated     General bed mobility comments: assist for trunk and shifting hips forward to EOB  Transfers Overall transfer level: Needs assistance Equipment used: Rolling walker (2 wheeled) Transfers: Sit to/from Stand Sit to Stand: Min guard         General transfer comment: extra time and multiple attempts to power up  Ambulation/Gait  Ambulation/Gait assistance: Min guard Gait Distance (Feet): 20 Feet Assistive device: IV Pole Gait Pattern/deviations: Step-through pattern;Decreased step length - right;Decreased step length - left;Decreased stride length;Trunk flexed;Narrow base of support Gait velocity: decreased   General Gait Details: reaching for objects with left hand while pushing IV pole with right, on RA, limited by fatigue; no complaints of shortness of breath or dizziness  Stairs            Wheelchair Mobility     Modified Rankin (Stroke Patients Only)       Balance Overall balance assessment: Needs assistance Sitting-balance support: Bilateral upper extremity supported;Feet supported Sitting balance-Leahy Scale: Fair     Standing balance support: Single extremity supported;During functional activity Standing balance-Leahy Scale: Fair Standing balance comment: fair with IV pole                             Pertinent Vitals/Pain Pain Assessment: No/denies pain    Home Living Family/patient expects to be discharged to:: (P) Private residence Living Arrangements: (P) Spouse/significant other Available Help at Discharge: (P) Family;Available 24 hours/day Type of Home: (P) House Home Access: (P) Ramped entrance     Home Layout: (P) Two level;Able to live on main level with bedroom/bathroom Home Equipment: (P) Grab bars - tub/shower;Grab bars - toilet;Cane - single point;Walker - 2 wheels      Prior Function                 Hand Dominance        Extremity/Trunk Assessment   Upper Extremity Assessment Upper Extremity Assessment: Generalized weakness    Lower Extremity Assessment Lower Extremity Assessment: Generalized weakness    Cervical / Trunk Assessment Cervical / Trunk Assessment: Normal  Communication      Cognition Arousal/Alertness: Awake/alert Behavior During Therapy: WFL for tasks assessed/performed Overall Cognitive Status: Within Functional Limits for tasks assessed                                        General Comments      Exercises     Assessment/Plan    PT Assessment Patient needs continued PT services  PT Problem List Decreased strength;Decreased mobility;Decreased activity tolerance;Decreased balance;Decreased knowledge of use of DME       PT Treatment Interventions DME instruction;Therapeutic activities;Gait training;Therapeutic exercise;Patient/family education;Balance training    PT Goals (Current goals  can be found in the Care Plan section)  Acute Rehab PT Goals Patient Stated Goal: Get stronger again. PT Goal Formulation: With patient Time For Goal Achievement: 03/28/20 Potential to Achieve Goals: Fair    Frequency Min 3X/week   Barriers to discharge        Co-evaluation               AM-PAC PT "6 Clicks" Mobility  Outcome Measure Help needed turning from your back to your side while in a flat bed without using bedrails?: A Little Help needed moving from lying on your back to sitting on the side of a flat bed without using bedrails?: A Lot Help needed moving to and from a bed to a chair (including a wheelchair)?: A Little Help needed standing up from a chair using your arms (e.g., wheelchair or bedside chair)?: A Little Help needed to walk in hospital room?: A Little Help needed climbing 3-5 steps with a railing? : A Lot  6 Click Score: 16    End of Session Equipment Utilized During Treatment: Gait belt Activity Tolerance: Patient tolerated treatment well;Patient limited by fatigue Patient left: in chair;with call bell/phone within reach Nurse Communication: Mobility status PT Visit Diagnosis: Unsteadiness on feet (R26.81);Muscle weakness (generalized) (M62.81);Other abnormalities of gait and mobility (R26.89);History of falling (Z91.81)    Time: 1250-1320 PT Time Calculation (min) (ACUTE ONLY): 30 min   Charges:   PT Evaluation $PT Eval Moderate Complexity: 1 Mod PT Treatments $Therapeutic Activity: 8-22 mins        Floria Raveling. Hartnett-Rands, MS, PT Per [place] [phone] 03/14/2020, 1:30 PM

## 2020-03-14 NOTE — Plan of Care (Signed)
  Problem: Education: Goal: Knowledge of risk factors and measures for prevention of condition will improve Outcome: Progressing   Problem: Coping: Goal: Psychosocial and spiritual needs will be supported Outcome: Progressing   Problem: Respiratory: Goal: Will maintain a patent airway Outcome: Progressing Goal: Complications related to the disease process, condition or treatment will be avoided or minimized Outcome: Progressing   

## 2020-03-14 NOTE — Plan of Care (Signed)
  Problem: Acute Rehab PT Goals(only PT should resolve) Goal: Pt will Roll Supine to Side Outcome: Progressing Flowsheets (Taken 03/14/2020 1335) Pt will Roll Supine to Side: min guard Goal: Pt Will Go Supine/Side To Sit Outcome: Progressing Flowsheets (Taken 03/14/2020 1335) Pt will go Supine/Side to Sit: with minimal assist Goal: Pt Will Go Sit To Supine/Side Outcome: Progressing Flowsheets (Taken 03/14/2020 1335) Pt will go Sit to Supine/Side: with minimal assist Goal: Patient Will Transfer Sit To/From Stand Outcome: Progressing Flowsheets (Taken 03/14/2020 1335) Patient will transfer sit to/from stand: with min guard assist Goal: Pt Will Transfer Bed To Chair/Chair To Bed Outcome: Progressing Flowsheets (Taken 03/14/2020 1335) Pt will Transfer Bed to Chair/Chair to Bed: min guard assist Goal: Pt Will Ambulate Outcome: Progressing Flowsheets (Taken 03/14/2020 1335) Pt will Ambulate:  50 feet  with min guard assist  with rolling walker   Pamala Hurry D. Hartnett-Rands, MS, PT Per [place] [phone] 03/14/2020

## 2020-03-14 NOTE — Care Management Important Message (Signed)
Important Message  Patient Details  Name: Marcus [name] MRN: [id number] Date of Birth: 1946/02/02   Medicare Important Message Given:  Yes - Important Message mailed due to current National Emergency     Tommy Medal 03/14/2020, 3:51 PM

## 2020-03-15 LAB — FERRITIN: Ferritin: 3788 ng/mL — ABNORMAL HIGH (ref 24–336)

## 2020-03-15 LAB — MAGNESIUM: Magnesium: 1.8 mg/dL (ref 1.7–2.4)

## 2020-03-15 LAB — CBC WITH DIFFERENTIAL/PLATELET
Abs Immature Granulocytes: 0.03 10*3/uL (ref 0.00–0.07)
Basophils Absolute: 0 10*3/uL (ref 0.0–0.1)
Basophils Relative: 0 %
Eosinophils Absolute: 0 10*3/uL (ref 0.0–0.5)
Eosinophils Relative: 0 %
HCT: 42.4 % (ref 39.0–52.0)
Hemoglobin: 14 g/dL (ref 13.0–17.0)
Immature Granulocytes: 0 %
Lymphocytes Relative: 7 %
Lymphs Abs: 0.5 10*3/uL — ABNORMAL LOW (ref 0.7–4.0)
MCH: 30.6 pg (ref 26.0–34.0)
MCHC: 33 g/dL (ref 30.0–36.0)
MCV: 92.6 fL (ref 80.0–100.0)
Monocytes Absolute: 0.2 10*3/uL (ref 0.1–1.0)
Monocytes Relative: 3 %
Neutro Abs: 7.3 10*3/uL (ref 1.7–7.7)
Neutrophils Relative %: 90 %
Platelets: 158 10*3/uL (ref 150–400)
RBC: 4.58 MIL/uL (ref 4.22–5.81)
RDW: 13.7 % (ref 11.5–15.5)
WBC: 8.1 10*3/uL (ref 4.0–10.5)
nRBC: 0 % (ref 0.0–0.2)

## 2020-03-15 LAB — COMPREHENSIVE METABOLIC PANEL
ALT: 63 U/L — ABNORMAL HIGH (ref 0–44)
AST: 82 U/L — ABNORMAL HIGH (ref 15–41)
Albumin: 2 g/dL — ABNORMAL LOW (ref 3.5–5.0)
Alkaline Phosphatase: 61 U/L (ref 38–126)
Anion gap: 8 (ref 5–15)
BUN: 37 mg/dL — ABNORMAL HIGH (ref 8–23)
CO2: 20 mmol/L — ABNORMAL LOW (ref 22–32)
Calcium: 7.5 mg/dL — ABNORMAL LOW (ref 8.9–10.3)
Chloride: 105 mmol/L (ref 98–111)
Creatinine, Ser: 1.2 mg/dL (ref 0.61–1.24)
GFR calc Af Amer: >60 mL/min (ref >60)
GFR calc non Af Amer: 58 mL/min — ABNORMAL LOW (ref >60)
Glucose, Bld: 170 mg/dL — ABNORMAL HIGH (ref 70–99)
Potassium: 4.1 mmol/L (ref 3.5–5.1)
Sodium: 133 mmol/L — ABNORMAL LOW (ref 135–145)
Total Bilirubin: 0.5 mg/dL (ref 0.3–1.2)
Total Protein: 5 g/dL — ABNORMAL LOW (ref 6.5–8.1)

## 2020-03-15 LAB — GLUCOSE, CAPILLARY
Glucose-Capillary: 192 mg/dL — ABNORMAL HIGH (ref 70–99)
Glucose-Capillary: 287 mg/dL — ABNORMAL HIGH (ref 70–99)
Glucose-Capillary: 93 mg/dL (ref 70–99)
Glucose-Capillary: 207 mg/dL — ABNORMAL HIGH (ref 70–99)

## 2020-03-15 LAB — D-DIMER, QUANTITATIVE: D-Dimer, Quant: 2.31 ug/mL-FEU — ABNORMAL HIGH (ref 0.00–0.50)

## 2020-03-15 LAB — PROCALCITONIN: Procalcitonin: 1.37 ng/mL

## 2020-03-15 LAB — C-REACTIVE PROTEIN: CRP: 16.7 mg/dL — ABNORMAL HIGH (ref <1.0)

## 2020-03-15 NOTE — Progress Notes (Signed)
PROGRESS NOTE   Marcus [name]  OBS:[id number] DOB: Mar 05, 1946 DOA: 03/13/2020 PCP: Doree Albee, MD   Chief Complaint  Patient presents with  . COVID Positive   Brief Admission History:  74 y.o. male, 3 of type 2 diabetes mellitus, Guillain-Barr syndrome, gout, GERD, essential hypertension, blue toes, A. fib/a flutter, presents to the ER with a chief complaint of weakness and poor p.o. intake.  Patient reports that he was recently discharged after hospitalization with Covid.  He has been weaker and weaker at home.  In the middle of the night he got up to go to the bathroom, and fell down.  Patient reports that he did not trip he did not have chest pain he did not have palpitations he did not have shortness of breath, but he just went down.  Patient reports that he remained on the floor until his wife woke up and found him, and called EMS.  Assessment & Plan:   Principal Problem:   Pneumonia due to COVID-19 virus Active Problems:   GOUT   AF (atrial fibrillation) (HCC)   Leg weakness   Type II diabetes mellitus, uncontrolled (HCC)   Essential hypertension, benign   Peripheral vascular disease ([place])   COVID-19 virus infection   Weakness   COVID toes  1. Pneumonia due to Covid 19 - Pt was fully treated during recent hospitalization. He completed course of remdesivir and dexamethasone.  He was readmitted with vague symptoms but mostly with weakness and fatigue.  Fell down at home.  He requires 1-4L/min [place] supplemental oxygen. Wean oxygen as able.   2. Acute respiratory failure with hypoxia - continue supplemental oxygen and follow.  3. Protein calorie malnutrition Moderate - consult to dietitian.  4. AKI - Treating with IV fluids.  Follow.  5. Covid Toes - He has good pedal pulses. Follow.  6. Type 2 Diabetes mellitus - he is having steroid induced hyperglycemia, we have titrated his insulin doses and therapy and following CBGs closely.  7. Chronic atrial fibrillation -  he is rate controlled with metoprolol but not anticoagulated presumably due to frequent falling and high fall risk.   DVT prophylaxis:  SCDs, SQ heparin  Code Status:  Full  Family Communication: called wife [name] by telephone 8/21 Disposition:   Status is: Inpatient  Remains inpatient appropriate because:IV treatments appropriate due to intensity of illness or inability to take PO and Inpatient level of care appropriate due to severity of illness  Dispo: The patient is from: Home              Anticipated d/c is to: Home              Anticipated d/c date is: 1 day              Patient currently is not medically stable to d/c.  Consultants:     Procedures:     Antimicrobials:   ceftriaxone/azithromycin 8/29>>  Subjective: Pt vomited his breakfast.  He is very weak and requires assistance to get to the chair.  He is SOB but says that the oxygen is helping.     Objective: Vitals:   03/14/20 2226 03/15/20 0711 03/15/20 0812 03/15/20 1500  BP: 125/64 (!) 103/59  119/83  Pulse: (!) 117 87  76  Resp: 18 20  20   Temp: 98.7 F (37.1 C)   98 F (36.7 C)  TempSrc: Oral   Oral  SpO2: 94% 95% 92% 94%  Weight:  Height:        Intake/Output Summary (Last 24 hours) at 03/15/2020 1535 Last data filed at 03/15/2020 1229 Gross per 24 hour  Intake 2640.22 ml  Output 1250 ml  Net 1390.22 ml   Filed Weights   03/14/20 0311  Weight: 86.2 kg   Examination:  General exam: Appears calm and comfortable  Respiratory system: no increased WOB.  Cardiovascular system: S1 & S2 heard, RRR. No JVD, murmurs, rubs, gallops or clicks. No pedal edema. Gastrointestinal system: Abdomen is nondistended, soft and nontender. No organomegaly or masses felt. Normal bowel sounds heard. Central nervous system: Alert and oriented. No focal neurological deficits. Extremities: Symmetric 5 x 5 power. Covid toes.  Cyanotic toes.  Good pedal pulses in feet and warm.  Skin: No rashes, lesions or  ulcers Psychiatry: Judgement and insight appear normal. Mood & affect appropriate.   Data Reviewed: I have personally reviewed following labs and imaging studies  CBC: Recent Labs  Lab 03/13/20 1803 03/14/20 0840 03/15/20 0613  WBC 8.9 8.4 8.1  NEUTROABS 7.9* 7.9* 7.3  HGB 16.7 15.9 14.0  HCT 51.0 49.5 42.4  MCV 92.6 94.1 92.6  PLT 195 181 024    Basic Metabolic Panel: Recent Labs  Lab 03/13/20 1803 03/14/20 0840 03/15/20 0613  NA 133* 134* 133*  K 4.3 4.6 4.1  CL 103 103 105  CO2 19* 19* 20*  GLUCOSE 231* 186* 170*  BUN 47* 37* 37*  CREATININE 1.57* 1.31* 1.20  CALCIUM 7.9* 7.8* 7.5*  MG  --  2.0 1.8    GFR: Estimated Creatinine Clearance: 60.9 mL/min (by C-G formula based on SCr of 1.2 mg/dL).  Liver Function Tests: Recent Labs  Lab 03/13/20 1803 03/14/20 0840 03/15/20 0613  AST 38 62* 82*  ALT 36 47* 63*  ALKPHOS 64 65 61  BILITOT 1.4* 1.1 0.5  PROT 5.9* 5.8* 5.0*  ALBUMIN 2.5* 2.4* 2.0*    CBG: Recent Labs  Lab 03/14/20 1217 03/14/20 1634 03/14/20 2221 03/15/20 0755 03/15/20 1122  GLUCAP 352* 262* 104* 192* 287*    Recent Results (from the past 240 hour(s))  Blood Culture (routine x 2)     Status: None (Preliminary result)   Collection Time: 03/13/20  5:05 PM   Specimen: Left Antecubital; Blood  Result Value Ref Range Status   Specimen Description LEFT ANTECUBITAL  Final   Special Requests   Final    BOTTLES DRAWN AEROBIC AND ANAEROBIC Blood Culture adequate volume   Culture   Final    NO GROWTH 2 DAYS Performed at [facility], [address] [place], [place] [address]    Report Status PENDING  Incomplete  Blood Culture (routine x 2)     Status: None (Preliminary result)   Collection Time: 03/13/20  5:05 PM   Specimen: BLOOD  Result Value Ref Range Status   Specimen Description BLOOD  Final   Special Requests NONE  Final   Culture   Final    NO GROWTH 2 DAYS Performed at [facility], [address], [place], [place] [address]     Report Status PENDING  Incomplete     Radiology Studies: CT ANGIO CHEST PE W OR WO CONTRAST  Result Date: 03/13/2020 CLINICAL DATA:  PE suspected, low/intermediate prob, positive D-dimer COVID positive 03/03/2020 EXAM: CT ANGIOGRAPHY CHEST WITH CONTRAST TECHNIQUE: Multidetector CT imaging of the chest was performed using the standard protocol during bolus administration of intravenous contrast. Multiplanar CT image reconstructions and MIPs were obtained to evaluate  the vascular anatomy. CONTRAST:  23m OMNIPAQUE IOHEXOL 350 MG/ML SOLN COMPARISON:  Radiograph earlier today. FINDINGS: Cardiovascular: There are no filling defects within the pulmonary arteries to suggest pulmonary embolus. Thoracic aorta is normal in caliber. Mild aortic atherosclerosis. Mild multi chamber cardiomegaly. No pericardial effusion. Scattered coronary artery calcifications. Mediastinum/Nodes: Shotty mediastinal and hilar lymph nodes are all subcentimeter short axis, likely reactive. Tiny hiatal hernia. Patulous upper esophagus. No thyroid nodule. Lungs/Pleura: Patchy multifocal ill-defined and nodular ground-glass opacities within both lungs, pattern typical of COVID-19 pneumonia. Additional dependent atelectasis in the lower lobes. There are bilateral calcified pleural plaques. No pleural fluid. Trachea and central bronchi are patent. Upper Abdomen: No acute or unexpected finding.  Tiny hiatal hernia. Musculoskeletal: There are no acute or suspicious osseous abnormalities. Multilevel degenerative change in the spine. Review of the MIP images confirms the above findings. IMPRESSION: 1. No pulmonary embolus. 2. Patchy multifocal ill-defined and nodular ground-glass opacities within both lungs, pattern typical of COVID-19 pneumonia. Parenchymal involvement is mild. 3. A few scattered calcified pleural plaques consistent with prior asbestos exposure. 4. Mild cardiomegaly. Coronary artery calcifications. Aortic Atherosclerosis  (ICD10-I70.0). Electronically Signed   By: MKeith RakeM.D.   On: 03/13/2020 20:54   DG Chest Port 1 View  Result Date: 03/13/2020 CLINICAL DATA:  COVID positive with weakness. EXAM: PORTABLE CHEST 1 VIEW COMPARISON:  March 03, 2020 FINDINGS: Small ill-defined multifocal infiltrates are seen along the periphery of both lungs, right greater than left. There is no evidence of a pleural effusion or pneumothorax. The cardiac silhouette is borderline in size. There is mild calcification of the aortic arch. The visualized skeletal structures are unremarkable. IMPRESSION: Mild multifocal infiltrates, right greater than left. Electronically Signed   By: TVirgina NorfolkM.D.   On: 03/13/2020 17:36   Scheduled Meds: . albuterol  2 puff Inhalation BID  . aspirin EC  81 mg Oral Daily  . dexamethasone (DECADRON) injection  6 mg Intravenous Q24H  . [START ON 03/24/2020] dexamethasone  6 mg Oral Daily  . feeding supplement (ENSURE ENLIVE)  237 mL Oral BID BM  . heparin  5,000 Units Subcutaneous Q8H  . insulin aspart  0-20 Units Subcutaneous TID WC  . insulin aspart  0-5 Units Subcutaneous QHS  . insulin aspart  8 Units Subcutaneous TID WC  . insulin glargine  15 Units Subcutaneous Daily  . metoprolol tartrate  75 mg Oral BID  . multivitamin with minerals  1 tablet Oral Daily  . pravastatin  20 mg Oral Daily   Continuous Infusions: . sodium chloride 1,000 mL (03/14/20 2354)  . azithromycin 500 mg (03/14/20 2235)  . cefTRIAXone (ROCEPHIN)  IV 1 g (03/14/20 2232)    LOS: 2 days   Time spent: 30 minutes   Jatavious Peppard JWynetta Emery MD How to contact the TCollege Park Endoscopy Center LLCAttending or Consulting provider [place] covering provider during after hours [place] for this patient?  1. Check the care team in [facility] look for a) attending/consulting TR[facility] provider listed and b) the TPhysicians [facility]ulatory Surgery Center LLCteam listed 2. Log into [email or web address] and use [place]'s universal password to access. If you do not have the password, please contact the  hospital operator. 3. Locate the TSt [facility] Hospital Incprovider you are looking for under [facility] and page to a number that you can be directly reached. 4. If you still have difficulty reaching the provider, please page the [facility](Director on Call) for the Hospitalists listed on amion for assistance.  03/15/2020, 3:35 PM

## 2020-03-15 NOTE — Plan of Care (Signed)
  Problem: Education: Goal: Knowledge of risk factors and measures for prevention of condition will improve Outcome: Progressing   Problem: Coping: Goal: Psychosocial and spiritual needs will be supported Outcome: Progressing   Problem: Respiratory: Goal: Will maintain a patent airway Outcome: Progressing Goal: Complications related to the disease process, condition or treatment will be avoided or minimized Outcome: Progressing   

## 2020-03-16 LAB — GLUCOSE, CAPILLARY
Glucose-Capillary: 225 mg/dL — ABNORMAL HIGH (ref 70–99)
Glucose-Capillary: 340 mg/dL — ABNORMAL HIGH (ref 70–99)
Glucose-Capillary: 111 mg/dL — ABNORMAL HIGH (ref 70–99)
Glucose-Capillary: 85 mg/dL (ref 70–99)
Glucose-Capillary: 112 mg/dL — ABNORMAL HIGH (ref 70–99)

## 2020-03-16 LAB — CBC WITH DIFFERENTIAL/PLATELET
Abs Immature Granulocytes: 0.04 10*3/uL (ref 0.00–0.07)
Basophils Absolute: 0 10*3/uL (ref 0.0–0.1)
Basophils Relative: 0 %
Eosinophils Absolute: 0 10*3/uL (ref 0.0–0.5)
Eosinophils Relative: 0 %
HCT: 41.8 % (ref 39.0–52.0)
Hemoglobin: 13.7 g/dL (ref 13.0–17.0)
Immature Granulocytes: 1 %
Lymphocytes Relative: 6 %
Lymphs Abs: 0.5 10*3/uL — ABNORMAL LOW (ref 0.7–4.0)
MCH: 30.5 pg (ref 26.0–34.0)
MCHC: 32.8 g/dL (ref 30.0–36.0)
MCV: 93.1 fL (ref 80.0–100.0)
Monocytes Absolute: 0.2 10*3/uL (ref 0.1–1.0)
Monocytes Relative: 3 %
Neutro Abs: 6.8 10*3/uL (ref 1.7–7.7)
Neutrophils Relative %: 90 %
Platelets: 143 10*3/uL — ABNORMAL LOW (ref 150–400)
RBC: 4.49 MIL/uL (ref 4.22–5.81)
RDW: 13.8 % (ref 11.5–15.5)
WBC: 7.6 10*3/uL (ref 4.0–10.5)
nRBC: 0 % (ref 0.0–0.2)

## 2020-03-16 LAB — COMPREHENSIVE METABOLIC PANEL WITH GFR
ALT: 133 U/L — ABNORMAL HIGH (ref 0–44)
AST: 143 U/L — ABNORMAL HIGH (ref 15–41)
Albumin: 2 g/dL — ABNORMAL LOW (ref 3.5–5.0)
Alkaline Phosphatase: 77 U/L (ref 38–126)
Anion gap: 9 (ref 5–15)
BUN: 36 mg/dL — ABNORMAL HIGH (ref 8–23)
CO2: 20 mmol/L — ABNORMAL LOW (ref 22–32)
Calcium: 7.8 mg/dL — ABNORMAL LOW (ref 8.9–10.3)
Chloride: 106 mmol/L (ref 98–111)
Creatinine, Ser: 1.1 mg/dL (ref 0.61–1.24)
GFR calc Af Amer: >60 mL/min
GFR calc non Af Amer: >60 mL/min
Glucose, Bld: 224 mg/dL — ABNORMAL HIGH (ref 70–99)
Potassium: 4.4 mmol/L (ref 3.5–5.1)
Sodium: 135 mmol/L (ref 135–145)
Total Bilirubin: 0.5 mg/dL (ref 0.3–1.2)
Total Protein: 5 g/dL — ABNORMAL LOW (ref 6.5–8.1)

## 2020-03-16 LAB — PROCALCITONIN: Procalcitonin: 0.71 ng/mL

## 2020-03-16 LAB — C-REACTIVE PROTEIN: CRP: 8.5 mg/dL — ABNORMAL HIGH (ref <1.0)

## 2020-03-16 LAB — D-DIMER, QUANTITATIVE: D-Dimer, Quant: 2.64 ug/mL-FEU — ABNORMAL HIGH (ref 0.00–0.50)

## 2020-03-16 LAB — FERRITIN: Ferritin: 3667 ng/mL — ABNORMAL HIGH (ref 24–336)

## 2020-03-16 LAB — MAGNESIUM: Magnesium: 1.8 mg/dL (ref 1.7–2.4)

## 2020-03-16 MED ORDER — INSULIN GLARGINE 100 UNIT/ML ~~LOC~~ SOLN
20.0000 [IU] | Freq: Every day | SUBCUTANEOUS | Status: DC
  Administered 2020-03-17: 20 [IU] via SUBCUTANEOUS
  Filled 2020-03-16 (×2): qty 0.2

## 2020-03-16 MED ORDER — AZITHROMYCIN 250 MG PO TABS
500.0000 mg | ORAL_TABLET | Freq: Every day | ORAL | Status: DC
  Administered 2020-03-16: 500 mg via ORAL
  Filled 2020-03-16: qty 2

## 2020-03-16 MED ORDER — SODIUM CHLORIDE 0.9 % IV SOLN
1000.0000 mL | INTRAVENOUS | Status: DC
  Administered 2020-03-16 (×2): 1000 mL via INTRAVENOUS

## 2020-03-16 NOTE — NC FL2 (Signed)
[place] LEVEL OF CARE SCREENING TOOL     IDENTIFICATION  Patient Name: Marcus [name] Birthdate: 1945/08/18 Sex: male Admission Date (Current Location): 03/13/2020  [facility] and [place] Number:  [employer] and Address:  [facility] [address] [place]      Provider Number: [id number]  Attending Physician Name and Address:  Murlean Iba, MD  Relative Name and Phone Number:       Current Level of Care: Hospital Recommended Level of Care: [place] Prior Approval Number:    Date Approved/Denied:   PASRR Number: [id number]  Discharge Plan: SNF    Current Diagnoses: Patient Active Problem List   Diagnosis Date Noted  . Weakness 03/14/2020  . COVID toes 03/14/2020  . AKI (acute kidney injury) ([place])   . Pneumonia due to COVID-19 virus 03/04/2020  . Acute metabolic encephalopathy 60/63/0160  . COVID-19 virus infection 03/03/2020  . History of colonic polyps 08/21/2019  . Peripheral vascular disease ([place]) 08/01/2019  . Type II diabetes mellitus, uncontrolled ([place]) 04/23/2019  . Essential hypertension, benign 04/23/2019  . C. difficile colitis 11/09/2017  . Focal neurological deficit 07/21/2013  . GBS (Guillain Barre syndrome) ([place]) 07/21/2013  . Leg weakness 07/21/2013  . Femur fracture ([place]) 06/25/2013  . Femur fracture, right ([place]) 06/25/2013  . GOUT 07/31/2009  . AF (atrial fibrillation) ([place]) 07/31/2009  . Atrial flutter ([place]) 07/31/2009    Orientation RESPIRATION BLADDER Height & Weight     Self, Time, Situation, Place  O2 (see dc summary) Continent Weight: 190 lb (86.2 kg) Height:  6' 2"  (188 cm)  BEHAVIORAL SYMPTOMS/MOOD NEUROLOGICAL BOWEL NUTRITION STATUS      Continent Diet (see dc summary)  AMBULATORY STATUS COMMUNICATION OF NEEDS Skin   Extensive Assist Verbally Normal                       Personal Care Assistance Level of Assistance  Bathing, Feeding,  Dressing Bathing Assistance: Limited assistance Feeding assistance: Independent Dressing Assistance: Limited assistance     Functional Limitations Info  Sight, Hearing, Speech Sight Info: Adequate Hearing Info: Adequate Speech Info: Adequate    SPECIAL CARE FACTORS FREQUENCY  PT (By licensed PT), OT (By licensed OT)     PT Frequency: 5x week OT Frequency: 3x week            Contractures Contractures Info: Not present    Additional Factors Info  Code Status, Allergies Code Status Info: Full Allergies Info: Influenza Vaccines           Current Medications (03/16/2020):  This is the current hospital active medication list Current Facility-Administered Medications  Medication Dose Route Frequency Provider Last Rate Last Admin  . 0.9 %  sodium chloride infusion  1,000 mL Intravenous Continuous Zierle-Ghosh, Asia B, DO 100 mL/hr at 03/16/20 0909 1,000 mL at 03/16/20 0909  . acetaminophen (TYLENOL) tablet 650 mg  650 mg Oral Q6H PRN Zierle-Ghosh, Asia B, DO      . albuterol (VENTOLIN HFA) 108 (90 Base) MCG/ACT inhaler 2 puff  2 puff Inhalation BID Wynetta Emery, Clanford L, MD   2 puff at 03/16/20 0746  . albuterol (VENTOLIN HFA) 108 (90 Base) MCG/ACT inhaler 2 puff  2 puff Inhalation Q6H PRN Johnson, Clanford L, MD      . aspirin EC tablet 81 mg  81 mg Oral Daily Zierle-Ghosh, Asia B, DO   81 mg at 03/16/20 0912  .  azithromycin (ZITHROMAX) 500 mg in sodium chloride 0.9 % 250 mL IVPB  500 mg Intravenous Q24H Zierle-Ghosh, Asia B, DO 250 mL/hr at 03/15/20 2221 500 mg at 03/15/20 2221  . cefTRIAXone (ROCEPHIN) 1 g in sodium chloride 0.9 % 100 mL IVPB  1 g Intravenous Q24H Zierle-Ghosh, Asia B, DO 200 mL/hr at 03/15/20 2100 1 g at 03/15/20 2100  . chlorpheniramine-HYDROcodone (TUSSIONEX) 10-8 MG/5ML suspension 5 mL  5 mL Oral Q12H PRN Zierle-Ghosh, Asia B, DO      . dexamethasone (DECADRON) injection 6 mg  6 mg Intravenous Q24H Zierle-Ghosh, Asia B, DO   6 mg at 03/16/20 0211  . feeding  supplement (ENSURE ENLIVE) (ENSURE ENLIVE) liquid 237 mL  237 mL Oral BID BM Zierle-Ghosh, Asia B, DO   237 mL at 03/16/20 0915  . guaiFENesin-dextromethorphan (ROBITUSSIN DM) 100-10 MG/5ML syrup 10 mL  10 mL Oral Q4H PRN Zierle-Ghosh, Asia B, DO      . heparin injection 5,000 Units  5,000 Units Subcutaneous Q8H Zierle-Ghosh, Asia B, DO   5,000 Units at 03/16/20 4097  . insulin aspart (novoLOG) injection 0-20 Units  0-20 Units Subcutaneous TID WC Zierle-Ghosh, Asia B, DO   15 Units at 03/16/20 1234  . insulin aspart (novoLOG) injection 0-5 Units  0-5 Units Subcutaneous QHS Zierle-Ghosh, Asia B, DO   2 Units at 03/15/20 2100  . insulin aspart (novoLOG) injection 8 Units  8 Units Subcutaneous TID WC Johnson, Clanford L, MD   8 Units at 03/16/20 1234  . [START ON 03/17/2020] insulin glargine (LANTUS) injection 20 Units  20 Units Subcutaneous Daily Johnson, Clanford L, MD      . metoprolol tartrate (LOPRESSOR) tablet 75 mg  75 mg Oral BID Zierle-Ghosh, Asia B, DO   75 mg at 03/16/20 3532  . multivitamin with minerals tablet 1 tablet  1 tablet Oral Daily Zierle-Ghosh, Asia B, DO   1 tablet at 03/16/20 0912  . ondansetron (ZOFRAN) tablet 4 mg  4 mg Oral Q6H PRN Zierle-Ghosh, Asia B, DO       Or  . ondansetron (ZOFRAN) injection 4 mg  4 mg Intravenous Q6H PRN Zierle-Ghosh, Asia B, DO   4 mg at 03/15/20 1157  . polyethylene glycol (MIRALAX / GLYCOLAX) packet 17 g  17 g Oral Daily PRN Zierle-Ghosh, Asia B, DO      . pravastatin (PRAVACHOL) tablet 20 mg  20 mg Oral Daily Zierle-Ghosh, Asia B, DO   20 mg at 03/16/20 0912  . traMADol (ULTRAM) tablet 50 mg  50 mg Oral Q6H PRN Zierle-Ghosh, Asia B, DO         Discharge Medications: Please see discharge summary for a list of discharge medications.  Relevant Imaging Results:  Relevant Lab Results:   Additional Information SSN: [place], [place]

## 2020-03-16 NOTE — TOC Initial Note (Signed)
Transition of Care [facility]) - Initial/Assessment Note    Patient Details  Name: Marcus [name] MRN: [id number] Date of Birth: October 21, 1945  [facility] CM/SW Contact:    Shade Flood, LCSW Phone Number: 03/16/2020, 2:53 PM  Clinical Narrative:                  Pt admitted from home. He lives with his wife who also has Covid. Pt currently on 4L O2. He was not on O2 at baseline. Spoke with pt today about dc planning. Offered SNF rehab referral and pt agreeable. Discussed available options for facilities accepting pt's with Covid. Pt expressed understanding.  Will refer today. Assigned TOC will follow up tomorrow to further assist with bed selection and further dc planning.  Expected Discharge Plan: Skilled Nursing Facility Barriers to Discharge: Continued Medical Work up, SNF Pending bed offer   Patient Goals and CMS Choice Patient states their goals for this hospitalization and ongoing recovery are:: get better CMS Medicare.gov Compare Post Acute Care list provided to:: Patient Choice offered to / list presented to : Patient  Expected Discharge Plan and Services Expected Discharge Plan: [place] In-house Referral: Clinical Social Work   Post Acute Care Choice: [place] Living arrangements for the past 2 months: [place]                                      Prior Living Arrangements/Services Living arrangements for the past 2 months: Single Family Home Lives with:: Spouse Patient language and need for interpreter reviewed:: Yes Do you feel safe going back to the place where you live?: Yes      Need for Family Participation in Patient Care: Yes (Comment) Care giver support system in place?: No (comment)   Criminal Activity/Legal Involvement Pertinent to Current Situation/Hospitalization: No - Comment as needed  Activities of Daily Living Home Assistive Devices/Equipment: None ADL Screening (condition at  time of admission) Patient's cognitive ability adequate to safely complete daily activities?: Yes Is the patient deaf or have difficulty hearing?: No Does the patient have difficulty seeing, even when wearing glasses/contacts?: No Does the patient have difficulty concentrating, remembering, or making decisions?: No Patient able to express need for assistance with ADLs?: Yes Does the patient have difficulty dressing or bathing?: Yes Independently performs ADLs?: No Communication: Needs assistance Is this a change from baseline?: Pre-admission baseline Dressing (OT): Needs assistance Is this a change from baseline?: Pre-admission baseline Grooming: Needs assistance Is this a change from baseline?: Pre-admission baseline Feeding: Independent Bathing: Needs assistance Is this a change from baseline?: Pre-admission baseline Toileting: Needs assistance Is this a change from baseline?: Pre-admission baseline In/Out Bed: Needs assistance Is this a change from baseline?: Pre-admission baseline Walks in Home: Needs assistance Is this a change from baseline?: Pre-admission baseline Does the patient have difficulty walking or climbing stairs?: No Weakness of Legs: Both Weakness of Arms/Hands: Both  Permission Sought/Granted Permission sought to share information with : Chartered certified accountant granted to share information with : Yes, Verbal Permission Granted     Permission granted to share info w AGENCY: snfs        Emotional Assessment   Attitude/Demeanor/Rapport: Engaged Affect (typically observed): Pleasant Orientation: : Oriented to Self, Oriented to Place, Oriented to  Time, Oriented to Situation Alcohol / Substance Use: Not Applicable Psych Involvement: No (comment)  Admission diagnosis:  Weakness [R53.1] COVID toes [U07.1, R23.8] Pneumonia due to COVID-19 virus [U07.1, J12.82] Patient Active Problem List   Diagnosis Date Noted  . Weakness 03/14/2020  .  COVID toes 03/14/2020  . AKI (acute kidney injury) ([place])   . Pneumonia due to COVID-19 virus 03/04/2020  . Acute metabolic encephalopathy 89/78/4784  . COVID-19 virus infection 03/03/2020  . History of colonic polyps 08/21/2019  . Peripheral vascular disease ([place]) 08/01/2019  . Type II diabetes mellitus, uncontrolled ([place]) 04/23/2019  . Essential hypertension, benign 04/23/2019  . C. difficile colitis 11/09/2017  . Focal neurological deficit 07/21/2013  . GBS (Guillain Barre syndrome) ([place]) 07/21/2013  . Leg weakness 07/21/2013  . Femur fracture ([place]) 06/25/2013  . Femur fracture, right ([place]) 06/25/2013  . GOUT 07/31/2009  . AF (atrial fibrillation) ([place]) 07/31/2009  . Atrial flutter ([place]) 07/31/2009   PCP:  Doree Albee, MD Pharmacy:   [facility] [place] [address] [place] [address] Phone: [phone] Fax: [phone]     Social Determinants of Health (SDOH) Interventions    Readmission Risk Interventions Readmission Risk Prevention Plan 03/16/2020  Transportation Screening Complete  Home Care Screening Not Complete  Home Care Screening Not Completed Comments pt wants SNF  Medication Review (RN CM) Complete  Some recent data might be hidden

## 2020-03-16 NOTE — Progress Notes (Signed)
SATURATION QUALIFICATIONS: (This note is used to comply with regulatory documentation for home oxygen)  Patient Saturations on Room Air at Rest = 90 %  Patient Saturations on Room Air while Ambulating = 85 %  Patient Saturations on 4  Liters of oxygen while Ambulating = 95 %  Please briefly explain why patient needs home oxygen: Patient unable to maintain O2 saturation without oxygen.

## 2020-03-16 NOTE — Progress Notes (Signed)
PROGRESS NOTE   Marcus [name]  FWY:[id number] DOB: April 14, 1946 DOA: 03/13/2020 PCP: Doree Albee, MD   Chief Complaint  Patient presents with  . COVID Positive   Brief Admission History:  74 y.o. male, 3 of type 2 diabetes mellitus, Guillain-Barr syndrome, gout, GERD, essential hypertension, blue toes, A. fib/a flutter, presents to the ER with a chief complaint of weakness and poor p.o. intake.  Patient reports that he was recently discharged after hospitalization with Covid.  He has been weaker and weaker at home.  In the middle of the night he got up to go to the bathroom, and fell down.  Patient reports that he did not trip he did not have chest pain he did not have palpitations he did not have shortness of breath, but he just went down.  Patient reports that he remained on the floor until his wife woke up and found him, and called EMS.  Assessment & Plan:   Principal Problem:   Pneumonia due to COVID-19 virus Active Problems:   GOUT   AF (atrial fibrillation) (HCC)   Leg weakness   Type II diabetes mellitus, uncontrolled (HCC)   Essential hypertension, benign   Peripheral vascular disease ([place])   COVID-19 virus infection   Weakness   COVID toes  1. Pneumonia due to Covid 19 - Pt was fully treated during recent hospitalization. He completed course of remdesivir and dexamethasone.  He was readmitted with vague symptoms but mostly with weakness and fatigue.  Fell down at home.  He requires 1-4L/min [place] supplemental oxygen. Wean oxygen as able.   2. Acute respiratory failure with hypoxia - continue supplemental oxygen and follow.  3. Protein calorie malnutrition Moderate - consult to dietitian.  4. AKI - Treated with IV fluids and resolved.  5. Covid Toes - He has good pedal pulses. Follow.  6. Type 2 Diabetes mellitus - he is having steroid induced hyperglycemia, we have titrated his insulin doses and therapy and following CBGs closely.  7. Chronic atrial fibrillation  - he is rate controlled with metoprolol but not anticoagulated presumably due to frequent falling and high fall risk.  8. Generalized weakness - TOC working on SNF placement at a covid facility for SNF rehab.  Pt and wife agreeable.    DVT prophylaxis:  SCDs, SQ heparin  Code Status:  Full  Family Communication: called wife [name] by telephone 8/21, 8/22 Disposition:   Status is: Inpatient  Remains inpatient appropriate because:IV treatments appropriate due to intensity of illness or inability to take PO and Inpatient level of care appropriate due to severity of illness  Dispo: The patient is from: Home              Anticipated d/c is to: SNF               Anticipated d/c date is: 1 day              Patient currently is not medically stable to d/c.  Consultants:     Procedures:     Antimicrobials:   ceftriaxone/azithromycin 8/19>>  Subjective: Pt feeling weak and unsteady on his feet but his shortness of breath is improving.      Objective: Vitals:   03/15/20 2015 03/16/20 0623 03/16/20 0747 03/16/20 1432  BP: 131/70 117/79  (!) 106/57  Pulse: (!) 102 93  82  Resp: 20 20  20   Temp: 97.8 F (36.6 C) 97.8 F (36.6 C)  97.8 F (36.6 C)  TempSrc:    Oral  SpO2: 96% 94% 92% 96%  Weight:      Height:        Intake/Output Summary (Last 24 hours) at 03/16/2020 1508 Last data filed at 03/16/2020 1300 Gross per 24 hour  Intake 840 ml  Output 200 ml  Net 640 ml   Filed Weights   03/14/20 0311  Weight: 86.2 kg   Examination:  General exam: Appears calm and comfortable, appears chronically ill.  Respiratory system: no increased WOB.  Cardiovascular system: normal S1 & S2 heard. No JVD, murmurs, rubs, gallops or clicks. No pedal edema. Gastrointestinal system: Abdomen is nondistended, soft and nontender. No organomegaly or masses felt. Normal bowel sounds heard. Central nervous system: Alert and oriented. No focal neurological deficits. Extremities: Symmetric 5 x 5  power. Covid toes.  Cyanotic toes.  Good pedal pulses in feet and warm.  Skin: No rashes, lesions or ulcers Psychiatry: Judgement and insight appear normal. Mood & affect appropriate.   Data Reviewed: I have personally reviewed following labs and imaging studies  CBC: Recent Labs  Lab 03/13/20 1803 03/14/20 0840 03/15/20 0613 03/16/20 0721  WBC 8.9 8.4 8.1 7.6  NEUTROABS 7.9* 7.9* 7.3 6.8  HGB 16.7 15.9 14.0 13.7  HCT 51.0 49.5 42.4 41.8  MCV 92.6 94.1 92.6 93.1  PLT 195 181 158 143*    Basic Metabolic Panel: Recent Labs  Lab 03/13/20 1803 03/14/20 0840 03/15/20 0613 03/16/20 0721  NA 133* 134* 133* 135  K 4.3 4.6 4.1 4.4  CL 103 103 105 106  CO2 19* 19* 20* 20*  GLUCOSE 231* 186* 170* 224*  BUN 47* 37* 37* 36*  CREATININE 1.57* 1.31* 1.20 1.10  CALCIUM 7.9* 7.8* 7.5* 7.8*  MG  --  2.0 1.8 1.8    GFR: Estimated Creatinine Clearance: 66.4 mL/min (by C-G formula based on SCr of 1.1 mg/dL).  Liver Function Tests: Recent Labs  Lab 03/13/20 1803 03/14/20 0840 03/15/20 0613 03/16/20 0721  AST 38 62* 82* 143*  ALT 36 47* 63* 133*  ALKPHOS 64 65 61 77  BILITOT 1.4* 1.1 0.5 0.5  PROT 5.9* 5.8* 5.0* 5.0*  ALBUMIN 2.5* 2.4* 2.0* 2.0*    CBG: Recent Labs  Lab 03/15/20 1122 03/15/20 1621 03/15/20 2054 03/16/20 0737 03/16/20 1119  GLUCAP 287* 93 207* 225* 340*    Recent Results (from the past 240 hour(s))  Blood Culture (routine x 2)     Status: None (Preliminary result)   Collection Time: 03/13/20  5:05 PM   Specimen: Left Antecubital; Blood  Result Value Ref Range Status   Specimen Description LEFT ANTECUBITAL  Final   Special Requests   Final    BOTTLES DRAWN AEROBIC AND ANAEROBIC Blood Culture adequate volume   Culture   Final    NO GROWTH 3 DAYS Performed at [facility], [address] [place], [place] [address]    Report Status PENDING  Incomplete  Blood Culture (routine x 2)     Status: None (Preliminary result)   Collection Time: 03/13/20   5:05 PM   Specimen: BLOOD  Result Value Ref Range Status   Specimen Description BLOOD  Final   Special Requests NONE  Final   Culture   Final    NO GROWTH 3 DAYS Performed at [facility], [address] [place], [place] [address]    Report Status PENDING  Incomplete     Radiology Studies: No results found. Scheduled Meds: . albuterol  2 puff Inhalation  BID  . aspirin EC  81 mg Oral Daily  . dexamethasone (DECADRON) injection  6 mg Intravenous Q24H  . feeding supplement (ENSURE ENLIVE)  237 mL Oral BID BM  . heparin  5,000 Units Subcutaneous Q8H  . insulin aspart  0-20 Units Subcutaneous TID WC  . insulin aspart  0-5 Units Subcutaneous QHS  . insulin aspart  8 Units Subcutaneous TID WC  . [START ON 03/17/2020] insulin glargine  20 Units Subcutaneous Daily  . metoprolol tartrate  75 mg Oral BID  . multivitamin with minerals  1 tablet Oral Daily  . pravastatin  20 mg Oral Daily   Continuous Infusions: . sodium chloride 1,000 mL (03/16/20 0909)  . azithromycin 500 mg (03/15/20 2221)  . cefTRIAXone (ROCEPHIN)  IV 1 g (03/15/20 2100)    LOS: 3 days   Time spent: 30 minutes   Jimia Gentles Wynetta Emery, MD How to contact the [facility] Attending or Consulting provider [place] or covering provider during after hours [place], for this patient?  1. Check the care team in [facility] and look for a) attending/consulting TRH provider listed and b) the [facility] team listed 2. Log into [email or web address] and use [place]'s universal password to access. If you do not have the password, please contact the hospital operator. 3. Locate the [facility] provider you are looking for under [facility] and page to a number that you can be directly reached. 4. If you still have difficulty reaching the provider, please page the [facility] (Director on Call) for the Hospitalists listed on amion for assistance.  03/16/2020, 3:08 PM

## 2020-03-17 ENCOUNTER — Telehealth (INDEPENDENT_AMBULATORY_CARE_PROVIDER_SITE_OTHER): Payer: Medicare Other | Admitting: Internal Medicine

## 2020-03-17 DIAGNOSIS — J9601 Acute respiratory failure with hypoxia: Secondary | ICD-10-CM | POA: Diagnosis not present

## 2020-03-17 DIAGNOSIS — E119 Type 2 diabetes mellitus without complications: Secondary | ICD-10-CM | POA: Diagnosis not present

## 2020-03-17 DIAGNOSIS — R2689 Other abnormalities of gait and mobility: Secondary | ICD-10-CM | POA: Diagnosis not present

## 2020-03-17 DIAGNOSIS — J1281 Pneumonia due to SARS-associated coronavirus: Secondary | ICD-10-CM | POA: Diagnosis not present

## 2020-03-17 DIAGNOSIS — I739 Peripheral vascular disease, unspecified: Secondary | ICD-10-CM | POA: Diagnosis not present

## 2020-03-17 DIAGNOSIS — Z7401 Bed confinement status: Secondary | ICD-10-CM | POA: Diagnosis not present

## 2020-03-17 DIAGNOSIS — R41841 Cognitive communication deficit: Secondary | ICD-10-CM | POA: Diagnosis not present

## 2020-03-17 DIAGNOSIS — M6281 Muscle weakness (generalized): Secondary | ICD-10-CM | POA: Diagnosis not present

## 2020-03-17 DIAGNOSIS — Z20822 Contact with and (suspected) exposure to covid-19: Secondary | ICD-10-CM | POA: Diagnosis not present

## 2020-03-17 DIAGNOSIS — M1A9XX0 Chronic gout, unspecified, without tophus (tophi): Secondary | ICD-10-CM | POA: Diagnosis not present

## 2020-03-17 DIAGNOSIS — E1169 Type 2 diabetes mellitus with other specified complication: Secondary | ICD-10-CM | POA: Diagnosis not present

## 2020-03-17 DIAGNOSIS — R531 Weakness: Secondary | ICD-10-CM

## 2020-03-17 DIAGNOSIS — R5381 Other malaise: Secondary | ICD-10-CM | POA: Diagnosis not present

## 2020-03-17 DIAGNOSIS — R238 Other skin changes: Secondary | ICD-10-CM | POA: Diagnosis not present

## 2020-03-17 DIAGNOSIS — J1282 Pneumonia due to coronavirus disease 2019: Secondary | ICD-10-CM | POA: Diagnosis not present

## 2020-03-17 DIAGNOSIS — J449 Chronic obstructive pulmonary disease, unspecified: Secondary | ICD-10-CM | POA: Diagnosis not present

## 2020-03-17 DIAGNOSIS — I10 Essential (primary) hypertension: Secondary | ICD-10-CM | POA: Diagnosis not present

## 2020-03-17 DIAGNOSIS — I4891 Unspecified atrial fibrillation: Secondary | ICD-10-CM | POA: Diagnosis not present

## 2020-03-17 DIAGNOSIS — G610 Guillain-Barre syndrome: Secondary | ICD-10-CM | POA: Diagnosis not present

## 2020-03-17 DIAGNOSIS — M6259 Muscle wasting and atrophy, not elsewhere classified, multiple sites: Secondary | ICD-10-CM | POA: Diagnosis not present

## 2020-03-17 DIAGNOSIS — U071 COVID-19: Secondary | ICD-10-CM | POA: Diagnosis not present

## 2020-03-17 DIAGNOSIS — E440 Moderate protein-calorie malnutrition: Secondary | ICD-10-CM | POA: Diagnosis not present

## 2020-03-17 DIAGNOSIS — M109 Gout, unspecified: Secondary | ICD-10-CM | POA: Diagnosis not present

## 2020-03-17 DIAGNOSIS — R2681 Unsteadiness on feet: Secondary | ICD-10-CM | POA: Diagnosis not present

## 2020-03-17 DIAGNOSIS — R29898 Other symptoms and signs involving the musculoskeletal system: Secondary | ICD-10-CM | POA: Diagnosis not present

## 2020-03-17 LAB — COMPREHENSIVE METABOLIC PANEL
ALT: 124 U/L — ABNORMAL HIGH (ref 0–44)
AST: 84 U/L — ABNORMAL HIGH (ref 15–41)
Albumin: 2 g/dL — ABNORMAL LOW (ref 3.5–5.0)
Alkaline Phosphatase: 74 U/L (ref 38–126)
Anion gap: 8 (ref 5–15)
BUN: 33 mg/dL — ABNORMAL HIGH (ref 8–23)
CO2: 21 mmol/L — ABNORMAL LOW (ref 22–32)
Calcium: 7.8 mg/dL — ABNORMAL LOW (ref 8.9–10.3)
Chloride: 109 mmol/L (ref 98–111)
Creatinine, Ser: 1.18 mg/dL (ref 0.61–1.24)
GFR calc Af Amer: >60 mL/min (ref >60)
GFR calc non Af Amer: 60 mL/min — ABNORMAL LOW (ref >60)
Glucose, Bld: 138 mg/dL — ABNORMAL HIGH (ref 70–99)
Potassium: 4.6 mmol/L (ref 3.5–5.1)
Sodium: 138 mmol/L (ref 135–145)
Total Bilirubin: 0.5 mg/dL (ref 0.3–1.2)
Total Protein: 5 g/dL — ABNORMAL LOW (ref 6.5–8.1)

## 2020-03-17 LAB — C-REACTIVE PROTEIN: CRP: 4.7 mg/dL — ABNORMAL HIGH (ref <1.0)

## 2020-03-17 LAB — CBC WITH DIFFERENTIAL/PLATELET
Abs Immature Granulocytes: 0.03 10*3/uL (ref 0.00–0.07)
Basophils Absolute: 0 10*3/uL (ref 0.0–0.1)
Basophils Relative: 0 %
Eosinophils Absolute: 0 10*3/uL (ref 0.0–0.5)
Eosinophils Relative: 0 %
HCT: 40.9 % (ref 39.0–52.0)
Hemoglobin: 13.3 g/dL (ref 13.0–17.0)
Lymphocytes Relative: 8 %
Lymphs Abs: 0.6 10*3/uL — ABNORMAL LOW (ref 0.7–4.0)
MCH: 30.8 pg (ref 26.0–34.0)
MCHC: 32.5 g/dL (ref 30.0–36.0)
MCV: 94.7 fL (ref 80.0–100.0)
Monocytes Absolute: 0.1 10*3/uL (ref 0.1–1.0)
Monocytes Relative: 1 %
Neutro Abs: 6.4 10*3/uL (ref 1.7–7.7)
Neutrophils Relative %: 91 %
Platelets: 142 10*3/uL — ABNORMAL LOW (ref 150–400)
RBC: 4.32 MIL/uL (ref 4.22–5.81)
RDW: 13.9 % (ref 11.5–15.5)
WBC: 7 10*3/uL (ref 4.0–10.5)
nRBC: 0 % (ref 0.0–0.2)

## 2020-03-17 LAB — D-DIMER, QUANTITATIVE: D-Dimer, Quant: 2.57 ug/mL-FEU — ABNORMAL HIGH (ref 0.00–0.50)

## 2020-03-17 LAB — FERRITIN: Ferritin: 2473 ng/mL — ABNORMAL HIGH (ref 24–336)

## 2020-03-17 LAB — GLUCOSE, CAPILLARY
Glucose-Capillary: 149 mg/dL — ABNORMAL HIGH (ref 70–99)
Glucose-Capillary: 335 mg/dL — ABNORMAL HIGH (ref 70–99)

## 2020-03-17 LAB — MAGNESIUM: Magnesium: 2 mg/dL (ref 1.7–2.4)

## 2020-03-17 MED ORDER — GUAIFENESIN-DM 100-10 MG/5ML PO SYRP: 10.0000 mL | ORAL_SOLUTION | ORAL | 0 refills | Status: DC | PRN

## 2020-03-17 MED ORDER — POLYETHYLENE GLYCOL 3350 17 G PO PACK: 17.0000 g | PACK | Freq: Every day | ORAL | 0 refills | Status: DC | PRN

## 2020-03-17 MED ORDER — ALBUTEROL SULFATE HFA 108 (90 BASE) MCG/ACT IN AERS: 2.0000 | INHALATION_SPRAY | Freq: Four times a day (QID) | RESPIRATORY_TRACT | Status: DC | PRN

## 2020-03-17 MED ORDER — DEXAMETHASONE 2 MG PO TABS: ORAL_TABLET | ORAL | Status: DC

## 2020-03-17 MED ORDER — ACETAMINOPHEN 325 MG PO TABS: 650.0000 mg | ORAL_TABLET | Freq: Four times a day (QID) | ORAL | Status: DC | PRN

## 2020-03-17 MED ORDER — PANTOPRAZOLE SODIUM 40 MG PO TBEC: 40.0000 mg | DELAYED_RELEASE_TABLET | Freq: Every day | ORAL | 0 refills | Status: DC

## 2020-03-17 MED ORDER — DOXYCYCLINE HYCLATE 100 MG PO CAPS: 100.0000 mg | ORAL_CAPSULE | Freq: Two times a day (BID) | ORAL | 0 refills | Status: AC

## 2020-03-17 MED ORDER — ADULT MULTIVITAMIN W/MINERALS CH: 1.0000 | ORAL_TABLET | Freq: Every day | ORAL | Status: DC

## 2020-03-17 NOTE — Discharge Summary (Signed)
Physician Discharge Summary  Marcus [name] YPP:[id number] DOB: [birth year] DOA: 03/13/2020  PCP: Doree Albee, MD  Admit date: 03/13/2020 Discharge date: 03/17/2020  Admitted From:  Home  Disposition:  [name]   Recommendations for Outpatient Follow-up:  1. Follow up with PCP in 2-3 weeks  2. Follow covid toes with exams and pedal pulse checks 3. Please monitor CBG and treat steroid induced hyperglycemia as needed.  4. Please see dexamethasone taper instructions.  5. Please wean oxygen as able.   Discharge Condition: STABLE   CODE STATUS: FULL    Brief Hospitalization Summary: Please see all hospital notes, images, labs for full details of the hospitalization. ADMISSION HPI:  Marcus [name]  is a 74 y.o. male, 3 of type 2 diabetes mellitus, Guillain-Barr syndrome, gout, GERD, essential hypertension, blue toes, A. fib/a flutter, presents to the ER with a chief complaint of weakness and poor p.o. intake.  Patient reports that he was recently discharged after hospitalization with Covid.  He has been weaker and weaker at home.  In the middle of the night he got up to go to the bathroom, and fell down.  Patient reports that he did not trip he did not have chest pain he did not have palpitations he did not have shortness of breath, but he just went down.  Patient reports that he remained on the floor until his wife woke up and found him, and called EMS.  Patient thinks that he did hit his head during his fall but he did not have loss of consciousness.  He has had no headache, no change in vision or hearing.  Patient does have an abrasion on his forehead.  Patient reports that he has not been eating at home, and he cannot remember when his last normal meal was.  He denies constipation, diarrhea, abdominal pain.  He admits to some nausea, but no vomiting.  Patient reports that he has not felt short of breath.  He does not have oxygen at home.  The symptoms have been ongoing since  March 05, 2020-his last discharge.  Patient remains somewhat confused, although he is alert and oriented x3.  Patient reports he is not sure if he has had a cough.  He also reports that he is only here because his wife made him come.  Although he cannot get up, or walk, he does not think he needs to be in the hospital.  This shows little insight into his own healthcare.  In the ED Temperature 101.7, heart rate 108, respiratory rate 20, blood pressure 137/87, 88% on room air improving to 96% on 2 L nasal cannula No leukocytosis with a white blood cell count of 8.9 Basic CHEM shows a BUN of 47, creatinine 1.57, glucose 231, otherwise unremarkable LDH is 186, triglycerides 135, lactic acid 1.4, pro-Cal 1.46, D-dimer 3.59, fibrinogen 716, INR 1.3, blood cultures pending.   Chest x-ray shows mild multifocal infiltrates right greater than left CTA shows no pulmonary embolus.    HOSPITAL COURSE:   Brief Admission History:  74 y.o.male,3 of type 2 diabetes mellitus, Guillain-Barr syndrome, gout, GERD, essential hypertension, blue toes, A. fib/a flutter, presents to the ER with a chief complaint of weakness and poor p.o. intake. Patient reports that he was recently discharged after hospitalization with Covid. He has been weaker and weaker at home. In the middle of the night he got up to go to the bathroom, and fell down. Patient reports that he did not trip he  did not have chest pain he did not have palpitations he did not have shortness of breath, but he just went down. Patient reports that he remained on the floor until his wife woke up and found him, and called EMS.  Assessment & Plan:   Principal Problem:   Pneumonia due to COVID-19 virus Active Problems:   GOUT   AF (atrial fibrillation) (HCC)   Leg weakness   Type II diabetes mellitus, uncontrolled (HCC)   Essential hypertension, benign   Peripheral vascular disease ([place])   COVID-19 virus infection   Weakness   COVID  toes  1. Pneumonia due to Covid 19 - Pt was fully treated during recent hospitalization. He completed course of remdesivir and dexamethasone.  He was readmitted with vague symptoms but mostly with weakness and fatigue.  Fell down at home.  He requires 1-4L/min [place] supplemental oxygen. Wean oxygen as able.  He remains stable on 4L at this time.  He will complete a taper of dexamethasone at discharge.  He will discharge to a SNF accepting COVID patients.   2. Acute respiratory failure with hypoxia - continue supplemental oxygen and wean oxygen as able.  He has remained stable on 4L [place].   3. Protein calorie malnutrition Moderate - consult to dietitian.  4. AKI - Treated with IV fluids and resolved.  5. Covid Toes - He has good pedal pulses. Recommend serial monitoring of toes and pedal pulse checks.    6. Type 2 Diabetes mellitus - he had some steroid induced hyperglycemia. Monitor CBGs. Resumed home meds.    7. Chronic atrial fibrillation - he is rate controlled with metoprolol but not anticoagulated presumably due to frequent falling and high fall risk.  8. Generalized weakness - TOC working on SNF placement at a covid facility for SNF rehab.  Pt and wife agreeable.    DVT prophylaxis:  SCDs, SQ heparin  Code Status:  Full  Family Communication: called wife [name] by telephone 8/21, 8/22 Disposition:  Discharge Diagnoses:  Principal Problem:   Pneumonia due to COVID-19 virus Active Problems:   GOUT   AF (atrial fibrillation) (HCC)   Leg weakness   Type II diabetes mellitus, uncontrolled ([place])   Essential hypertension, benign   Peripheral vascular disease ([place])   COVID-19 virus infection   Weakness   COVID toes   Discharge Instructions:  Allergies as of 03/17/2020      Reactions   Influenza Vaccines Other (See Comments)   Guillain-Barre syndrome      Medication List    TAKE these medications   acetaminophen 325 MG tablet Commonly known as: TYLENOL Take 2 tablets (650 mg  total) by mouth every 6 (six) hours as needed for mild pain or headache (fever >/= 101).   albuterol 108 (90 Base) MCG/ACT inhaler Commonly known as: VENTOLIN HFA Inhale 2 puffs into the lungs every 6 (six) hours as needed for wheezing or shortness of breath.   aspirin EC 81 MG tablet Take 1 tablet (81 mg total) by mouth daily.   dexamethasone 2 MG tablet Commonly known as: DECADRON Take 3 tab daily x 7 days, then 2 tab daily x 5 days, then 1 tab daily x 5 days Start taking on: March 18, 2020 What changed:   medication strength  how much to take  how to take this  when to take this  additional instructions   doxycycline 100 MG capsule Commonly known as: VIBRAMYCIN Take 1 capsule (100 mg total) by mouth 2 (two)  times daily for 2 days.   glipiZIDE 5 MG tablet Commonly known as: GLUCOTROL TAKE 1 TABLET DAILY.   glucose blood test strip Use to check your blood glucose once a day.   guaiFENesin-dextromethorphan 100-10 MG/5ML syrup Commonly known as: ROBITUSSIN DM Take 10 mLs by mouth every 4 (four) hours as needed for cough.   metFORMIN 500 MG 24 hr tablet Commonly known as: GLUCOPHAGE-XR TAKE (1) TABLET TWICE DAILY. What changed: See the new instructions.   Metoprolol Tartrate 75 MG Tabs Take 75 mg by mouth 2 (two) times daily.   multivitamin with minerals Tabs tablet Take 1 tablet by mouth daily. Start taking on: March 18, 2020   pantoprazole 40 MG tablet Commonly known as: Protonix Take 1 tablet (40 mg total) by mouth daily.   polyethylene glycol 17 g packet Commonly known as: MIRALAX / GLYCOLAX Take 17 g by mouth daily as needed for mild constipation.   pravastatin 20 MG tablet Commonly known as: PRAVACHOL Take 1 tablet (20 mg total) by mouth daily.   vitamin B-12 500 MCG tablet Commonly known as: CYANOCOBALAMIN Take 1 tablet (500 mcg total) by mouth daily.       Contact information for after-discharge care    Destination    [facility]  Preferred SNF .   Service: Skilled Nursing Contact information: [address] [place] [phone]                 Allergies  Allergen Reactions  . Influenza Vaccines Other (See Comments)    Guillain-Barre syndrome   Allergies as of 03/17/2020      Reactions   Influenza Vaccines Other (See Comments)   Guillain-Barre syndrome      Medication List    TAKE these medications   acetaminophen 325 MG tablet Commonly known as: TYLENOL Take 2 tablets (650 mg total) by mouth every 6 (six) hours as needed for mild pain or headache (fever >/= 101).   albuterol 108 (90 Base) MCG/ACT inhaler Commonly known as: VENTOLIN HFA Inhale 2 puffs into the lungs every 6 (six) hours as needed for wheezing or shortness of breath.   aspirin EC 81 MG tablet Take 1 tablet (81 mg total) by mouth daily.   dexamethasone 2 MG tablet Commonly known as: DECADRON Take 3 tab daily x 7 days, then 2 tab daily x 5 days, then 1 tab daily x 5 days Start taking on: March 18, 2020 What changed:   medication strength  how much to take  how to take this  when to take this  additional instructions   doxycycline 100 MG capsule Commonly known as: VIBRAMYCIN Take 1 capsule (100 mg total) by mouth 2 (two) times daily for 2 days.   glipiZIDE 5 MG tablet Commonly known as: GLUCOTROL TAKE 1 TABLET DAILY.   glucose blood test strip Use to check your blood glucose once a day.   guaiFENesin-dextromethorphan 100-10 MG/5ML syrup Commonly known as: ROBITUSSIN DM Take 10 mLs by mouth every 4 (four) hours as needed for cough.   metFORMIN 500 MG 24 hr tablet Commonly known as: GLUCOPHAGE-XR TAKE (1) TABLET TWICE DAILY. What changed: See the new instructions.   Metoprolol Tartrate 75 MG Tabs Take 75 mg by mouth 2 (two) times daily.   multivitamin with minerals Tabs tablet Take 1 tablet by mouth daily. Start taking on: March 18, 2020   pantoprazole 40 MG  tablet Commonly known as: Protonix Take 1 tablet (40 mg total) by  mouth daily.   polyethylene glycol 17 g packet Commonly known as: MIRALAX / GLYCOLAX Take 17 g by mouth daily as needed for mild constipation.   pravastatin 20 MG tablet Commonly known as: PRAVACHOL Take 1 tablet (20 mg total) by mouth daily.   vitamin B-12 500 MCG tablet Commonly known as: CYANOCOBALAMIN Take 1 tablet (500 mcg total) by mouth daily.      Procedures/Studies: CT Head Wo Contrast  Result Date: 03/03/2020 CLINICAL DATA:  74 year old male with altered mental status, found driving abnormally. EXAM: CT HEAD WITHOUT CONTRAST TECHNIQUE: Contiguous axial images were obtained from the base of the skull through the vertex without intravenous contrast. COMPARISON:  Head CT 07/21/2013. FINDINGS: Brain: Cerebral volume is not significantly changed since 2014 and seems within normal limits for age. No midline shift, ventriculomegaly, mass effect, evidence of mass lesion, intracranial hemorrhage or evidence of cortically based acute infarction. Gray-white matter differentiation is within normal limits throughout the brain. No encephalomalacia identified. Vascular: Mild Calcified atherosclerosis at the skull base. No suspicious intracranial vascular hyperdensity. Skull: No acute osseous abnormality identified. Sinuses/Orbits: Visualized paranasal sinuses and mastoids are stable and well pneumatized. Other: No acute orbit or scalp soft tissue finding. IMPRESSION: No acute intracranial abnormality. Normal for age non contrast CT appearance of the brain. Electronically Signed   By: Genevie Ann M.D.   On: 03/03/2020 20:56   CT ANGIO CHEST PE W OR WO CONTRAST  Result Date: 03/13/2020 CLINICAL DATA:  PE suspected, low/intermediate prob, positive D-dimer COVID positive 03/03/2020 EXAM: CT ANGIOGRAPHY CHEST WITH CONTRAST TECHNIQUE: Multidetector CT imaging of the chest was performed using the standard protocol during bolus administration  of intravenous contrast. Multiplanar CT image reconstructions and MIPs were obtained to evaluate the vascular anatomy. CONTRAST:  54m OMNIPAQUE IOHEXOL 350 MG/ML SOLN COMPARISON:  Radiograph earlier today. FINDINGS: Cardiovascular: There are no filling defects within the pulmonary arteries to suggest pulmonary embolus. Thoracic aorta is normal in caliber. Mild aortic atherosclerosis. Mild multi chamber cardiomegaly. No pericardial effusion. Scattered coronary artery calcifications. Mediastinum/Nodes: Shotty mediastinal and hilar lymph nodes are all subcentimeter short axis, likely reactive. Tiny hiatal hernia. Patulous upper esophagus. No thyroid nodule. Lungs/Pleura: Patchy multifocal ill-defined and nodular ground-glass opacities within both lungs, pattern typical of COVID-19 pneumonia. Additional dependent atelectasis in the lower lobes. There are bilateral calcified pleural plaques. No pleural fluid. Trachea and central bronchi are patent. Upper Abdomen: No acute or unexpected finding.  Tiny hiatal hernia. Musculoskeletal: There are no acute or suspicious osseous abnormalities. Multilevel degenerative change in the spine. Review of the MIP images confirms the above findings. IMPRESSION: 1. No pulmonary embolus. 2. Patchy multifocal ill-defined and nodular ground-glass opacities within both lungs, pattern typical of COVID-19 pneumonia. Parenchymal involvement is mild. 3. A few scattered calcified pleural plaques consistent with prior asbestos exposure. 4. Mild cardiomegaly. Coronary artery calcifications. Aortic Atherosclerosis (ICD10-I70.0). Electronically Signed   By: MKeith RakeM.D.   On: 03/13/2020 20:54   DG Chest Port 1 View  Result Date: 03/13/2020 CLINICAL DATA:  COVID positive with weakness. EXAM: PORTABLE CHEST 1 VIEW COMPARISON:  March 03, 2020 FINDINGS: Small ill-defined multifocal infiltrates are seen along the periphery of both lungs, right greater than left. There is no evidence of a  pleural effusion or pneumothorax. The cardiac silhouette is borderline in size. There is mild calcification of the aortic arch. The visualized skeletal structures are unremarkable. IMPRESSION: Mild multifocal infiltrates, right greater than left. Electronically Signed   By: TJoyce GrossD.  On: 03/13/2020 17:36   DG Chest Port 1 View  Result Date: 03/03/2020 CLINICAL DATA:  74 year old male with sepsis. EXAM: PORTABLE CHEST 1 VIEW COMPARISON:  None. FINDINGS: Minimal left lung base atelectasis. Mild cardiomegaly. No congestion or edema. No focal consolidation, pleural effusion, or pneumothorax. Atherosclerotic calcification of the aortic arch. No acute osseous pathology. IMPRESSION: No focal consolidation. Electronically Signed   By: Anner Crete M.D.   On: 03/03/2020 18:47      Subjective: Pt without complaints.  He denies shortness of breath and denies chest pain. His cough is improving.  He is agreeable to going to SNF.    Discharge Exam: Vitals:   03/17/20 0554 03/17/20 0848  BP: (!) 136/96   Pulse: 78   Resp: 16   Temp: 97.7 F (36.5 C)   SpO2: 92% 95%   Vitals:   03/16/20 2144 03/17/20 0225 03/17/20 0554 03/17/20 0848  BP: 122/80 123/85 (!) 136/96   Pulse: (!) 103 64 78   Resp: 16 16 16    Temp: 97.8 F (36.6 C) (!) 97.5 F (36.4 C) 97.7 F (36.5 C)   TempSrc: Oral Oral Oral   SpO2: 91% 95% 92% 95%  Weight:      Height:       General exam: Appears calm and comfortable, appears chronically ill.  Respiratory system: no increased WOB.  Cardiovascular system: normal S1 & S2 heard. No JVD, murmurs, rubs, gallops or clicks. No pedal edema. Gastrointestinal system: Abdomen is nondistended, soft and nontender. No organomegaly or masses felt. Normal bowel sounds heard. Central nervous system: Alert and oriented. No focal neurological deficits. Extremities: Symmetric 5 x 5 power. Covid toes.  Cyanotic toes.  Good pedal pulses in feet and warm.  Skin: No rashes, lesions  or ulcers Psychiatry: Judgement and insight appear normal. Mood & affect appropriate.    The results of significant diagnostics from this hospitalization (including imaging, microbiology, ancillary and laboratory) are listed below for reference.     Microbiology: Recent Results (from the past 240 hour(s))  Blood Culture (routine x 2)     Status: None (Preliminary result)   Collection Time: 03/13/20  5:05 PM   Specimen: Left Antecubital; Blood  Result Value Ref Range Status   Specimen Description LEFT ANTECUBITAL  Final   Special Requests   Final    BOTTLES DRAWN AEROBIC AND ANAEROBIC Blood Culture adequate volume   Culture   Final    NO GROWTH 3 DAYS Performed at [facility], [address] [place], [place] [address]    Report Status PENDING  Incomplete  Blood Culture (routine x 2)     Status: None (Preliminary result)   Collection Time: 03/13/20  5:05 PM   Specimen: BLOOD  Result Value Ref Range Status   Specimen Description BLOOD  Final   Special Requests NONE  Final   Culture   Final    NO GROWTH 3 DAYS Performed at [facility], [address], [place], [place] [address]    Report Status PENDING  Incomplete     Labs: BNP (last 3 results) No results for input(s): BNP in the last 8760 hours. Basic Metabolic Panel: Recent Labs  Lab 03/13/20 1803 03/14/20 0840 03/15/20 0613 03/16/20 0721 03/17/20 0558  NA 133* 134* 133* 135 138  K 4.3 4.6 4.1 4.4 4.6  CL 103 103 105 106 109  CO2 19* 19* 20* 20* 21*  GLUCOSE 231* 186* 170* 224* 138*  BUN 47* 37* 37* 36* 33*  CREATININE  1.57* 1.31* 1.20 1.10 1.18  CALCIUM 7.9* 7.8* 7.5* 7.8* 7.8*  MG  --  2.0 1.8 1.8 2.0   Liver Function Tests: Recent Labs  Lab 03/13/20 1803 03/14/20 0840 03/15/20 0613 03/16/20 0721 03/17/20 0558  AST 38 62* 82* 143* 84*  ALT 36 47* 63* 133* 124*  ALKPHOS 64 65 61 77 74  BILITOT 1.4* 1.1 0.5 0.5 0.5  PROT 5.9* 5.8* 5.0* 5.0* 5.0*  ALBUMIN 2.5* 2.4* 2.0* 2.0* 2.0*   No results for  input(s): LIPASE, AMYLASE in the last 168 hours. No results for input(s): AMMONIA in the last 168 hours. CBC: Recent Labs  Lab 03/13/20 1803 03/14/20 0840 03/15/20 0613 03/16/20 0721 03/17/20 0558  WBC 8.9 8.4 8.1 7.6 7.0  NEUTROABS 7.9* 7.9* 7.3 6.8 6.4  HGB 16.7 15.9 14.0 13.7 13.3  HCT 51.0 49.5 42.4 41.8 40.9  MCV 92.6 94.1 92.6 93.1 94.7  PLT 195 181 158 143* 142*   Cardiac Enzymes: No results for input(s): CKTOTAL, CKMB, CKMBINDEX, TROPONINI in the last 168 hours. BNP: Invalid input(s): POCBNP CBG: Recent Labs  Lab 03/16/20 1119 03/16/20 1619 03/16/20 2101 03/16/20 2153 03/17/20 0754  GLUCAP 340* 111* 85 112* 149*   D-Dimer Recent Labs    03/16/20 0721 03/17/20 0558  DDIMER 2.64* 2.57*   Hgb A1c No results for input(s): HGBA1C in the last 72 hours. Lipid Profile No results for input(s): CHOL, HDL, LDLCALC, TRIG, CHOLHDL, LDLDIRECT in the last 72 hours. Thyroid function studies No results for input(s): TSH, T4TOTAL, T3FREE, THYROIDAB in the last 72 hours.  Invalid input(s): FREET3 Anemia work up Recent Labs    03/16/20 0721 03/17/20 0558  FERRITIN 3,667* 2,473*   Urinalysis    Component Value Date/Time   COLORURINE YELLOW 03/13/2020 2007   APPEARANCEUR HAZY (A) 03/13/2020 2007   LABSPEC 1.021 03/13/2020 2007   PHURINE 5.0 03/13/2020 2007   GLUCOSEU 50 (A) 03/13/2020 2007   HGBUR SMALL (A) 03/13/2020 2007   BILIRUBINUR NEGATIVE 03/13/2020 2007   KETONESUR 5 (A) 03/13/2020 2007   PROTEINUR 30 (A) 03/13/2020 2007   UROBILINOGEN 1.0 07/21/2013 2130   NITRITE NEGATIVE 03/13/2020 2007   LEUKOCYTESUR NEGATIVE 03/13/2020 2007   Sepsis Labs Invalid input(s): PROCALCITONIN,  WBC,  LACTICIDVEN Microbiology Recent Results (from the past 240 hour(s))  Blood Culture (routine x 2)     Status: None (Preliminary result)   Collection Time: 03/13/20  5:05 PM   Specimen: Left Antecubital; Blood  Result Value Ref Range Status   Specimen Description LEFT  ANTECUBITAL  Final   Special Requests   Final    BOTTLES DRAWN AEROBIC AND ANAEROBIC Blood Culture adequate volume   Culture   Final    NO GROWTH 3 DAYS Performed at [facility], [address] [place], [place] [address]    Report Status PENDING  Incomplete  Blood Culture (routine x 2)     Status: None (Preliminary result)   Collection Time: 03/13/20  5:05 PM   Specimen: BLOOD  Result Value Ref Range Status   Specimen Description BLOOD  Final   Special Requests NONE  Final   Culture   Final    NO GROWTH 3 DAYS Performed at [facility], [address], [place], [place] [address]    Report Status PENDING  Incomplete   Time coordinating discharge:  35 mins  SIGNED:  Irwin Brakeman, MD  [facility] 03/17/2020, 12:06 PM How to contact the [facility] Attending or Consulting provider [place] or covering  provider during after hours [place], for this patient?  1. Check the care team in [facility] and look for a) attending/consulting [facility] provider listed and b) the [facility] team listed 2. Log into [email or web address] and use [place]'s universal password to access. If you do not have the password, please contact the hospital operator. 3. Locate the [facility] provider you are looking for under [facility] and page to a number that you can be directly reached. 4. If you still have difficulty reaching the provider, please page the [facility] (Director on Call) for the Hospitalists listed on amion for assistance.

## 2020-03-17 NOTE — Progress Notes (Signed)
Report called and given to [facility]. All questions were answered and no further questions at this time. Patient in stable condition and in no acute distress at time of discharge. Patient will be transported via [facility]. Patient's spouse has been updated and all questions answered.

## 2020-03-17 NOTE — TOC Transition Note (Signed)
Transition of Care [facility]) - CM/SW Discharge Note   Patient Details  Name: Marcus [name] MRN: [id number] Date of Birth: 1946/04/08  [facility] of [facility] CM/SW Contact:  Ihor Gully, LCSW Phone Number: 03/17/2020, 2:07 PM   Clinical Narrative:    Juliann Pulse at [facility] advised of discharge summary. Discharge clinicals sent.    Final next level of care: Skilled Nursing Facility Barriers to Discharge: No Barriers Identified   Patient Goals and CMS Choice Patient states their goals for this hospitalization and ongoing recovery are:: get better CMS Medicare.gov Compare Post Acute Care list provided to:: Patient Choice offered to / list presented to : Patient  Discharge Placement              Patient chooses bed at: [facility] Patient to be transferred to facility by: [facility] Name of family member notified: Mrs. Luth Patient and family notified of of transfer: 03/17/20  Discharge Plan and Services In-house Referral: Clinical Social Work   Post Acute Care Choice: [place]                               Social Determinants of Health (SDOH) Interventions     Readmission Risk Interventions Readmission Risk Prevention Plan 03/16/2020  Transportation Screening Complete  Home Care Screening Not Complete  Home Care Screening Not Completed Comments pt wants SNF  Medication Review (RN CM) Complete  Some recent data might be hidden

## 2020-03-18 DIAGNOSIS — E119 Type 2 diabetes mellitus without complications: Secondary | ICD-10-CM | POA: Diagnosis not present

## 2020-03-18 DIAGNOSIS — J1281 Pneumonia due to SARS-associated coronavirus: Secondary | ICD-10-CM | POA: Diagnosis not present

## 2020-03-18 DIAGNOSIS — J9601 Acute respiratory failure with hypoxia: Secondary | ICD-10-CM | POA: Diagnosis not present

## 2020-03-18 DIAGNOSIS — G610 Guillain-Barre syndrome: Secondary | ICD-10-CM | POA: Diagnosis not present

## 2020-03-19 LAB — CULTURE, BLOOD (ROUTINE X 2)
Culture: NO GROWTH
Culture: NO GROWTH
Special Requests: ADEQUATE

## 2020-03-25 ENCOUNTER — Ambulatory Visit (INDEPENDENT_AMBULATORY_CARE_PROVIDER_SITE_OTHER): Payer: Medicare Other | Admitting: Internal Medicine

## 2020-03-25 DIAGNOSIS — J1281 Pneumonia due to SARS-associated coronavirus: Secondary | ICD-10-CM | POA: Diagnosis not present

## 2020-03-25 DIAGNOSIS — I4891 Unspecified atrial fibrillation: Secondary | ICD-10-CM | POA: Diagnosis not present

## 2020-03-25 DIAGNOSIS — R5381 Other malaise: Secondary | ICD-10-CM | POA: Diagnosis not present

## 2020-03-25 DIAGNOSIS — G610 Guillain-Barre syndrome: Secondary | ICD-10-CM | POA: Diagnosis not present

## 2020-04-04 DIAGNOSIS — Z20822 Contact with and (suspected) exposure to covid-19: Secondary | ICD-10-CM | POA: Diagnosis not present

## 2020-04-08 ENCOUNTER — Other Ambulatory Visit (HOSPITAL_COMMUNITY)
Admission: RE | Admit: 2020-04-08 | Discharge: 2020-04-08 | Disposition: A | Discharge status: Home / Self Care | Payer: Medicare Other | Source: Ambulatory Visit | Attending: Internal Medicine | Admitting: Internal Medicine

## 2020-04-08 NOTE — Progress Notes (Signed)
Pt's surgery has been canceled.  According to his discharge summary in August, pt is in rehab. at [facility]. No need for Covid test. Nothing further needed.

## 2020-04-09 ENCOUNTER — Telehealth: Payer: Self-pay | Admitting: Podiatry

## 2020-04-09 NOTE — Telephone Encounter (Signed)
pts wife left message checking status of diabetic shoes.  I returned call and explained we had not received diabetic shoe paperwork from pcp and that is what is holding it up.. I have refaxed it today with a note. She may call the office to check.

## 2020-04-10 ENCOUNTER — Ambulatory Visit (HOSPITAL_COMMUNITY): Admit: 2020-04-10 | Payer: Medicare Other | Admitting: Internal Medicine

## 2020-04-10 ENCOUNTER — Encounter (HOSPITAL_COMMUNITY): Payer: Self-pay

## 2020-04-10 SURGERY — COLONOSCOPY
Anesthesia: Moderate Sedation

## 2020-04-15 ENCOUNTER — Other Ambulatory Visit: Payer: Self-pay | Admitting: *Deleted

## 2020-04-15 NOTE — Patient Outreach (Signed)
[facility] Post- Acute Care Coordinator follow up. Member screened for potential [facility] Care Management needs as a benefit of [place] [employer].  Spoke with [name] who indicates member will transition to home soon. Lives with spouse.    Telephone call to Marcus [name] [phone] to discuss [facility] Management services. Patient identifiers confirmed. Explained [place] Management services. [name] reports he is not sure when he is discharging from SNF but will be agreeable to [place] Management follow up.   Will plan to make referral to [facility] upon SNF discharge.    Marthenia Rolling, MSN-Ed, RN,BSN [place] Acute Care Coordinator [phone] [facility]) [phone]  (Toll free office)

## 2020-04-15 NOTE — Patient Outreach (Signed)
Member screened for potential [facility] Care Management needs as a benefit of [place] [employer].  Verified in Patient [place] that member resides in [facility].   Communication sent to [place] to collaborate about anticipated transition plans and potential [facility] Care Management needs.   Marthenia Rolling, MSN-Ed, RN,BSN [place] Acute Care Coordinator [phone] [facility]) [phone]  (Toll free office)

## 2020-04-17 DIAGNOSIS — R5381 Other malaise: Secondary | ICD-10-CM | POA: Diagnosis not present

## 2020-04-17 DIAGNOSIS — I739 Peripheral vascular disease, unspecified: Secondary | ICD-10-CM | POA: Diagnosis not present

## 2020-04-17 DIAGNOSIS — J1281 Pneumonia due to SARS-associated coronavirus: Secondary | ICD-10-CM | POA: Diagnosis not present

## 2020-04-17 DIAGNOSIS — E119 Type 2 diabetes mellitus without complications: Secondary | ICD-10-CM | POA: Diagnosis not present

## 2020-04-19 ENCOUNTER — Other Ambulatory Visit: Payer: Self-pay | Admitting: Internal Medicine

## 2020-04-20 DIAGNOSIS — G610 Guillain-Barre syndrome: Secondary | ICD-10-CM | POA: Diagnosis not present

## 2020-04-20 DIAGNOSIS — Z7984 Long term (current) use of oral hypoglycemic drugs: Secondary | ICD-10-CM | POA: Diagnosis not present

## 2020-04-20 DIAGNOSIS — I4891 Unspecified atrial fibrillation: Secondary | ICD-10-CM | POA: Diagnosis not present

## 2020-04-20 DIAGNOSIS — U071 COVID-19: Secondary | ICD-10-CM | POA: Diagnosis not present

## 2020-04-20 DIAGNOSIS — Z87891 Personal history of nicotine dependence: Secondary | ICD-10-CM | POA: Diagnosis not present

## 2020-04-20 DIAGNOSIS — R630 Anorexia: Secondary | ICD-10-CM | POA: Diagnosis not present

## 2020-04-20 DIAGNOSIS — Z9181 History of falling: Secondary | ICD-10-CM | POA: Diagnosis not present

## 2020-04-20 DIAGNOSIS — I10 Essential (primary) hypertension: Secondary | ICD-10-CM | POA: Diagnosis not present

## 2020-04-20 DIAGNOSIS — J1282 Pneumonia due to coronavirus disease 2019: Secondary | ICD-10-CM | POA: Diagnosis not present

## 2020-04-20 DIAGNOSIS — M109 Gout, unspecified: Secondary | ICD-10-CM | POA: Diagnosis not present

## 2020-04-20 DIAGNOSIS — M6259 Muscle wasting and atrophy, not elsewhere classified, multiple sites: Secondary | ICD-10-CM | POA: Diagnosis not present

## 2020-04-20 DIAGNOSIS — K219 Gastro-esophageal reflux disease without esophagitis: Secondary | ICD-10-CM | POA: Diagnosis not present

## 2020-04-20 DIAGNOSIS — E1151 Type 2 diabetes mellitus with diabetic peripheral angiopathy without gangrene: Secondary | ICD-10-CM | POA: Diagnosis not present

## 2020-04-20 DIAGNOSIS — I4892 Unspecified atrial flutter: Secondary | ICD-10-CM | POA: Diagnosis not present

## 2020-04-20 DIAGNOSIS — E1165 Type 2 diabetes mellitus with hyperglycemia: Secondary | ICD-10-CM | POA: Diagnosis not present

## 2020-04-21 DIAGNOSIS — E1151 Type 2 diabetes mellitus with diabetic peripheral angiopathy without gangrene: Secondary | ICD-10-CM | POA: Diagnosis not present

## 2020-04-21 DIAGNOSIS — I4891 Unspecified atrial fibrillation: Secondary | ICD-10-CM | POA: Diagnosis not present

## 2020-04-21 DIAGNOSIS — I10 Essential (primary) hypertension: Secondary | ICD-10-CM | POA: Diagnosis not present

## 2020-04-21 DIAGNOSIS — J1282 Pneumonia due to coronavirus disease 2019: Secondary | ICD-10-CM | POA: Diagnosis not present

## 2020-04-21 DIAGNOSIS — E1165 Type 2 diabetes mellitus with hyperglycemia: Secondary | ICD-10-CM | POA: Diagnosis not present

## 2020-04-21 DIAGNOSIS — U071 COVID-19: Secondary | ICD-10-CM | POA: Diagnosis not present

## 2020-04-22 ENCOUNTER — Encounter (INDEPENDENT_AMBULATORY_CARE_PROVIDER_SITE_OTHER): Payer: Self-pay | Admitting: Nurse Practitioner

## 2020-04-22 ENCOUNTER — Other Ambulatory Visit: Payer: Self-pay

## 2020-04-22 ENCOUNTER — Other Ambulatory Visit (INDEPENDENT_AMBULATORY_CARE_PROVIDER_SITE_OTHER): Payer: Self-pay

## 2020-04-22 ENCOUNTER — Ambulatory Visit (INDEPENDENT_AMBULATORY_CARE_PROVIDER_SITE_OTHER): Payer: Medicare Other | Admitting: Nurse Practitioner

## 2020-04-22 VITALS — BP 138/70 | HR 84 | Temp 97.3°F | Resp 12 | Ht 72.0 in | Wt 175.4 lb

## 2020-04-22 DIAGNOSIS — E1151 Type 2 diabetes mellitus with diabetic peripheral angiopathy without gangrene: Secondary | ICD-10-CM | POA: Diagnosis not present

## 2020-04-22 DIAGNOSIS — J1282 Pneumonia due to coronavirus disease 2019: Secondary | ICD-10-CM | POA: Diagnosis not present

## 2020-04-22 DIAGNOSIS — I4891 Unspecified atrial fibrillation: Secondary | ICD-10-CM

## 2020-04-22 DIAGNOSIS — U071 COVID-19: Secondary | ICD-10-CM

## 2020-04-22 DIAGNOSIS — K219 Gastro-esophageal reflux disease without esophagitis: Secondary | ICD-10-CM

## 2020-04-22 DIAGNOSIS — E1165 Type 2 diabetes mellitus with hyperglycemia: Secondary | ICD-10-CM

## 2020-04-22 DIAGNOSIS — I10 Essential (primary) hypertension: Secondary | ICD-10-CM | POA: Diagnosis not present

## 2020-04-22 MED ORDER — PANTOPRAZOLE SODIUM 40 MG PO TBEC: 40.0000 mg | DELAYED_RELEASE_TABLET | Freq: Every day | ORAL | 0 refills | Status: DC

## 2020-04-22 MED ORDER — GUAIFENESIN-DM 100-10 MG/5ML PO SYRP: 10.0000 mL | ORAL_SOLUTION | ORAL | 0 refills | Status: DC | PRN

## 2020-04-22 MED ORDER — ALBUTEROL SULFATE HFA 108 (90 BASE) MCG/ACT IN AERS: 2.0000 | INHALATION_SPRAY | Freq: Four times a day (QID) | RESPIRATORY_TRACT | 2 refills | Status: AC | PRN

## 2020-04-22 MED ORDER — GLIPIZIDE 5 MG PO TABS: 5.0000 mg | ORAL_TABLET | Freq: Every day | ORAL | 0 refills | Status: DC

## 2020-04-22 MED ORDER — POLYETHYLENE GLYCOL 3350 17 G PO PACK: 17.0000 g | PACK | Freq: Every day | ORAL | 0 refills | Status: DC | PRN

## 2020-04-22 MED ORDER — ADULT MULTIVITAMIN W/MINERALS CH: 1.0000 | ORAL_TABLET | Freq: Every day | ORAL | 2 refills | Status: DC

## 2020-04-22 NOTE — Progress Notes (Signed)
Subjective:  Patient ID: Marcus [name], male    DOB: 1946-06-11  Age: 74 y.o. MRN: [id number]  CC:  Chief Complaint  Patient presents with  . Other    Post skilled nursing facility discharge      HPI  This patient arrives today for the above.  He was recently discharged from skilled nursing facility following hospitalization for COVID-19 pneumonia.  He walks in today without oxygen and oxygen saturations are in the low to mid 90s.  He tells me overall he is feeling much better but he still does experience fatigue especially with activity.  He is working with physical therapy at home.  He also has a history of type 2 diabetes and last A1c was collected approximately six weeks ago and it was slightly above 7.  He continues on glipizide and Metformin.  He is also on statin therapy.  Last metabolic panel in the system did show abnormal liver enzymes, and some normal electrolyte levels.  Believe this was during hospitalization.  He also has a history of atrial fibrillation and continues on metoprolol for rate control.  He is on aspirin but is not on any other anticoagulation due to concerns with cost.  This has been discussed with him multiple times and he is aware that recommendation with anticoagulation such as warfarin or DOAC is recommended.  He continues to refuse.  He has no acute concerns today.  Past Medical History:  Diagnosis Date  . AF (atrial fibrillation) ([place])   . Atrial flutter ([place])   . Blue toes    chronic  . Essential hypertension, benign 04/23/2019  . GERD (gastroesophageal reflux disease)   . Gout   . Guillain Barr syndrome ([place])   . Type II diabetes mellitus, uncontrolled ([place]) 04/23/2019      Family History  Problem Relation Age of Onset  . Diabetes Father   . Colon cancer Father   . Colon cancer Mother     Social History   Social History Narrative   Married for 78 years,lives with wife.Retired.   Social History   Tobacco Use  .  Smoking status: Former Smoker    Packs/day: 2.00    Years: 5.00    Pack years: 10.00    Types: Cigarettes    Start date: 11/23/2016    Quit date: 11/24/2016    Years since quitting: 3.4  . Smokeless tobacco: Never Used  Substance Use Topics  . Alcohol use: No     Current Meds  Medication Sig  . acetaminophen (TYLENOL) 325 MG tablet Take 2 tablets (650 mg total) by mouth every 6 (six) hours as needed for mild pain or headache (fever >/= 101).  Marland Kitchen albuterol (VENTOLIN HFA) 108 (90 Base) MCG/ACT inhaler Inhale 2 puffs into the lungs every 6 (six) hours as needed for wheezing or shortness of breath.  Marland Kitchen aspirin EC 81 MG tablet Take 1 tablet (81 mg total) by mouth daily.  Marland Kitchen glipiZIDE (GLUCOTROL) 5 MG tablet Take 1 tablet (5 mg total) by mouth daily.  Marland Kitchen glucose blood test strip Use to check your blood glucose once a day.  Marland Kitchen guaiFENesin-dextromethorphan (ROBITUSSIN DM) 100-10 MG/5ML syrup Take 10 mLs by mouth every 4 (four) hours as needed for cough.  . metFORMIN (GLUCOPHAGE-XR) 500 MG 24 hr tablet TAKE (1) TABLET TWICE DAILY. (Patient taking differently: Take 500 mg by mouth 2 (two) times daily. )  . metoprolol tartrate 75 MG TABS Take 75 mg by mouth 2 (  two) times daily.  . Multiple Vitamin (MULTIVITAMIN WITH MINERALS) TABS tablet Take 1 tablet by mouth daily.  . pantoprazole (PROTONIX) 40 MG tablet Take 1 tablet (40 mg total) by mouth daily.  . polyethylene glycol (MIRALAX / GLYCOLAX) 17 g packet Take 17 g by mouth daily as needed for mild constipation.  . pravastatin (PRAVACHOL) 20 MG tablet Take 1 tablet (20 mg total) by mouth daily.  . vitamin B-12 (CYANOCOBALAMIN) 500 MCG tablet Take 1 tablet (500 mcg total) by mouth daily.  . [DISCONTINUED] albuterol (VENTOLIN HFA) 108 (90 Base) MCG/ACT inhaler Inhale 2 puffs into the lungs every 6 (six) hours as needed for wheezing or shortness of breath.  . [DISCONTINUED] Multiple Vitamin (MULTIVITAMIN WITH MINERALS) TABS tablet Take 1 tablet by mouth daily.   . [DISCONTINUED] pantoprazole (PROTONIX) 40 MG tablet Take 1 tablet (40 mg total) by mouth daily.    ROS:  Review of Systems  Constitutional: Positive for malaise/fatigue.  Respiratory: Negative.   Cardiovascular: Negative.   Gastrointestinal: Negative.   Neurological: Negative.      Objective:   Today's Vitals: BP 138/70   Pulse 84   Temp (!) 97.3 F (36.3 C)   Resp 12   Ht 6' (1.829 m)   Wt 175 lb 6.4 oz (79.6 kg)   SpO2 93%   BMI 23.79 kg/m  Vitals with BMI 04/22/2020 03/17/2020 03/17/2020  Height _0  - -  Weight 175 lbs 6 oz - -  BMI 16.96 - -  Systolic [phone]  Diastolic 70 96 85  Pulse 84 78 64     Physical Exam Vitals reviewed.  Constitutional:      Appearance: Normal appearance.  HENT:     Head: Normocephalic and atraumatic.  Cardiovascular:     Rate and Rhythm: Normal rate. Rhythm irregular.     Heart sounds: Normal heart sounds.  Pulmonary:     Effort: Pulmonary effort is normal.     Breath sounds: Normal breath sounds.  Musculoskeletal:     Cervical back: Neck supple.  Skin:    General: Skin is warm and dry.  Neurological:     Mental Status: He is alert and oriented to person, place, and time.  Psychiatric:        Mood and Affect: Mood normal.        Behavior: Behavior normal.        Thought Content: Thought content normal.        Judgment: Judgment normal.          Assessment and Plan   1. Gastroesophageal reflux disease without esophagitis   2. COVID-19 virus infection   3. Uncontrolled type 2 diabetes mellitus with hyperglycemia ([place])   4. Atrial fibrillation, unspecified type ([place])      Plan: 1.  Refill for pantoprazole sent to pharmacy per patient request. 2.  He appears to be recovering well at this time.  Albuterol inhaler refilled to be used as needed.  We will continue to monitor him closely.  He will follow-up in 1 month for close following.  He was encouraged to continue working with physical therapy at home. 3.   It is too early to recheck A1c, but will check metabolic panel today to monitor electrolytes and kidney function.  He will continue on current medications as prescribed. 4.  Clinically does appear to be in atrial fibrillation today.  He will continue taking metoprolol as rate is well controlled as well as aspirin.  He was encouraged  to follow-up with cardiology as scheduled.   Tests ordered Orders Placed This Encounter  Procedures  . CMP with eGFR([facility])      Meds ordered this encounter  Medications  . pantoprazole (PROTONIX) 40 MG tablet    Sig: Take 1 tablet (40 mg total) by mouth daily.    Dispense:  90 tablet    Refill:  0    Order Specific Question:   Supervising Provider    Answer:   Hurshel Party C [[id number]]  . Multiple Vitamin (MULTIVITAMIN WITH MINERALS) TABS tablet    Sig: Take 1 tablet by mouth daily.    Dispense:  90 tablet    Refill:  2    Order Specific Question:   Supervising Provider    Answer:   Hurshel Party C [[id number]]  . albuterol (VENTOLIN HFA) 108 (90 Base) MCG/ACT inhaler    Sig: Inhale 2 puffs into the lungs every 6 (six) hours as needed for wheezing or shortness of breath.    Dispense:  8 g    Refill:  2    Order Specific Question:   Supervising Provider    Answer:   Doree Albee [[id number]]    Patient to follow-up in 1 month, or sooner as needed  Ailene Ards, NP

## 2020-04-23 ENCOUNTER — Other Ambulatory Visit (INDEPENDENT_AMBULATORY_CARE_PROVIDER_SITE_OTHER): Payer: Self-pay | Admitting: Nurse Practitioner

## 2020-04-23 DIAGNOSIS — E1165 Type 2 diabetes mellitus with hyperglycemia: Secondary | ICD-10-CM | POA: Diagnosis not present

## 2020-04-23 DIAGNOSIS — I4891 Unspecified atrial fibrillation: Secondary | ICD-10-CM | POA: Diagnosis not present

## 2020-04-23 DIAGNOSIS — I10 Essential (primary) hypertension: Secondary | ICD-10-CM | POA: Diagnosis not present

## 2020-04-23 DIAGNOSIS — U071 COVID-19: Secondary | ICD-10-CM | POA: Diagnosis not present

## 2020-04-23 DIAGNOSIS — J1282 Pneumonia due to coronavirus disease 2019: Secondary | ICD-10-CM | POA: Diagnosis not present

## 2020-04-23 DIAGNOSIS — E875 Hyperkalemia: Secondary | ICD-10-CM

## 2020-04-23 DIAGNOSIS — E1151 Type 2 diabetes mellitus with diabetic peripheral angiopathy without gangrene: Secondary | ICD-10-CM | POA: Diagnosis not present

## 2020-04-23 LAB — COMPLETE METABOLIC PANEL WITH GFR
AG Ratio: 1.9 (calc) (ref 1.0–2.5)
ALT: 14 U/L (ref 9–46)
AST: 14 U/L (ref 10–35)
Albumin: 4.1 g/dL (ref 3.6–5.1)
Alkaline phosphatase (APISO): 83 U/L (ref 35–144)
BUN/Creatinine Ratio: 23 (calc) — ABNORMAL HIGH (ref 6–22)
BUN: 30 mg/dL — ABNORMAL HIGH (ref 7–25)
CO2: 28 mmol/L (ref 20–32)
Calcium: 9.8 mg/dL (ref 8.6–10.3)
Chloride: 104 mmol/L (ref 98–110)
Creat: 1.29 mg/dL — ABNORMAL HIGH (ref 0.70–1.18)
GFR, Est African American: 62 mL/min/{1.73_m2} (ref >=60)
GFR, Est Non African American: 53 mL/min/{1.73_m2} — ABNORMAL LOW (ref >=60)
Globulin: 2.2 g/dL (calc) (ref 1.9–3.7)
Glucose, Bld: 187 mg/dL — ABNORMAL HIGH (ref 65–99)
Potassium: 5.9 mmol/L — ABNORMAL HIGH (ref 3.5–5.3)
Sodium: 146 mmol/L (ref 135–146)
Total Bilirubin: 0.4 mg/dL (ref 0.2–1.2)
Total Protein: 6.3 g/dL (ref 6.1–8.1)

## 2020-04-23 NOTE — Progress Notes (Signed)
Wife said she wanted to make sure he is getting a protonix, something for constipation. Was given instructions on lab work ; will be back in the morning to get re-drawn for blood work to check for dehydration.

## 2020-04-23 NOTE — Progress Notes (Signed)
Order for repeat CMP

## 2020-04-24 ENCOUNTER — Telehealth (INDEPENDENT_AMBULATORY_CARE_PROVIDER_SITE_OTHER): Payer: Self-pay

## 2020-04-24 ENCOUNTER — Other Ambulatory Visit: Payer: Self-pay

## 2020-04-24 ENCOUNTER — Other Ambulatory Visit (INDEPENDENT_AMBULATORY_CARE_PROVIDER_SITE_OTHER): Payer: Medicare Other

## 2020-04-24 DIAGNOSIS — E1165 Type 2 diabetes mellitus with hyperglycemia: Secondary | ICD-10-CM | POA: Diagnosis not present

## 2020-04-24 DIAGNOSIS — U071 COVID-19: Secondary | ICD-10-CM | POA: Diagnosis not present

## 2020-04-24 DIAGNOSIS — I4891 Unspecified atrial fibrillation: Secondary | ICD-10-CM | POA: Diagnosis not present

## 2020-04-24 DIAGNOSIS — E785 Hyperlipidemia, unspecified: Secondary | ICD-10-CM | POA: Diagnosis not present

## 2020-04-24 DIAGNOSIS — J1282 Pneumonia due to coronavirus disease 2019: Secondary | ICD-10-CM | POA: Diagnosis not present

## 2020-04-24 DIAGNOSIS — E1151 Type 2 diabetes mellitus with diabetic peripheral angiopathy without gangrene: Secondary | ICD-10-CM | POA: Diagnosis not present

## 2020-04-24 DIAGNOSIS — I10 Essential (primary) hypertension: Secondary | ICD-10-CM | POA: Diagnosis not present

## 2020-04-24 DIAGNOSIS — E875 Hyperkalemia: Secondary | ICD-10-CM | POA: Diagnosis not present

## 2020-04-24 NOTE — Telephone Encounter (Signed)
[facility] Rosary Lively to get a verbal order for the following:  Nursing /CNA 2x 2 wks; 2x 7wks for eval/ treat in the home. M:[phone] or P:[phone].

## 2020-04-25 ENCOUNTER — Other Ambulatory Visit: Payer: Self-pay

## 2020-04-25 ENCOUNTER — Encounter (HOSPITAL_COMMUNITY): Payer: Self-pay

## 2020-04-25 ENCOUNTER — Emergency Department (HOSPITAL_COMMUNITY)
Admission: EM | Admit: 2020-04-25 | Discharge: 2020-04-25 | Disposition: A | Discharge status: Home / Self Care | Payer: Medicare Other | Attending: Emergency Medicine | Admitting: Emergency Medicine

## 2020-04-25 ENCOUNTER — Telehealth (INDEPENDENT_AMBULATORY_CARE_PROVIDER_SITE_OTHER): Payer: Self-pay | Admitting: Nurse Practitioner

## 2020-04-25 DIAGNOSIS — Z8616 Personal history of COVID-19: Secondary | ICD-10-CM | POA: Insufficient documentation

## 2020-04-25 DIAGNOSIS — Z7984 Long term (current) use of oral hypoglycemic drugs: Secondary | ICD-10-CM | POA: Insufficient documentation

## 2020-04-25 DIAGNOSIS — I10 Essential (primary) hypertension: Secondary | ICD-10-CM | POA: Diagnosis not present

## 2020-04-25 DIAGNOSIS — Z87891 Personal history of nicotine dependence: Secondary | ICD-10-CM | POA: Insufficient documentation

## 2020-04-25 DIAGNOSIS — Z7982 Long term (current) use of aspirin: Secondary | ICD-10-CM | POA: Diagnosis not present

## 2020-04-25 DIAGNOSIS — R799 Abnormal finding of blood chemistry, unspecified: Secondary | ICD-10-CM | POA: Diagnosis present

## 2020-04-25 DIAGNOSIS — E875 Hyperkalemia: Secondary | ICD-10-CM | POA: Insufficient documentation

## 2020-04-25 DIAGNOSIS — Z79899 Other long term (current) drug therapy: Secondary | ICD-10-CM | POA: Diagnosis not present

## 2020-04-25 DIAGNOSIS — E119 Type 2 diabetes mellitus without complications: Secondary | ICD-10-CM | POA: Diagnosis not present

## 2020-04-25 LAB — COMPLETE METABOLIC PANEL WITH GFR
AG Ratio: 2 (calc) (ref 1.0–2.5)
ALT: 14 U/L (ref 9–46)
AST: 15 U/L (ref 10–35)
Albumin: 4.3 g/dL (ref 3.6–5.1)
Alkaline phosphatase (APISO): 82 U/L (ref 35–144)
BUN/Creatinine Ratio: 21 (calc) (ref 6–22)
BUN: 31 mg/dL — ABNORMAL HIGH (ref 7–25)
CO2: 29 mmol/L (ref 20–32)
Calcium: 10.4 mg/dL — ABNORMAL HIGH (ref 8.6–10.3)
Chloride: 99 mmol/L (ref 98–110)
Creat: 1.45 mg/dL — ABNORMAL HIGH (ref 0.70–1.18)
GFR, Est African American: 54 mL/min/{1.73_m2} — ABNORMAL LOW (ref >=60)
GFR, Est Non African American: 46 mL/min/{1.73_m2} — ABNORMAL LOW (ref >=60)
Globulin: 2.2 g/dL (calc) (ref 1.9–3.7)
Glucose, Bld: 321 mg/dL — ABNORMAL HIGH (ref 65–99)
Potassium: 6.1 mmol/L — ABNORMAL HIGH (ref 3.5–5.3)
Sodium: 141 mmol/L (ref 135–146)
Total Bilirubin: 0.8 mg/dL (ref 0.2–1.2)
Total Protein: 6.5 g/dL (ref 6.1–8.1)

## 2020-04-25 LAB — CBC
HCT: 46.3 % (ref 39.0–52.0)
Hemoglobin: 15.4 g/dL (ref 13.0–17.0)
MCH: 31.5 pg (ref 26.0–34.0)
MCHC: 33.3 g/dL (ref 30.0–36.0)
MCV: 94.7 fL (ref 80.0–100.0)
Platelets: 239 10*3/uL (ref 150–400)
RBC: 4.89 MIL/uL (ref 4.22–5.81)
RDW: 14.6 % (ref 11.5–15.5)
WBC: 7.2 10*3/uL (ref 4.0–10.5)
nRBC: 0 % (ref 0.0–0.2)

## 2020-04-25 LAB — COMPREHENSIVE METABOLIC PANEL
ALT: 17 U/L (ref 0–44)
AST: 17 U/L (ref 15–41)
Albumin: 3.8 g/dL (ref 3.5–5.0)
Alkaline Phosphatase: 78 U/L (ref 38–126)
Anion gap: 10 (ref 5–15)
BUN: 31 mg/dL — ABNORMAL HIGH (ref 8–23)
CO2: 26 mmol/L (ref 22–32)
Calcium: 9.7 mg/dL (ref 8.9–10.3)
Chloride: 102 mmol/L (ref 98–111)
Creatinine, Ser: 1.19 mg/dL (ref 0.61–1.24)
GFR calc Af Amer: >60 mL/min (ref >60)
GFR calc non Af Amer: 59 mL/min — ABNORMAL LOW (ref >60)
Glucose, Bld: 197 mg/dL — ABNORMAL HIGH (ref 70–99)
Potassium: 4.7 mmol/L (ref 3.5–5.1)
Sodium: 138 mmol/L (ref 135–145)
Total Bilirubin: 0.6 mg/dL (ref 0.3–1.2)
Total Protein: 7.1 g/dL (ref 6.5–8.1)

## 2020-04-25 LAB — LIPID PANEL
Cholesterol: 155 mg/dL (ref <200)
HDL: 39 mg/dL — ABNORMAL LOW (ref >=40)
LDL Cholesterol (Calc): 85 mg/dL (calc)
Non-HDL Cholesterol (Calc): 116 mg/dL (calc) (ref <130)
Total CHOL/HDL Ratio: 4 (calc) (ref <5.0)
Triglycerides: 225 mg/dL — ABNORMAL HIGH (ref <150)

## 2020-04-25 NOTE — Discharge Instructions (Addendum)
Please return to the ER with any new or worsening symptoms. It was a pleasure to meet you.

## 2020-04-25 NOTE — ED Provider Notes (Signed)
[facility] EMERGENCY DEPARTMENT Provider Note   CSN: [id number] Arrival date & time: 04/25/20  1031     History Chief Complaint  Patient presents with  . Abnormal Lab    Marcus [name] is a 74 y.o. male.  HPI   Patient is a 74 year old male with a medical history as noted below.  Patient recently was admitted for COVID-19 and was ultimately sent to a skilled nursing facility from which he was discharged 1 week ago.  Since being discharged he reports some waxing and waning chronic fatigue but otherwise has no complaints.  He has been closely followed by his PCP.  He had labs drawn multiple times and they noted a worsening creatinine as well as elevated potassium.  His most recent potassium was 6.1.  His most recent creatinine was 1.45 with a GFR of 46.  Patient was called by his PCP this morning and instructed to come to the emergency department for further evaluation.  He had a CBC and CMP repeated upon arrival.  His current potassium is within normal limits of 4.7 and his creatinine improved to 1.19.    Past Medical History:  Diagnosis Date  . AF (atrial fibrillation) ([place])   . Atrial flutter ([place])   . Blue toes    chronic  . Essential hypertension, benign 04/23/2019  . GERD (gastroesophageal reflux disease)   . Gout   . Guillain Barr syndrome ([place])   . Type II diabetes mellitus, uncontrolled ([place]) 04/23/2019    Patient Active Problem List   Diagnosis Date Noted  . Weakness 03/14/2020  . COVID toes 03/14/2020  . AKI (acute kidney injury) ([place])   . Pneumonia due to COVID-19 virus 03/04/2020  . Acute metabolic encephalopathy 94/17/4081  . COVID-19 virus infection 03/03/2020  . History of colonic polyps 08/21/2019  . Peripheral vascular disease ([place]) 08/01/2019  . Type II diabetes mellitus, uncontrolled ([place]) 04/23/2019  . Essential hypertension, benign 04/23/2019  . C. difficile colitis 11/09/2017  . Focal neurological deficit 07/21/2013  . GBS (Guillain  Barre syndrome) ([place]) 07/21/2013  . Leg weakness 07/21/2013  . Femur fracture ([place]) 06/25/2013  . Femur fracture, right ([place]) 06/25/2013  . GOUT 07/31/2009  . AF (atrial fibrillation) ([place]) 07/31/2009  . Atrial flutter ([place]) 07/31/2009    Past Surgical History:  Procedure Laterality Date  . catheter ablation  12/23/2004  . COLONOSCOPY N/A 09/18/2014   Procedure: COLONOSCOPY;  Surgeon: Rogene Houston, MD;  Location[facility];  Service: Endoscopy;  Laterality: N/A;  830  . EYE SURGERY  2020   Bilateral cataract surgery  . FEMUR IM NAIL Right 06/25/2013   Procedure: INTRAMEDULLARY (IM) RETROGRADE FEMORAL NAILING;  Surgeon: Marybelle Killings, MD;  Location: [facility];  Service: Orthopedics;  Laterality: Right;       Family History  Problem Relation Age of Onset  . Diabetes Father   . Colon cancer Father   . Colon cancer Mother     Social History   Tobacco Use  . Smoking status: Former Smoker    Packs/day: 2.00    Years: 5.00    Pack years: 10.00    Types: Cigarettes    Start date: 11/23/2016    Quit date: 11/24/2016    Years since quitting: 3.4  . Smokeless tobacco: Never Used  Vaping Use  . Vaping Use: Never used  Substance Use Topics  . Alcohol use: No  . Drug use: No    Home Medications Prior to Admission medications  Medication Sig Start Date End Date Taking? Authorizing Provider  acetaminophen (TYLENOL) 325 MG tablet Take 2 tablets (650 mg total) by mouth every 6 (six) hours as needed for mild pain or headache (fever >/= 101). 03/17/20   Johnson, Clanford L, MD  albuterol (VENTOLIN HFA) 108 (90 Base) MCG/ACT inhaler Inhale 2 puffs into the lungs every 6 (six) hours as needed for wheezing or shortness of breath. 04/22/20   Ailene Ards, NP  aspirin EC 81 MG tablet Take 1 tablet (81 mg total) by mouth daily. 12/22/16   Evans Lance, MD  dexamethasone (DECADRON) 2 MG tablet Take 3 tab daily x 7 days, then 2 tab daily x 5 days, then 1 tab daily x 5 days Patient not taking:  Reported on 04/22/2020 03/18/20   Irwin Brakeman L, MD  glipiZIDE (GLUCOTROL) 5 MG tablet Take 1 tablet (5 mg total) by mouth daily. 04/22/20 05/22/20  Ailene Ards, NP  glucose blood test strip Use to check your blood glucose once a day. 01/10/20   Ailene Ards, NP  guaiFENesin-dextromethorphan (ROBITUSSIN DM) 100-10 MG/5ML syrup Take 10 mLs by mouth every 4 (four) hours as needed for cough. 04/22/20   Ailene Ards, NP  metFORMIN (GLUCOPHAGE-XR) 500 MG 24 hr tablet TAKE (1) TABLET TWICE DAILY. Patient taking differently: Take 500 mg by mouth 2 (two) times daily.  12/17/19   Doree Albee, MD  metoprolol tartrate 75 MG TABS Take 75 mg by mouth 2 (two) times daily. 03/05/20   Orson Eva, MD  Multiple Vitamin (MULTIVITAMIN WITH MINERALS) TABS tablet Take 1 tablet by mouth daily. 04/22/20   Ailene Ards, NP  pantoprazole (PROTONIX) 40 MG tablet Take 1 tablet (40 mg total) by mouth daily. 04/22/20 05/22/20  Ailene Ards, NP  polyethylene glycol (MIRALAX / GLYCOLAX) 17 g packet Take 17 g by mouth daily as needed for mild constipation. 04/22/20   Ailene Ards, NP  pravastatin (PRAVACHOL) 20 MG tablet Take 1 tablet (20 mg total) by mouth daily. 11/08/19   Ailene Ards, NP  vitamin B-12 (CYANOCOBALAMIN) 500 MCG tablet Take 1 tablet (500 mcg total) by mouth daily. 03/06/20   Orson Eva, MD  glipiZIDE (GLUCOTROL) 5 MG tablet Take 5 mg by mouth daily before breakfast.    [provider]  glipiZIDE (GLUCOTROL) 5 MG tablet TAKE 1 TABLET ONCE DAILY. 05/28/19   Gosrani, Nimish C, MD  glipiZIDE (GLUCOTROL) 5 MG tablet TAKE 1 TABLET DAILY. 11/23/19   Doree Albee, MD  metFORMIN (GLUCOPHAGE XR) 500 MG 24 hr tablet Take 1 tablet (500 mg total) by mouth 2 (two) times a day. 01/19/19   Noemi Chapel, MD  metFORMIN (GLUCOPHAGE-XR) 500 MG 24 hr tablet TAKE (1) TABLET TWICE DAILY. 07/21/19   Doree Albee, MD    Allergies    Influenza vaccines  Review of Systems   Review of Systems  All other  systems reviewed and are negative. Ten systems reviewed and are negative for acute change, except as noted in the HPI.   Physical Exam Updated Vital Signs BP 112/87 (BP Location: Right Arm)   Pulse (!) 102   Temp 97.7 F (36.5 C) (Oral)   Resp 18   Ht 6' (1.829 m)   Wt 78.9 kg   SpO2 100%   BMI 23.60 kg/m   Physical Exam Vitals and nursing note reviewed.  Constitutional:      General: He is not in acute distress.  Appearance: Normal appearance. He is not ill-appearing, toxic-appearing or diaphoretic.  HENT:     Head: Normocephalic and atraumatic.     Right Ear: External ear normal.     Left Ear: External ear normal.     Nose: Nose normal.     Mouth/Throat:     Mouth: Mucous membranes are moist.     Pharynx: Oropharynx is clear. No oropharyngeal exudate or posterior oropharyngeal erythema.  Eyes:     Extraocular Movements: Extraocular movements intact.  Cardiovascular:     Rate and Rhythm: Normal rate. Rhythm irregular.     Pulses: Normal pulses.     Heart sounds: Normal heart sounds. No murmur heard.  No friction rub. No gallop.   Pulmonary:     Effort: Pulmonary effort is normal. No respiratory distress.     Breath sounds: Normal breath sounds. No stridor. No wheezing, rhonchi or rales.  Abdominal:     General: Abdomen is flat.     Palpations: Abdomen is soft.     Tenderness: There is no abdominal tenderness.  Musculoskeletal:        General: Normal range of motion.     Cervical back: Normal range of motion and neck supple. No tenderness.  Skin:    General: Skin is warm and dry.  Neurological:     General: No focal deficit present.     Mental Status: He is alert and oriented to person, place, and time.  Psychiatric:        Mood and Affect: Mood normal.        Behavior: Behavior normal.    ED Results / Procedures / Treatments   Labs (all labs ordered are listed, but only abnormal results are displayed) Labs Reviewed  COMPREHENSIVE METABOLIC PANEL -  Abnormal; Notable for the following components:      Result Value   Glucose, Bld 197 (*)    BUN 31 (*)    GFR calc non Af Amer 59 (*)    All other components within normal limits  CBC   EKG None  Radiology No results found.  Procedures Procedures (including critical care time)  Medications Ordered in ED Medications - No data to display  ED Course  I have reviewed the triage vital signs and the nursing notes.  Pertinent labs & imaging results that were available during my care of the patient were reviewed by me and considered in my medical decision making (see chart for details).  Clinical Course as of Apr 26 1515  Fri Apr 25, 2020  1502 Improved from 6.1 yesterday  Potassium: 4.7 [LJ]  1502 Improved from 1.45 yesterday  Creatinine: 1.19 [LJ]  1502 Improved from 46 yesterday  GFR, Est Non African American(!): 59 [LJ]  1503 Similar to baseline values  BUN(!): 31 [LJ]    Clinical Course User Index [LJ] Rayna Sexton, PA-C   MDM Rules/Calculators/A&P                          Pt is a 74 y.o. male that presents with a history, physical exam, and ED Clinical Course as noted above.   Patient presents today for hyperkalemia as well as an elevated creatinine.  He was called by his PCP earlier today and they recommended that he come to the emergency department for further evaluation.  CBC and CMP were obtained.  His potassium has normalized.  Creatinine has also normalized.  GFR is improved to 59.  He is uremic  at 76 which appears to be similar to his prior values.  He was irregularly irregular at today's visit but this appears to be baseline for patient based on his records. He is on aspirin but has declined other anticoagulation in the past due to cost concerns.  Physical exam today is reassuring.  Patient notes some chronic fatigue since his most recent admission for COVID-19.  Otherwise, he has no complaints.  Lungs are clear to auscultation.    I discussed patient's  lab results in length with him.  We discussed the possibility of him being discharged at this time and he is amenable.  Recommended that he follow-up with his PCP first thing on Monday morning to discuss the results of his lab work at today's visit.  His questions were answered and he was amicable at the time of discharge.  His vital signs are stable.   An After Visit Summary was printed and given to the patient.  Patient discharged to home/self care.  Condition at discharge: Stable  Note: Portions of this report may have been transcribed using voice recognition software. Every effort was made to ensure accuracy; however, inadvertent computerized transcription errors may be present.   Final Clinical Impression(s) / ED Diagnoses Final diagnoses:  Hyperkalemia   Rx / DC Orders ED Discharge Orders    None       Rayna Sexton, PA-C 04/25/20 1517    Milton Ferguson, MD 04/27/20 [phone]

## 2020-04-25 NOTE — ED Triage Notes (Signed)
Pt is here from Dr. Peggyann Juba office for elevated potassium and worsening kidney function. Pt is alert and oriented and stable.

## 2020-04-25 NOTE — Telephone Encounter (Signed)
I was able to contact this patient's wife regarding the patient's blood work.  I did discuss the results with Dr. Anastasio Champion as well based on upward trend of potassium and worsening kidney function recommendation is to have patient go to the emergency department to have this evaluated.  The wife tells me she understands and is going to wake the patient in a few moments and bring him to the emergency department.  We will have him scheduled for close follow-up this upcoming Wednesday.

## 2020-04-28 DIAGNOSIS — U071 COVID-19: Secondary | ICD-10-CM | POA: Diagnosis not present

## 2020-04-28 DIAGNOSIS — E1151 Type 2 diabetes mellitus with diabetic peripheral angiopathy without gangrene: Secondary | ICD-10-CM | POA: Diagnosis not present

## 2020-04-28 DIAGNOSIS — I10 Essential (primary) hypertension: Secondary | ICD-10-CM | POA: Diagnosis not present

## 2020-04-28 DIAGNOSIS — E1165 Type 2 diabetes mellitus with hyperglycemia: Secondary | ICD-10-CM | POA: Diagnosis not present

## 2020-04-28 DIAGNOSIS — I4891 Unspecified atrial fibrillation: Secondary | ICD-10-CM | POA: Diagnosis not present

## 2020-04-28 DIAGNOSIS — J1282 Pneumonia due to coronavirus disease 2019: Secondary | ICD-10-CM | POA: Diagnosis not present

## 2020-04-29 DIAGNOSIS — J1282 Pneumonia due to coronavirus disease 2019: Secondary | ICD-10-CM | POA: Diagnosis not present

## 2020-04-29 DIAGNOSIS — I4891 Unspecified atrial fibrillation: Secondary | ICD-10-CM | POA: Diagnosis not present

## 2020-04-29 DIAGNOSIS — E1151 Type 2 diabetes mellitus with diabetic peripheral angiopathy without gangrene: Secondary | ICD-10-CM | POA: Diagnosis not present

## 2020-04-29 DIAGNOSIS — U071 COVID-19: Secondary | ICD-10-CM | POA: Diagnosis not present

## 2020-04-29 DIAGNOSIS — I10 Essential (primary) hypertension: Secondary | ICD-10-CM | POA: Diagnosis not present

## 2020-04-29 DIAGNOSIS — E1165 Type 2 diabetes mellitus with hyperglycemia: Secondary | ICD-10-CM | POA: Diagnosis not present

## 2020-04-30 ENCOUNTER — Other Ambulatory Visit: Payer: Self-pay

## 2020-04-30 ENCOUNTER — Encounter (INDEPENDENT_AMBULATORY_CARE_PROVIDER_SITE_OTHER): Payer: Self-pay | Admitting: Internal Medicine

## 2020-04-30 ENCOUNTER — Other Ambulatory Visit: Payer: Self-pay | Admitting: *Deleted

## 2020-04-30 ENCOUNTER — Ambulatory Visit (INDEPENDENT_AMBULATORY_CARE_PROVIDER_SITE_OTHER): Payer: Medicare Other | Admitting: Internal Medicine

## 2020-04-30 VITALS — BP 108/70 | HR 91 | Ht 72.0 in | Wt 177.4 lb

## 2020-04-30 DIAGNOSIS — E1151 Type 2 diabetes mellitus with diabetic peripheral angiopathy without gangrene: Secondary | ICD-10-CM | POA: Diagnosis not present

## 2020-04-30 DIAGNOSIS — J1282 Pneumonia due to coronavirus disease 2019: Secondary | ICD-10-CM | POA: Diagnosis not present

## 2020-04-30 DIAGNOSIS — K219 Gastro-esophageal reflux disease without esophagitis: Secondary | ICD-10-CM

## 2020-04-30 DIAGNOSIS — I4891 Unspecified atrial fibrillation: Secondary | ICD-10-CM | POA: Diagnosis not present

## 2020-04-30 DIAGNOSIS — U071 COVID-19: Secondary | ICD-10-CM | POA: Diagnosis not present

## 2020-04-30 DIAGNOSIS — I10 Essential (primary) hypertension: Secondary | ICD-10-CM | POA: Diagnosis not present

## 2020-04-30 DIAGNOSIS — E1165 Type 2 diabetes mellitus with hyperglycemia: Secondary | ICD-10-CM

## 2020-04-30 DIAGNOSIS — E875 Hyperkalemia: Secondary | ICD-10-CM

## 2020-04-30 MED ORDER — PANTOPRAZOLE SODIUM 40 MG PO TBEC: 40.0000 mg | DELAYED_RELEASE_TABLET | Freq: Every day | ORAL | 0 refills | Status: DC

## 2020-04-30 NOTE — Progress Notes (Signed)
Metrics: Intervention Frequency ACO  Documented Smoking Status Yearly  Screened one or more times in 24 months  Cessation Counseling or  Active cessation medication Past 24 months  Past 24 months   Guideline developer: UpToDate (See UpToDate for funding source) Date Released: 2014       Wellness Office Visit  Subjective:  Patient ID: Marcus [name], male    DOB: 04-Jul-1946  Age: 74 y.o. MRN: [id number]  CC: This man comes in for follow-up after he was sent to the emergency room with hyperkalemia. HPI  At the hospital, his potassium was noted to be normal and so was his creatinine.  I think he had gotten dehydrated. He needs refill of Protonix for his gastroesophageal reflux disease. His last hemoglobin A1c was 7.3% as far as his diabetes is concerned. Past Medical History:  Diagnosis Date  . AF (atrial fibrillation) ([place])   . Atrial flutter ([place])   . Blue toes    chronic  . Essential hypertension, benign 04/23/2019  . GERD (gastroesophageal reflux disease)   . Gout   . Guillain Barr syndrome ([place])   . Type II diabetes mellitus, uncontrolled ([place]) 04/23/2019   Past Surgical History:  Procedure Laterality Date  . catheter ablation  12/23/2004  . COLONOSCOPY N/A 09/18/2014   Procedure: COLONOSCOPY;  Surgeon: Rogene Houston, MD;  [facility];  Service: Endoscopy;  Laterality: N/A;  830  . EYE SURGERY  2020   Bilateral cataract surgery  . FEMUR IM NAIL Right 06/25/2013   Procedure: INTRAMEDULLARY (IM) RETROGRADE FEMORAL NAILING;  Surgeon: Marybelle Killings, MD;  Location: [facility];  Service: Orthopedics;  Laterality: Right;     Family History  Problem Relation Age of Onset  . Diabetes Father   . Colon cancer Father   . Colon cancer Mother     Social History   Social History Narrative   Married for 81 years,lives with wife.Retired.   Social History   Tobacco Use  . Smoking status: Former Smoker    Packs/day: 2.00    Years: 5.00    Pack years: 10.00     Types: Cigarettes    Start date: 11/23/2016    Quit date: 11/24/2016    Years since quitting: 3.4  . Smokeless tobacco: Never Used  Substance Use Topics  . Alcohol use: No    Current Meds  Medication Sig  . albuterol (VENTOLIN HFA) 108 (90 Base) MCG/ACT inhaler Inhale 2 puffs into the lungs every 6 (six) hours as needed for wheezing or shortness of breath.  Marland Kitchen aspirin EC 81 MG tablet Take 1 tablet (81 mg total) by mouth daily.  Marland Kitchen glipiZIDE (GLUCOTROL) 5 MG tablet Take 1 tablet (5 mg total) by mouth daily.  Marland Kitchen glucose blood test strip Use to check your blood glucose once a day.  . metFORMIN (GLUCOPHAGE-XR) 500 MG 24 hr tablet TAKE (1) TABLET TWICE DAILY. (Patient taking differently: Take 500 mg by mouth 2 (two) times daily. )  . metoprolol tartrate 75 MG TABS Take 75 mg by mouth 2 (two) times daily.  . Multiple Vitamin (MULTIVITAMIN WITH MINERALS) TABS tablet Take 1 tablet by mouth daily.  . pantoprazole (PROTONIX) 40 MG tablet Take 1 tablet (40 mg total) by mouth daily.  . polyethylene glycol (MIRALAX / GLYCOLAX) 17 g packet Take 17 g by mouth daily as needed for mild constipation.  . pravastatin (PRAVACHOL) 20 MG tablet Take 1 tablet (20 mg total) by mouth daily.  . vitamin  B-12 (CYANOCOBALAMIN) 500 MCG tablet Take 1 tablet (500 mcg total) by mouth daily.  . [DISCONTINUED] pantoprazole (PROTONIX) 40 MG tablet Take 1 tablet (40 mg total) by mouth daily.      Depression screen PHQ 2/9 08/01/2019  Decreased Interest 0  Down, Depressed, Hopeless 0  PHQ - 2 Score 0     Objective:   Today's Vitals: BP 108/70   Pulse 91   Ht 6' (1.829 m)   Wt 177 lb 6.4 oz (80.5 kg)   SpO2 97%   BMI 24.06 kg/m  Vitals with BMI 04/30/2020 04/25/2020 04/25/2020  Height 6' 0"  - 6' 0"   Weight 177 lbs 6 oz - 174 lbs  BMI 70.78 - 67.54  Systolic 492 010 -  Diastolic 70 78 -  Pulse 91 91 -     Physical Exam  He looks systemically well.  Blood pressure is on the soft side for a man of his age.  But  he does look hemodynamically stable.  He is alert and orientated.     Assessment   1. Hyperkalemia   2. Gastroesophageal reflux disease without esophagitis   3. Uncontrolled type 2 diabetes mellitus with hyperglycemia ([place])       Tests ordered No orders of the defined types were placed in this encounter.    Plan: 1. I have reinforced the idea that he needs to be well-hydrated and drink 80 to 90 ounces of water every day. 2. I have refilled his Protonix for his gastroesophageal reflux disease.  This is keeping his symptoms under control. 3. As far as his diabetes is concerned, I have encouraged him to eat healthier and avoid sugary foods. 4. Follow-up as scheduled with Sarah.   Meds ordered this encounter  Medications  . pantoprazole (PROTONIX) 40 MG tablet    Sig: Take 1 tablet (40 mg total) by mouth daily.    Dispense:  90 tablet    Refill:  0    Katee Wentland Luther Parody, MD

## 2020-04-30 NOTE — Patient Outreach (Signed)
[facility] Post- Acute Care Coordinator follow up. Member screened for potential [facility] Care Management needs as a benefit of [place] Medicare.  Verified in Patient Pear[place] that member transitioned home from [facility] on 04/18/20.  Telephone call made to Mr. Sharlett Iles [phone]. Patient identifiers confirmed. Explained [place] Management services. Mr. Silber declines [place] Management services. States he has had "enough follow up." Denies having any current [facility] Care Management needs.   Will sign off as member declines [place] Management services.    Marthenia Rolling, MSN-Ed, RN,BSN [place] Acute Care Coordinator [phone] [facility]) [phone]  (Toll free office)

## 2020-05-02 DIAGNOSIS — J1282 Pneumonia due to coronavirus disease 2019: Secondary | ICD-10-CM | POA: Diagnosis not present

## 2020-05-02 DIAGNOSIS — E1151 Type 2 diabetes mellitus with diabetic peripheral angiopathy without gangrene: Secondary | ICD-10-CM | POA: Diagnosis not present

## 2020-05-02 DIAGNOSIS — U071 COVID-19: Secondary | ICD-10-CM | POA: Diagnosis not present

## 2020-05-02 DIAGNOSIS — E1165 Type 2 diabetes mellitus with hyperglycemia: Secondary | ICD-10-CM | POA: Diagnosis not present

## 2020-05-02 DIAGNOSIS — I4891 Unspecified atrial fibrillation: Secondary | ICD-10-CM | POA: Diagnosis not present

## 2020-05-02 DIAGNOSIS — I10 Essential (primary) hypertension: Secondary | ICD-10-CM | POA: Diagnosis not present

## 2020-05-04 ENCOUNTER — Other Ambulatory Visit (INDEPENDENT_AMBULATORY_CARE_PROVIDER_SITE_OTHER): Payer: Self-pay | Admitting: Internal Medicine

## 2020-05-05 ENCOUNTER — Other Ambulatory Visit (INDEPENDENT_AMBULATORY_CARE_PROVIDER_SITE_OTHER): Payer: Self-pay

## 2020-05-05 DIAGNOSIS — J1282 Pneumonia due to coronavirus disease 2019: Secondary | ICD-10-CM | POA: Diagnosis not present

## 2020-05-05 DIAGNOSIS — E1151 Type 2 diabetes mellitus with diabetic peripheral angiopathy without gangrene: Secondary | ICD-10-CM | POA: Diagnosis not present

## 2020-05-05 DIAGNOSIS — I4891 Unspecified atrial fibrillation: Secondary | ICD-10-CM | POA: Diagnosis not present

## 2020-05-05 DIAGNOSIS — K219 Gastro-esophageal reflux disease without esophagitis: Secondary | ICD-10-CM

## 2020-05-05 DIAGNOSIS — I10 Essential (primary) hypertension: Secondary | ICD-10-CM | POA: Diagnosis not present

## 2020-05-05 DIAGNOSIS — E1165 Type 2 diabetes mellitus with hyperglycemia: Secondary | ICD-10-CM | POA: Diagnosis not present

## 2020-05-05 DIAGNOSIS — U071 COVID-19: Secondary | ICD-10-CM | POA: Diagnosis not present

## 2020-05-05 MED ORDER — PANTOPRAZOLE SODIUM 40 MG PO TBEC: 40.0000 mg | DELAYED_RELEASE_TABLET | Freq: Every day | ORAL | 0 refills | Status: DC

## 2020-05-06 DIAGNOSIS — E1165 Type 2 diabetes mellitus with hyperglycemia: Secondary | ICD-10-CM | POA: Diagnosis not present

## 2020-05-06 DIAGNOSIS — I4891 Unspecified atrial fibrillation: Secondary | ICD-10-CM | POA: Diagnosis not present

## 2020-05-06 DIAGNOSIS — U071 COVID-19: Secondary | ICD-10-CM | POA: Diagnosis not present

## 2020-05-06 DIAGNOSIS — E1151 Type 2 diabetes mellitus with diabetic peripheral angiopathy without gangrene: Secondary | ICD-10-CM | POA: Diagnosis not present

## 2020-05-06 DIAGNOSIS — I10 Essential (primary) hypertension: Secondary | ICD-10-CM | POA: Diagnosis not present

## 2020-05-06 DIAGNOSIS — J1282 Pneumonia due to coronavirus disease 2019: Secondary | ICD-10-CM | POA: Diagnosis not present

## 2020-05-07 DIAGNOSIS — H2511 Age-related nuclear cataract, right eye: Secondary | ICD-10-CM

## 2020-05-07 DIAGNOSIS — H43812 Vitreous degeneration, left eye: Secondary | ICD-10-CM | POA: Insufficient documentation

## 2020-05-07 DIAGNOSIS — H25042 Posterior subcapsular polar age-related cataract, left eye: Secondary | ICD-10-CM

## 2020-05-07 DIAGNOSIS — H43811 Vitreous degeneration, right eye: Secondary | ICD-10-CM | POA: Insufficient documentation

## 2020-05-07 DIAGNOSIS — H2512 Age-related nuclear cataract, left eye: Secondary | ICD-10-CM

## 2020-05-07 HISTORY — DX: Posterior subcapsular polar age-related cataract, left eye: H25.042

## 2020-05-07 HISTORY — DX: Age-related nuclear cataract, right eye: H25.11

## 2020-05-07 HISTORY — DX: Age-related nuclear cataract, left eye: H25.12

## 2020-05-08 DIAGNOSIS — I10 Essential (primary) hypertension: Secondary | ICD-10-CM | POA: Diagnosis not present

## 2020-05-08 DIAGNOSIS — E1151 Type 2 diabetes mellitus with diabetic peripheral angiopathy without gangrene: Secondary | ICD-10-CM | POA: Diagnosis not present

## 2020-05-08 DIAGNOSIS — I4891 Unspecified atrial fibrillation: Secondary | ICD-10-CM | POA: Diagnosis not present

## 2020-05-08 DIAGNOSIS — U071 COVID-19: Secondary | ICD-10-CM | POA: Diagnosis not present

## 2020-05-08 DIAGNOSIS — E1165 Type 2 diabetes mellitus with hyperglycemia: Secondary | ICD-10-CM | POA: Diagnosis not present

## 2020-05-08 DIAGNOSIS — J1282 Pneumonia due to coronavirus disease 2019: Secondary | ICD-10-CM | POA: Diagnosis not present

## 2020-05-12 ENCOUNTER — Telehealth (INDEPENDENT_AMBULATORY_CARE_PROVIDER_SITE_OTHER): Payer: Self-pay

## 2020-05-12 DIAGNOSIS — K219 Gastro-esophageal reflux disease without esophagitis: Secondary | ICD-10-CM | POA: Diagnosis not present

## 2020-05-12 DIAGNOSIS — I4892 Unspecified atrial flutter: Secondary | ICD-10-CM | POA: Diagnosis not present

## 2020-05-12 DIAGNOSIS — R630 Anorexia: Secondary | ICD-10-CM | POA: Diagnosis not present

## 2020-05-12 DIAGNOSIS — G610 Guillain-Barre syndrome: Secondary | ICD-10-CM | POA: Diagnosis not present

## 2020-05-12 DIAGNOSIS — I10 Essential (primary) hypertension: Secondary | ICD-10-CM | POA: Diagnosis not present

## 2020-05-12 DIAGNOSIS — E1151 Type 2 diabetes mellitus with diabetic peripheral angiopathy without gangrene: Secondary | ICD-10-CM | POA: Diagnosis not present

## 2020-05-12 DIAGNOSIS — M109 Gout, unspecified: Secondary | ICD-10-CM | POA: Diagnosis not present

## 2020-05-12 DIAGNOSIS — I4891 Unspecified atrial fibrillation: Secondary | ICD-10-CM | POA: Diagnosis not present

## 2020-05-12 DIAGNOSIS — J1282 Pneumonia due to coronavirus disease 2019: Secondary | ICD-10-CM | POA: Diagnosis not present

## 2020-05-12 DIAGNOSIS — E1165 Type 2 diabetes mellitus with hyperglycemia: Secondary | ICD-10-CM | POA: Diagnosis not present

## 2020-05-12 NOTE — Telephone Encounter (Signed)
Left a detailed voice message for Marcus [name] at [facility] to let him know that a prescription for the test strips requested were sent electronically on 01/10/2020, quantity 100 with 3 refills. Pharmacy should have on file. Left message to confirm if they need something else.

## 2020-05-12 NOTE — Telephone Encounter (Signed)
Called patient and let him know that we did receive a fax for the strip request from [facility] and this has been completed and faxed back to their DME department at [phone]. Patient verbalized an understanding.

## 2020-05-13 DIAGNOSIS — I4891 Unspecified atrial fibrillation: Secondary | ICD-10-CM | POA: Diagnosis not present

## 2020-05-13 DIAGNOSIS — I10 Essential (primary) hypertension: Secondary | ICD-10-CM | POA: Diagnosis not present

## 2020-05-13 DIAGNOSIS — E1151 Type 2 diabetes mellitus with diabetic peripheral angiopathy without gangrene: Secondary | ICD-10-CM | POA: Diagnosis not present

## 2020-05-13 DIAGNOSIS — E1165 Type 2 diabetes mellitus with hyperglycemia: Secondary | ICD-10-CM | POA: Diagnosis not present

## 2020-05-13 DIAGNOSIS — J1282 Pneumonia due to coronavirus disease 2019: Secondary | ICD-10-CM | POA: Diagnosis not present

## 2020-05-13 DIAGNOSIS — U071 COVID-19: Secondary | ICD-10-CM | POA: Diagnosis not present

## 2020-05-14 DIAGNOSIS — U071 COVID-19: Secondary | ICD-10-CM | POA: Diagnosis not present

## 2020-05-14 DIAGNOSIS — I10 Essential (primary) hypertension: Secondary | ICD-10-CM | POA: Diagnosis not present

## 2020-05-14 DIAGNOSIS — E1165 Type 2 diabetes mellitus with hyperglycemia: Secondary | ICD-10-CM | POA: Diagnosis not present

## 2020-05-14 DIAGNOSIS — J1282 Pneumonia due to coronavirus disease 2019: Secondary | ICD-10-CM | POA: Diagnosis not present

## 2020-05-14 DIAGNOSIS — I4891 Unspecified atrial fibrillation: Secondary | ICD-10-CM | POA: Diagnosis not present

## 2020-05-14 DIAGNOSIS — E1151 Type 2 diabetes mellitus with diabetic peripheral angiopathy without gangrene: Secondary | ICD-10-CM | POA: Diagnosis not present

## 2020-05-15 ENCOUNTER — Ambulatory Visit (INDEPENDENT_AMBULATORY_CARE_PROVIDER_SITE_OTHER): Payer: Medicare Other | Admitting: Ophthalmology

## 2020-05-15 ENCOUNTER — Encounter (INDEPENDENT_AMBULATORY_CARE_PROVIDER_SITE_OTHER): Payer: Self-pay | Admitting: Ophthalmology

## 2020-05-15 ENCOUNTER — Other Ambulatory Visit: Payer: Self-pay

## 2020-05-15 DIAGNOSIS — H43812 Vitreous degeneration, left eye: Secondary | ICD-10-CM | POA: Diagnosis not present

## 2020-05-15 DIAGNOSIS — H25042 Posterior subcapsular polar age-related cataract, left eye: Secondary | ICD-10-CM

## 2020-05-15 DIAGNOSIS — E1151 Type 2 diabetes mellitus with diabetic peripheral angiopathy without gangrene: Secondary | ICD-10-CM | POA: Diagnosis not present

## 2020-05-15 DIAGNOSIS — H2512 Age-related nuclear cataract, left eye: Secondary | ICD-10-CM

## 2020-05-15 DIAGNOSIS — I10 Essential (primary) hypertension: Secondary | ICD-10-CM | POA: Diagnosis not present

## 2020-05-15 DIAGNOSIS — H43811 Vitreous degeneration, right eye: Secondary | ICD-10-CM | POA: Diagnosis not present

## 2020-05-15 DIAGNOSIS — E1165 Type 2 diabetes mellitus with hyperglycemia: Secondary | ICD-10-CM | POA: Diagnosis not present

## 2020-05-15 DIAGNOSIS — Z961 Presence of intraocular lens: Secondary | ICD-10-CM | POA: Diagnosis not present

## 2020-05-15 DIAGNOSIS — H2511 Age-related nuclear cataract, right eye: Secondary | ICD-10-CM

## 2020-05-15 DIAGNOSIS — I4891 Unspecified atrial fibrillation: Secondary | ICD-10-CM | POA: Diagnosis not present

## 2020-05-15 DIAGNOSIS — J1282 Pneumonia due to coronavirus disease 2019: Secondary | ICD-10-CM | POA: Diagnosis not present

## 2020-05-15 DIAGNOSIS — U071 COVID-19: Secondary | ICD-10-CM | POA: Diagnosis not present

## 2020-05-15 NOTE — Assessment & Plan Note (Signed)
The patient has diabetes without any evidence of retinopathy. The patient advised to maintain good blood glucose control, excellent blood pressure control, and favorable levels of cholesterol, low density lipoprotein, and high density lipoproteins. Follow up in 1 year was recommended. Explained that fluctuations in visual acuity , or "out of focus", may result from large variations of blood sugar control.

## 2020-05-15 NOTE — Patient Instructions (Signed)
Patient to report new onset visual acuity declines or distortions

## 2020-05-15 NOTE — Progress Notes (Signed)
05/15/2020     CHIEF COMPLAINT Patient presents for Retina Follow Up   HISTORY OF PRESENT ILLNESS: Marcus [name] is a 74 y.o. male who presents to the clinic today for:   HPI    Retina Follow Up    Patient presents with  PVD.  In both eyes.  Severity is moderate.  Duration of 1 year.  Since onset it is stable.  I, the attending physician,  performed the HPI with the patient and updated documentation appropriately.          Comments    1 Year PVD and Diabetic f\u OU. OCT  Pt states he has not had any changes in vision. Pt recently had Cat sx OU performed by Dr. Kathlen Mody . BGL: did not check A1C: unknown       Last edited by Tilda Franco on 05/15/2020  8:32 AM. (History)      Referring physician: Doree Albee, MD [place],  [place] [id number]  HISTORICAL INFORMATION:   Selected notes from the MEDICAL RECORD NUMBER    Lab Results  Component Value Date   HGBA1C 7.3 (H) 03/03/2020     CURRENT MEDICATIONS: No current outpatient medications on file. (Ophthalmic Drugs)   No current facility-administered medications for this visit. (Ophthalmic Drugs)   Current Outpatient Medications (Other)  Medication Sig  . albuterol (VENTOLIN HFA) 108 (90 Base) MCG/ACT inhaler Inhale 2 puffs into the lungs every 6 (six) hours as needed for wheezing or shortness of breath.  Marland Kitchen aspirin EC 81 MG tablet Take 1 tablet (81 mg total) by mouth daily.  Marland Kitchen glipiZIDE (GLUCOTROL) 5 MG tablet TAKE 1 TABLET ONCE DAILY.  Marland Kitchen glucose blood test strip Use to check your blood glucose once a day.  . metFORMIN (GLUCOPHAGE-XR) 500 MG 24 hr tablet TAKE (1) TABLET TWICE DAILY. (Patient taking differently: Take 500 mg by mouth 2 (two) times daily. )  . metoprolol tartrate 75 MG TABS Take 75 mg by mouth 2 (two) times daily.  . Multiple Vitamin (MULTIVITAMIN WITH MINERALS) TABS tablet Take 1 tablet by mouth daily.  . pantoprazole (PROTONIX) 40 MG tablet Take 1 tablet (40 mg total)  by mouth daily.  . polyethylene glycol (MIRALAX / GLYCOLAX) 17 g packet Take 17 g by mouth daily as needed for mild constipation.  . pravastatin (PRAVACHOL) 20 MG tablet Take 1 tablet (20 mg total) by mouth daily.  . vitamin B-12 (CYANOCOBALAMIN) 500 MCG tablet Take 1 tablet (500 mcg total) by mouth daily.   No current facility-administered medications for this visit. (Other)      REVIEW OF SYSTEMS: ROS    Positive for: Endocrine   Other comments for: Gastrointestinal (.)   Last edited by Tilda Franco on 05/15/2020  8:32 AM. (History)       ALLERGIES Allergies  Allergen Reactions  . Influenza Vaccines Other (See Comments)    Guillain-Barre syndrome    PAST MEDICAL HISTORY Past Medical History:  Diagnosis Date  . AF (atrial fibrillation) ([place])   . Atrial flutter ([place])   . Blue toes    chronic  . Essential hypertension, benign 04/23/2019  . GERD (gastroesophageal reflux disease)   . Gout   . Guillain Barr syndrome ([place])   . Nuclear sclerotic cataract of left eye 05/07/2020  . Nuclear sclerotic cataract of right eye 05/07/2020  . Posterior subcapsular polar age-related cataract of left eye 05/07/2020  . Type II diabetes mellitus, uncontrolled ([place]) 04/23/2019  Past Surgical History:  Procedure Laterality Date  . catheter ablation  12/23/2004  . COLONOSCOPY N/A 09/18/2014   Procedure: COLONOSCOPY;  Surgeon: Rogene Houston, MD;  [facility];  Service: Endoscopy;  Laterality: N/A;  830  . EYE SURGERY  2020   Bilateral cataract surgery  . FEMUR IM NAIL Right 06/25/2013   Procedure: INTRAMEDULLARY (IM) RETROGRADE FEMORAL NAILING;  Surgeon: Marybelle Killings, MD;  Location: [place] [facility];  Service: Orthopedics;  Laterality: Right;    FAMILY HISTORY Family History  Problem Relation Age of Onset  . Diabetes Father   . Colon cancer Father   . Colon cancer Mother     SOCIAL HISTORY Social History   Tobacco Use  . Smoking status: Former Smoker    Packs/day:  2.00    Years: 5.00    Pack years: 10.00    Types: Cigarettes    Start date: 11/23/2016    Quit date: 11/24/2016    Years since quitting: 3.4  . Smokeless tobacco: Never Used  Vaping Use  . Vaping Use: Never used  Substance Use Topics  . Alcohol use: No  . Drug use: No         OPHTHALMIC EXAM:  Base Eye Exam    Visual Acuity (Snellen - Linear)      Right Left   Dist [place] 20/40 20/20   Dist ph [place] 20/30        Tonometry (Tonopen, 8:36 AM)      Right Left   Pressure 14 18       Pupils      Pupils Dark Light Shape React APD   Right PERRL 4 3 Round Brisk None   Left PERRL 4 3 Round Brisk None       Visual Fields (Counting fingers)      Left Right    Full Full       Neuro/Psych    Oriented x3: Yes   Mood/Affect: Normal       Dilation    Both eyes: 1.0% Mydriacyl, 2.5% Phenylephrine @ 8:36 AM        Slit Lamp and Fundus Exam    External Exam      Right Left   External Normal Normal       Slit Lamp Exam      Right Left   Lids/Lashes Normal Normal   Conjunctiva/Sclera White and quiet White and quiet   Cornea Clear Clear   Anterior Chamber Deep and quiet Deep and quiet   Iris Round and reactive Round and reactive   Lens Centered posterior chamber intraocular lens Centered posterior chamber intraocular lens   Anterior Vitreous Normal Normal       Fundus Exam      Right Left   Posterior Vitreous Posterior vitreous detachment Posterior vitreous detachment   Disc Normal Normal   C/D Ratio 0.3 0.3   Macula Normal Normal   Vessels no DR Normal   Periphery Normal Normal          IMAGING AND PROCEDURES  Imaging and Procedures for 05/15/20  OCT, Retina - OU - Both Eyes       Right Eye Quality was good. Scan locations included subfoveal. Central Foveal Thickness: 295. Progression has been stable. Findings include normal foveal contour.   Left Eye Quality was good. Scan locations included subfoveal. Central Foveal Thickness: 269. Progression has been  stable. Findings include normal foveal contour.   Notes No active maculopathy OU, incidental posterior  vitreous detachment noted.                ASSESSMENT/PLAN:  Type II diabetes mellitus, uncontrolled ([place]) The patient has diabetes without any evidence of retinopathy. The patient advised to maintain good blood glucose control, excellent blood pressure control, and favorable levels of cholesterol, low density lipoprotein, and high density lipoproteins. Follow up in 1 year was recommended. Explained that fluctuations in visual acuity , or "out of focus", may result from large variations of blood sugar control.      ICD-10-CM   1. Uncontrolled type 2 diabetes mellitus with hyperglycemia (HCC)  E11.65   2. Posterior vitreous detachment of right eye  H43.811 OCT, Retina - OU - Both Eyes  3. Posterior vitreous detachment of left eye  H43.812 OCT, Retina - OU - Both Eyes  4. Pseudophakia of both eyes  Z96.1     1.  I reiterated to the patient the critical importance of good blood sugar control with a goal perhaps of a hemoglobin A1c in the low sevens initially.  2.  In the absence of diet tractable diabetic retinopathy, follow-up here in 2 years for dilated examination and with Dr. Marshall Cork as scheduled  3.  Ophthalmic Meds Ordered this visit:  No orders of the defined types were placed in this encounter.      Return in about 2 years (around 05/15/2022) for DILATE OU, OCT, COLOR FP.  Patient Instructions  Patient to report new onset visual acuity declines or distortions    Explained the diagnoses, plan, and follow up with the patient and they expressed understanding.  Patient expressed understanding of the importance of proper follow up care.   Clent Demark Delma Villalva M.D. Diseases & Surgery of the Retina and Vitreous Retina & Diabetic [place] 05/15/20     Abbreviations: M myopia (nearsighted); A astigmatism; H hyperopia (farsighted); P presbyopia; Mrx spectacle  prescription;  CTL contact lenses; OD right eye; OS left eye; OU both eyes  XT exotropia; ET esotropia; PEK punctate epithelial keratitis; PEE punctate epithelial erosions; DES dry eye syndrome; MGD meibomian gland dysfunction; ATs artificial tears; PFAT's preservative free artificial tears; [place] nuclear sclerotic cataract; PSC posterior subcapsular cataract; ERM epi-retinal membrane; PVD posterior vitreous detachment; RD retinal detachment; DM diabetes mellitus; DR diabetic retinopathy; NPDR non-proliferative diabetic retinopathy; PDR proliferative diabetic retinopathy; CSME clinically significant macular edema; DME diabetic macular edema; dbh dot blot hemorrhages; CWS cotton wool spot; POAG primary open angle glaucoma; C/D cup-to-disc ratio; HVF humphrey visual field; GVF goldmann visual field; OCT optical coherence tomography; IOP intraocular pressure; BRVO Branch retinal vein occlusion; CRVO central retinal vein occlusion; CRAO central retinal artery occlusion; BRAO branch retinal artery occlusion; RT retinal tear; SB scleral buckle; PPV pars plana vitrectomy; VH Vitreous hemorrhage; PRP panretinal laser photocoagulation; IVK intravitreal kenalog; VMT vitreomacular traction; MH Macular hole;  NVD neovascularization of the disc; NVE neovascularization elsewhere; AREDS age related eye disease study; ARMD age related macular degeneration; POAG primary open angle glaucoma; EBMD epithelial/anterior basement membrane dystrophy; ACIOL anterior chamber intraocular lens; IOL intraocular lens; PCIOL posterior chamber intraocular lens; Phaco/IOL phacoemulsification with intraocular lens placement; [place] photorefractive keratectomy; LASIK laser assisted in situ keratomileusis; HTN hypertension; DM diabetes mellitus; COPD chronic obstructive pulmonary disease

## 2020-05-20 DIAGNOSIS — U071 COVID-19: Secondary | ICD-10-CM | POA: Diagnosis not present

## 2020-05-20 DIAGNOSIS — M109 Gout, unspecified: Secondary | ICD-10-CM | POA: Diagnosis not present

## 2020-05-20 DIAGNOSIS — E1151 Type 2 diabetes mellitus with diabetic peripheral angiopathy without gangrene: Secondary | ICD-10-CM | POA: Diagnosis not present

## 2020-05-20 DIAGNOSIS — Z87891 Personal history of nicotine dependence: Secondary | ICD-10-CM | POA: Diagnosis not present

## 2020-05-20 DIAGNOSIS — Z7984 Long term (current) use of oral hypoglycemic drugs: Secondary | ICD-10-CM | POA: Diagnosis not present

## 2020-05-20 DIAGNOSIS — R630 Anorexia: Secondary | ICD-10-CM | POA: Diagnosis not present

## 2020-05-20 DIAGNOSIS — I10 Essential (primary) hypertension: Secondary | ICD-10-CM | POA: Diagnosis not present

## 2020-05-20 DIAGNOSIS — K219 Gastro-esophageal reflux disease without esophagitis: Secondary | ICD-10-CM | POA: Diagnosis not present

## 2020-05-20 DIAGNOSIS — J1282 Pneumonia due to coronavirus disease 2019: Secondary | ICD-10-CM | POA: Diagnosis not present

## 2020-05-20 DIAGNOSIS — E1165 Type 2 diabetes mellitus with hyperglycemia: Secondary | ICD-10-CM | POA: Diagnosis not present

## 2020-05-20 DIAGNOSIS — I4892 Unspecified atrial flutter: Secondary | ICD-10-CM | POA: Diagnosis not present

## 2020-05-20 DIAGNOSIS — Z9181 History of falling: Secondary | ICD-10-CM | POA: Diagnosis not present

## 2020-05-20 DIAGNOSIS — I4891 Unspecified atrial fibrillation: Secondary | ICD-10-CM | POA: Diagnosis not present

## 2020-05-20 DIAGNOSIS — G610 Guillain-Barre syndrome: Secondary | ICD-10-CM | POA: Diagnosis not present

## 2020-05-21 DIAGNOSIS — U071 COVID-19: Secondary | ICD-10-CM | POA: Diagnosis not present

## 2020-05-21 DIAGNOSIS — E1151 Type 2 diabetes mellitus with diabetic peripheral angiopathy without gangrene: Secondary | ICD-10-CM | POA: Diagnosis not present

## 2020-05-21 DIAGNOSIS — I4891 Unspecified atrial fibrillation: Secondary | ICD-10-CM | POA: Diagnosis not present

## 2020-05-21 DIAGNOSIS — J1282 Pneumonia due to coronavirus disease 2019: Secondary | ICD-10-CM | POA: Diagnosis not present

## 2020-05-21 DIAGNOSIS — I10 Essential (primary) hypertension: Secondary | ICD-10-CM | POA: Diagnosis not present

## 2020-05-21 DIAGNOSIS — E1165 Type 2 diabetes mellitus with hyperglycemia: Secondary | ICD-10-CM | POA: Diagnosis not present

## 2020-05-23 ENCOUNTER — Ambulatory Visit (INDEPENDENT_AMBULATORY_CARE_PROVIDER_SITE_OTHER): Payer: Medicare Other | Admitting: Orthotics

## 2020-05-23 ENCOUNTER — Other Ambulatory Visit (INDEPENDENT_AMBULATORY_CARE_PROVIDER_SITE_OTHER): Payer: Self-pay | Admitting: Nurse Practitioner

## 2020-05-23 ENCOUNTER — Other Ambulatory Visit: Payer: Self-pay

## 2020-05-23 DIAGNOSIS — E1151 Type 2 diabetes mellitus with diabetic peripheral angiopathy without gangrene: Secondary | ICD-10-CM

## 2020-05-23 DIAGNOSIS — E1159 Type 2 diabetes mellitus with other circulatory complications: Secondary | ICD-10-CM | POA: Diagnosis not present

## 2020-05-23 DIAGNOSIS — M216X2 Other acquired deformities of left foot: Secondary | ICD-10-CM | POA: Diagnosis not present

## 2020-05-23 DIAGNOSIS — L84 Corns and callosities: Secondary | ICD-10-CM | POA: Diagnosis not present

## 2020-05-23 DIAGNOSIS — M216X1 Other acquired deformities of right foot: Secondary | ICD-10-CM | POA: Diagnosis not present

## 2020-05-23 DIAGNOSIS — E1140 Type 2 diabetes mellitus with diabetic neuropathy, unspecified: Secondary | ICD-10-CM | POA: Diagnosis not present

## 2020-05-27 ENCOUNTER — Ambulatory Visit (INDEPENDENT_AMBULATORY_CARE_PROVIDER_SITE_OTHER): Payer: Medicare Other | Admitting: Nurse Practitioner

## 2020-05-27 ENCOUNTER — Encounter (INDEPENDENT_AMBULATORY_CARE_PROVIDER_SITE_OTHER): Payer: Self-pay | Admitting: Nurse Practitioner

## 2020-05-27 ENCOUNTER — Other Ambulatory Visit: Payer: Self-pay

## 2020-05-27 VITALS — BP 122/78 | HR 53 | Temp 96.9°F | Ht 71.0 in | Wt 179.2 lb

## 2020-05-27 DIAGNOSIS — Z0000 Encounter for general adult medical examination without abnormal findings: Secondary | ICD-10-CM

## 2020-05-27 DIAGNOSIS — U071 COVID-19: Secondary | ICD-10-CM | POA: Diagnosis not present

## 2020-05-27 DIAGNOSIS — E1151 Type 2 diabetes mellitus with diabetic peripheral angiopathy without gangrene: Secondary | ICD-10-CM | POA: Diagnosis not present

## 2020-05-27 DIAGNOSIS — J1282 Pneumonia due to coronavirus disease 2019: Secondary | ICD-10-CM | POA: Diagnosis not present

## 2020-05-27 DIAGNOSIS — E1165 Type 2 diabetes mellitus with hyperglycemia: Secondary | ICD-10-CM | POA: Diagnosis not present

## 2020-05-27 DIAGNOSIS — I4891 Unspecified atrial fibrillation: Secondary | ICD-10-CM | POA: Diagnosis not present

## 2020-05-27 DIAGNOSIS — I10 Essential (primary) hypertension: Secondary | ICD-10-CM | POA: Diagnosis not present

## 2020-05-27 NOTE — Progress Notes (Signed)
Subjective:   Marcus [name] is a 74 y.o. male who presents for Medicare Annual/Subsequent preventive examination.             Objective:    Today's Vitals   05/27/20 1435  BP: 122/78  Pulse: (!) 53  Temp: (!) 96.9 F (36.1 C)  TempSrc: Temporal  SpO2: 94%  Weight: 179 lb 3.2 oz (81.3 kg)  Height: 5' 11"  (1.803 m)   Body mass index is 24.99 kg/m.  Advanced Directives 05/27/2020 03/14/2020 03/04/2020 01/19/2019 12/28/2017 11/09/2017 11/09/2017  Does Patient Have a Medical Advance Directive? Yes No Yes Yes Yes Yes No  Type of Advance Directive Living will - Living will;Healthcare Power of [place];Living will [place];Living will Living will;Healthcare Power of Attorney -  Does patient want to make changes to medical advance directive? No - Patient declined No - Patient declined No - Patient declined - - No - Patient declined -  Copy of [place] in Chart? - - - - - No - copy requested -  Would patient like information on creating a medical advance directive? No - Patient declined No - Patient declined - - - No - Patient declined No - Patient declined  Pre-existing out of facility DNR order (yellow form or pink MOST form) - - - - - - -    Current Medications (verified) Outpatient Encounter Medications as of 05/27/2020  Medication Sig  . albuterol (VENTOLIN HFA) 108 (90 Base) MCG/ACT inhaler Inhale 2 puffs into the lungs every 6 (six) hours as needed for wheezing or shortness of breath.  Marland Kitchen aspirin EC 81 MG tablet Take 1 tablet (81 mg total) by mouth daily.  Marland Kitchen glipiZIDE (GLUCOTROL) 5 MG tablet TAKE 1 TABLET ONCE DAILY.  Marland Kitchen glucose blood test strip Use to check your blood glucose once a day.  . metFORMIN (GLUCOPHAGE-XR) 500 MG 24 hr tablet TAKE (1) TABLET TWICE DAILY. (Patient taking differently: Take 500 mg by mouth 2 (two) times daily. )  . metoprolol tartrate 75 MG TABS Take 75 mg by mouth 2 (two) times  daily.  . Multiple Vitamin (MULTIVITAMIN WITH MINERALS) TABS tablet Take 1 tablet by mouth daily.  . pantoprazole (PROTONIX) 40 MG tablet Take 1 tablet (40 mg total) by mouth daily.  . polyethylene glycol (MIRALAX / GLYCOLAX) 17 g packet Take 17 g by mouth daily as needed for mild constipation.  . pravastatin (PRAVACHOL) 20 MG tablet Take 1 tablet (20 mg total) by mouth daily.  . vitamin B-12 (CYANOCOBALAMIN) 500 MCG tablet Take 1 tablet (500 mcg total) by mouth daily.  . [DISCONTINUED] glipiZIDE (GLUCOTROL) 5 MG tablet Take 5 mg by mouth daily before breakfast.  . [DISCONTINUED] glipiZIDE (GLUCOTROL) 5 MG tablet TAKE 1 TABLET ONCE DAILY.  . [DISCONTINUED] glipiZIDE (GLUCOTROL) 5 MG tablet TAKE 1 TABLET DAILY.  . [DISCONTINUED] metFORMIN (GLUCOPHAGE XR) 500 MG 24 hr tablet Take 1 tablet (500 mg total) by mouth 2 (two) times a day.  . [DISCONTINUED] metFORMIN (GLUCOPHAGE-XR) 500 MG 24 hr tablet TAKE (1) TABLET TWICE DAILY.   No facility-administered encounter medications on file as of 05/27/2020.    Allergies (verified) Influenza vaccines   History: Past Medical History:  Diagnosis Date  . AF (atrial fibrillation) ([place])   . Atrial flutter ([place])   . Blue toes    chronic  . Essential hypertension, benign 04/23/2019  . GERD (gastroesophageal reflux disease)   . Gout   . Guillain  Barr syndrome ([place])   . Nuclear sclerotic cataract of left eye 05/07/2020  . Nuclear sclerotic cataract of right eye 05/07/2020  . Posterior subcapsular polar age-related cataract of left eye 05/07/2020  . Type II diabetes mellitus, uncontrolled ([place]) 04/23/2019   Past Surgical History:  Procedure Laterality Date  . catheter ablation  12/23/2004  . COLONOSCOPY N/A 09/18/2014   Procedure: COLONOSCOPY;  Surgeon: Rogene Houston, MD;  [facility];  Service: Endoscopy;  Laterality: N/A;  830  . EYE SURGERY  2020   Bilateral cataract surgery  . FEMUR IM NAIL Right 06/25/2013   Procedure:  INTRAMEDULLARY (IM) RETROGRADE FEMORAL NAILING;  Surgeon: Marybelle Killings, MD;  Location: [place];  Service: Orthopedics;  Laterality: Right;   Family History  Problem Relation Age of Onset  . Diabetes Father   . Colon cancer Father   . Colon cancer Mother    Social History   Socioeconomic History  . Marital status: Married    Spouse name: Not on file  . Number of children: Not on file  . Years of education: Not on file  . Highest education level: Not on file  Occupational History  . Not on file  Tobacco Use  . Smoking status: Former Smoker    Packs/day: 2.00    Years: 5.00    Pack years: 10.00    Types: Cigarettes    Start date: 11/23/2016    Quit date: 11/24/2016    Years since quitting: 3.5  . Smokeless tobacco: Never Used  Vaping Use  . Vaping Use: Never used  Substance and Sexual Activity  . Alcohol use: No  . Drug use: No  . Sexual activity: Not on file  Other Topics Concern  . Not on file  Social History Narrative   Married for 64 years,lives with wife.Retired.   Social Determinants of Health   Financial Resource Strain:   . Difficulty of Paying Living Expenses: Not on file  Food Insecurity:   . Worried About Charity fundraiser in the Last Year: Not on file  . Ran Out of Food in the Last Year: Not on file  Transportation Needs:   . Lack of Transportation (Medical): Not on file  . Lack of Transportation (Non-Medical): Not on file  Physical Activity:   . Days of Exercise per Week: Not on file  . Minutes of Exercise per Session: Not on file  Stress:   . Feeling of Stress : Not on file  Social Connections:   . Frequency of Communication with Friends and Family: Not on file  . Frequency of Social Gatherings with Friends and Family: Not on file  . Attends Religious Services: Not on file  . Active Member of Clubs or Organizations: Not on file  . Attends Archivist Meetings: Not on file  . Marital Status: Not on file    Tobacco Counseling Counseling  given: Yes   Clinical Intake:  Pre-visit preparation completed: Yes  Pain : No/denies pain     BMI - recorded: 24.99 Nutritional Status: BMI of 19-24  Normal Nutritional Risks: None Diabetes: Yes CBG done?: Yes CBG resulted in Enter/ Edit results?: No (112) Did pt. bring in CBG monitor from home?: No  How often do you need to have someone help you when you read instructions, pamphlets, or other written materials from your doctor or pharmacy?: 3 - Sometimes What is the last grade level you completed in school?: 12th grade  Diabetic? yes  Interpreter  Needed?: No  Information entered by :: Jeralyn Ruths, NP-C   Activities of Daily Living In your present state of health, do you have any difficulty performing the following activities: 03/14/2020 03/14/2020  Hearing? N N  Vision? N N  Difficulty concentrating or making decisions? N N  Walking or climbing stairs? N N  Dressing or bathing? Y Y  Doing errands, shopping? Y -  Some recent data might be hidden    Patient Care Team: Doree Albee, MD as PCP - General (Internal Medicine) Evans Lance, MD as PCP - Cardiology (Cardiology)  Indicate any recent Medical Services you may have received from other than [facility] providers in the past year (date may be approximate).     Assessment:   This is a routine wellness examination for Arav.  Hearing/Vision screen No exam data present  Dietary issues and exercise activities discussed:    Goals    . Blood Pressure < 140/90     At goal      Depression Screen PHQ 2/9 Scores 05/27/2020 08/01/2019  PHQ - 2 Score 0 0  PHQ- 9 Score 0 -    Fall Risk Fall Risk  05/27/2020 12/20/2019 08/01/2019  Falls in the past year? 1 0 0  Number falls in past yr: 1 0 0  Injury with Fall? 0 0 0    Any stairs in or around the home? Yes  If so, are there any without handrails? No  Home free of loose throw rugs in walkways, pet beds, electrical cords, etc? Yes  Adequate lighting in your home  to reduce risk of falls? Yes   ASSISTIVE DEVICES UTILIZED TO PREVENT FALLS:  Life alert? No  Use of a cane, walker or w/c? Yes  Grab bars in the bathroom? Yes  Shower chair or bench in shower? Yes  Elevated toilet seat or a handicapped toilet? Yes   TIMED UP AND GO:  Was the test performed? Yes .  Length of time to ambulate 10 feet: 8 sec.   Gait steady and fast without use of assistive device  Cognitive Function:     6CIT Screen 05/27/2020  What Year? 0 points  What month? 0 points  What time? 0 points  Count back from 20 0 points  Months in reverse 0 points  Repeat phrase 4 points  Total Score 4    Immunizations Immunization History  Administered Date(s) Administered  . Influenza,inj,Quad PF,6+ Mos 06/26/2013  . Tdap 06/25/2013    TDAP status: Up to date Flu Vaccine status: Declined, Education has been provided regarding the importance of this vaccine but patient still declined. Advised may receive this vaccine at local pharmacy or Health Dept. Aware to provide a copy of the vaccination record if obtained from local pharmacy or Health Dept. Verbalized acceptance and understanding. Pneumococcal vaccine status: Declined,  Education has been provided regarding the importance of this vaccine but patient still declined. Advised may receive this vaccine at local pharmacy or Health Dept. Aware to provide a copy of the vaccination record if obtained from local pharmacy or Health Dept. Verbalized acceptance and understanding.  Covid-19 vaccine status: Declined, Education has been provided regarding the importance of this vaccine but patient still declined. Advised may receive this vaccine at local pharmacy or Health Dept.or vaccine clinic. Aware to provide a copy of the vaccination record if obtained from local pharmacy or Health Dept. Verbalized acceptance and understanding.   *Will not take vaccines due to history of Guillian Barre syndrome  thought to be related to the flu  shot*  Qualifies for Shingles Vaccine? No   Zostavax completed No   Shingrix Completed?: No.    Education has been provided regarding the importance of this vaccine. Patient has been advised to call insurance company to determine out of pocket expense if they have not yet received this vaccine. Advised may also receive vaccine at local pharmacy or Health Dept. Verbalized acceptance and understanding.  Screening Tests Health Maintenance  Topic Date Due  . FOOT EXAM  Never done  . COVID-19 Vaccine (1) Never done  . PNA vac Low Risk Adult (2 of 2 - PCV13) 04/22/2021 (Originally 11/18/2010)  . URINE MICROALBUMIN  08/06/2020  . HEMOGLOBIN A1C  09/03/2020  . OPHTHALMOLOGY EXAM  05/15/2021  . TETANUS/TDAP  06/26/2023  . Hepatitis C Screening  Completed  . INFLUENZA VACCINE  Discontinued    Health Maintenance  Health Maintenance Due  Topic Date Due  . FOOT EXAM  Never done  . COVID-19 Vaccine (1) Never done    Colorectal cancer screening: Completed 2017. Repeat every 5 years  Lung Cancer Screening: (Low Dose CT Chest recommended if Age 20-80 years, 30 pack-year currently smoking OR have quit w/in 15years.) does not qualify.   Lung Cancer Screening Referral: N/A  Additional Screening:  Hepatitis C Screening: does not qualify; Completed 03/2019  Vision Screening: Recommended annual ophthalmology exams for early detection of glaucoma and other disorders of the eye. Is the patient up to date with their annual eye exam?  Yes  Who is the provider or what is the name of the office in which the patient attends annual eye exams? Dr. Mariana Arn In [place] If pt is not established with a provider, would they like to be referred to a provider to establish care? No .   Dental Screening: Recommended annual dental exams for proper oral hygiene  Community Resource Referral / Chronic Care Management: CRR required this visit?  No   CCM required this visit?  No      Plan:     I have  personally reviewed and noted the following in the patient's chart:   . Medical and social history . Use of alcohol, tobacco or illicit drugs  . Current medications and supplements . Functional ability and status . Nutritional status . Physical activity . Advanced directives . List of other physicians . Hospitalizations, surgeries, and ER visits in previous 12 months . Vitals . Screenings to include cognitive, depression, and falls . Referrals and appointments  In addition, I have reviewed and discussed with patient certain preventive protocols, quality metrics, and best practice recommendations. A written personalized care plan for preventive services as well as general preventive health recommendations were provided to patient.     Ailene Ards, NP   05/27/2020

## 2020-05-27 NOTE — Patient Instructions (Signed)
  Mr. Moravek , Thank you for taking time to come for your Medicare Wellness Visit. I appreciate your ongoing commitment to your health goals. Please review the following plan we discussed and let me know if I can assist you in the future.   These are the goals we discussed: Goals    . Blood Pressure < 140/90     At goal       This is a list of the screening recommended for you and due dates:  Health Maintenance  Topic Date Due  . Complete foot exam   Never done  . COVID-19 Vaccine (1) Never done  . Pneumonia vaccines (2 of 2 - PCV13) 04/22/2021*  . Urine Protein Check  08/06/2020  . Hemoglobin A1C  09/03/2020  . Eye exam for diabetics  05/15/2021  . Tetanus Vaccine  06/26/2023  .  Hepatitis C: One time screening is recommended by Center for Disease Control  (CDC) for  adults born from [birth year] through 1967.   Completed  . Flu Shot  Discontinued  *Topic was postponed. The date shown is not the original due date.

## 2020-05-29 ENCOUNTER — Other Ambulatory Visit (INDEPENDENT_AMBULATORY_CARE_PROVIDER_SITE_OTHER): Payer: Self-pay | Admitting: Internal Medicine

## 2020-06-02 ENCOUNTER — Ambulatory Visit (INDEPENDENT_AMBULATORY_CARE_PROVIDER_SITE_OTHER): Payer: Medicare Other | Admitting: Podiatry

## 2020-06-02 ENCOUNTER — Other Ambulatory Visit: Payer: Self-pay

## 2020-06-02 ENCOUNTER — Encounter: Payer: Self-pay | Admitting: Podiatry

## 2020-06-02 DIAGNOSIS — M79675 Pain in left toe(s): Secondary | ICD-10-CM | POA: Diagnosis not present

## 2020-06-02 DIAGNOSIS — B351 Tinea unguium: Secondary | ICD-10-CM | POA: Diagnosis not present

## 2020-06-02 DIAGNOSIS — E1151 Type 2 diabetes mellitus with diabetic peripheral angiopathy without gangrene: Secondary | ICD-10-CM

## 2020-06-02 DIAGNOSIS — S91105A Unspecified open wound of left lesser toe(s) without damage to nail, initial encounter: Secondary | ICD-10-CM | POA: Diagnosis not present

## 2020-06-02 DIAGNOSIS — M79674 Pain in right toe(s): Secondary | ICD-10-CM

## 2020-06-02 DIAGNOSIS — L84 Corns and callosities: Secondary | ICD-10-CM

## 2020-06-02 DIAGNOSIS — G610 Guillain-Barre syndrome: Secondary | ICD-10-CM | POA: Diagnosis not present

## 2020-06-03 DIAGNOSIS — D239 Other benign neoplasm of skin, unspecified: Secondary | ICD-10-CM | POA: Diagnosis not present

## 2020-06-03 DIAGNOSIS — Z85828 Personal history of other malignant neoplasm of skin: Secondary | ICD-10-CM | POA: Diagnosis not present

## 2020-06-03 DIAGNOSIS — L570 Actinic keratosis: Secondary | ICD-10-CM | POA: Diagnosis not present

## 2020-06-04 DIAGNOSIS — E1165 Type 2 diabetes mellitus with hyperglycemia: Secondary | ICD-10-CM | POA: Diagnosis not present

## 2020-06-04 DIAGNOSIS — E1151 Type 2 diabetes mellitus with diabetic peripheral angiopathy without gangrene: Secondary | ICD-10-CM | POA: Diagnosis not present

## 2020-06-04 DIAGNOSIS — I4891 Unspecified atrial fibrillation: Secondary | ICD-10-CM | POA: Diagnosis not present

## 2020-06-04 DIAGNOSIS — J1282 Pneumonia due to coronavirus disease 2019: Secondary | ICD-10-CM | POA: Diagnosis not present

## 2020-06-04 DIAGNOSIS — U071 COVID-19: Secondary | ICD-10-CM | POA: Diagnosis not present

## 2020-06-04 DIAGNOSIS — I10 Essential (primary) hypertension: Secondary | ICD-10-CM | POA: Diagnosis not present

## 2020-06-05 NOTE — Progress Notes (Signed)
Subjective: Marcus [name] presents today at risk foot care. Pt has h/o NIDDM with PAD and painful callus(es) b/l feet and painful mycotic toenails b/l that are difficult to trim. Pain interferes with ambulation. Aggravating factors include wearing enclosed shoe gear. Pain is relieved with periodic professional debridement.   He has small skin tear on the left 3rd digit and states he tried to peel a small piece of skin from it.   Pt also has h/o GBS. He voices no new pedal problems on today's visit.  Doree Albee, MD is patient's PCP.  Last visit was 04/30/2020.  Past Medical History:  Diagnosis Date  . AF (atrial fibrillation) ([place])   . Atrial flutter ([place])   . Blue toes    chronic  . Essential hypertension, benign 04/23/2019  . GERD (gastroesophageal reflux disease)   . Gout   . Guillain Barr syndrome ([place])   . Nuclear sclerotic cataract of left eye 05/07/2020  . Nuclear sclerotic cataract of right eye 05/07/2020  . Posterior subcapsular polar age-related cataract of left eye 05/07/2020  . Type II diabetes mellitus, uncontrolled ([place]) 04/23/2019     Current Outpatient Medications on File Prior to Visit  Medication Sig Dispense Refill  . albuterol (VENTOLIN HFA) 108 (90 Base) MCG/ACT inhaler Inhale 2 puffs into the lungs every 6 (six) hours as needed for wheezing or shortness of breath. 8 g 2  . aspirin EC 81 MG tablet Take 1 tablet (81 mg total) by mouth daily. 90 tablet 3  . glipiZIDE (GLUCOTROL) 5 MG tablet TAKE 1 TABLET ONCE DAILY. 30 tablet 2  . glucose blood test strip Use to check your blood glucose once a day. 100 each 3  . metFORMIN (GLUCOPHAGE-XR) 500 MG 24 hr tablet TAKE (1) TABLET TWICE DAILY. 60 tablet 3  . metoprolol tartrate 75 MG TABS Take 75 mg by mouth 2 (two) times daily. 60 tablet 1  . Multiple Vitamin (MULTIVITAMIN WITH MINERALS) TABS tablet Take 1 tablet by mouth daily. 90 tablet 2  . pantoprazole (PROTONIX) 40 MG tablet Take 1 tablet (40 mg  total) by mouth daily. 90 tablet 0  . polyethylene glycol (MIRALAX / GLYCOLAX) 17 g packet Take 17 g by mouth daily as needed for mild constipation. 14 each 0  . pravastatin (PRAVACHOL) 20 MG tablet Take 1 tablet (20 mg total) by mouth daily. 90 tablet 3  . vitamin B-12 (CYANOCOBALAMIN) 500 MCG tablet Take 1 tablet (500 mcg total) by mouth daily. 30 tablet 1  . [DISCONTINUED] glipiZIDE (GLUCOTROL) 5 MG tablet Take 5 mg by mouth daily before breakfast.    . [DISCONTINUED] glipiZIDE (GLUCOTROL) 5 MG tablet TAKE 1 TABLET ONCE DAILY. 30 tablet 3  . [DISCONTINUED] glipiZIDE (GLUCOTROL) 5 MG tablet TAKE 1 TABLET DAILY. 30 tablet 3  . [DISCONTINUED] metFORMIN (GLUCOPHAGE XR) 500 MG 24 hr tablet Take 1 tablet (500 mg total) by mouth 2 (two) times a day. 60 tablet 1  . [DISCONTINUED] metFORMIN (GLUCOPHAGE-XR) 500 MG 24 hr tablet TAKE (1) TABLET TWICE DAILY. 60 tablet 1   No current facility-administered medications on file prior to visit.     Allergies  Allergen Reactions  . Influenza Vaccines Other (See Comments)    Guillain-Barre syndrome    Objective: Marcus [name] is a pleasant 74 y.o. y.o. Patient Race: White or Caucasian [1]  male in NAD. AAO x 3.  There were no vitals filed for this visit.  Vascular Examination: Neurovascular status unchanged b/l lower  extremities. Capillary refill time to digits <4 seconds b/l. Nonpalpable DP pulse(s) right foot. Nonpalpable PT pulse(s) left foot. DP pulse palpable left foot. PT pulse palpable right foot. Pedal hair absent b/l. Skin temperature gradient warm to cool b/l. Cyanotic appearance of digits 1-5 when feet are in dependent position. Pallor noted with LE elevation b/l. No pain with calf compression b/l. +1 pitting edema b/l lower extremities. Evidence of chronic venous insufficiency b/l LE.  Dermatological Examination: Pedal skin is thin shiny, atrophic b/l lower extremities. No open wounds bilaterally. No interdigital macerations  bilaterally. Toenails 1-5 b/l elongated, discolored, dystrophic, thickened, crumbly with subungual debris and tenderness to dorsal palpation. Small skin tear noted on left 3rd digit. No erythema, no edema, no drainage, no fluctuance. Hyperkeratotic lesion(s) submet head 1 left foot, submet head 1 right foot and submet head 5 right foot.  No erythema, no edema, no drainage, no fluctuance.  Musculoskeletal: Normal muscle strength 5/5 to all lower extremity muscle groups bilaterally. No pain crepitus or joint limitation noted with ROM b/l. Hallux valgus with bunion deformity noted b/l lower extremities. Hammertoes noted to the L 5th toe and R 5th toe.  Neurological Examination: Protective sensation intact 5/5 intact bilaterally with 10g monofilament b/l. Vibratory sensation intact b/l. Proprioception intact bilaterally.  Assessment: 1. Pain due to onychomycosis of toenails of both feet   2. Callus   3. Open wound of third toe, left, initial encounter   4. GBS (Guillain Barre syndrome) ([place])   5. Type II diabetes mellitus with peripheral circulatory disorder (HCC)     Plan: -Examined patient. -No new findings. No new orders. -Continue diabetic foot care principles. -For healing wound left 3rd toe, patient instructed to apply triple antibiotic ointment to digit once daily for 2 weeks. -Toenails 1-5 b/l were debrided in length and girth with sterile nail nippers and dremel without iatrogenic bleeding.  -Callus(es) submet head 1 right foot and submet head 5 right foot pared utilizing sterile scalpel blade without complication or incident. Total number debrided =2. -Patient to report any pedal injuries to medical professional immediately.  Return in about 3 months (around 09/02/2020) for diabetic toenails, corn(s)/callus(es).  Marzetta Board, DPM

## 2020-06-09 DIAGNOSIS — E1165 Type 2 diabetes mellitus with hyperglycemia: Secondary | ICD-10-CM | POA: Diagnosis not present

## 2020-06-09 DIAGNOSIS — I10 Essential (primary) hypertension: Secondary | ICD-10-CM | POA: Diagnosis not present

## 2020-06-09 DIAGNOSIS — J1282 Pneumonia due to coronavirus disease 2019: Secondary | ICD-10-CM | POA: Diagnosis not present

## 2020-06-09 DIAGNOSIS — U071 COVID-19: Secondary | ICD-10-CM | POA: Diagnosis not present

## 2020-06-09 DIAGNOSIS — E1151 Type 2 diabetes mellitus with diabetic peripheral angiopathy without gangrene: Secondary | ICD-10-CM | POA: Diagnosis not present

## 2020-06-09 DIAGNOSIS — I4891 Unspecified atrial fibrillation: Secondary | ICD-10-CM | POA: Diagnosis not present

## 2020-06-17 ENCOUNTER — Other Ambulatory Visit: Payer: Self-pay | Admitting: Internal Medicine

## 2020-06-18 ENCOUNTER — Other Ambulatory Visit: Payer: Self-pay | Admitting: Internal Medicine

## 2020-06-18 DIAGNOSIS — I4891 Unspecified atrial fibrillation: Secondary | ICD-10-CM | POA: Diagnosis not present

## 2020-06-18 DIAGNOSIS — J1282 Pneumonia due to coronavirus disease 2019: Secondary | ICD-10-CM | POA: Diagnosis not present

## 2020-06-18 DIAGNOSIS — E1165 Type 2 diabetes mellitus with hyperglycemia: Secondary | ICD-10-CM | POA: Diagnosis not present

## 2020-06-18 DIAGNOSIS — U071 COVID-19: Secondary | ICD-10-CM | POA: Diagnosis not present

## 2020-06-18 DIAGNOSIS — I10 Essential (primary) hypertension: Secondary | ICD-10-CM | POA: Diagnosis not present

## 2020-06-18 DIAGNOSIS — E1151 Type 2 diabetes mellitus with diabetic peripheral angiopathy without gangrene: Secondary | ICD-10-CM | POA: Diagnosis not present

## 2020-06-18 MED ORDER — METOPROLOL TARTRATE 75 MG PO TABS: 75.0000 mg | ORAL_TABLET | Freq: Two times a day (BID) | ORAL | 1 refills | Status: DC

## 2020-06-18 NOTE — Telephone Encounter (Signed)
Refilled lopressor 75 mg bid #60 to [facility]

## 2020-06-18 NOTE — Telephone Encounter (Signed)
This is a [place] pt.  °

## 2020-06-18 NOTE — Telephone Encounter (Signed)
*  STAT* If patient is at the pharmacy, call can be transferred to refill team.   1. Which medications need to be refilled? (please list name of each medication and dose if known)   metoprolol tartrate 75 MG TABS    2. Which pharmacy/location (including street and city if local pharmacy) is medication to be sent to? [place] [place]  3. Do they need a 30 day or 90 day supply? 30 day supply  Patient is scheduled for 06/27/20 at 8:30 AM with Dr. Lovena Le.

## 2020-06-27 ENCOUNTER — Other Ambulatory Visit: Payer: Self-pay

## 2020-06-27 ENCOUNTER — Ambulatory Visit (INDEPENDENT_AMBULATORY_CARE_PROVIDER_SITE_OTHER): Payer: Medicare Other | Admitting: Internal Medicine

## 2020-06-27 ENCOUNTER — Encounter: Payer: Self-pay | Admitting: Internal Medicine

## 2020-06-27 VITALS — BP 122/82 | HR 71 | Ht 72.0 in | Wt 173.0 lb

## 2020-06-27 DIAGNOSIS — I4819 Other persistent atrial fibrillation: Secondary | ICD-10-CM | POA: Diagnosis not present

## 2020-06-27 NOTE — Patient Instructions (Addendum)
Medication Instructions:  Your physician recommends that you continue on your current medications as directed. Please refer to the Current Medication list given to you today.  *If you need a refill on your cardiac medications before your next appointment, please call your pharmacy*   Lab Work: NONE   If you have labs (blood work) drawn today and your tests are completely normal, you will receive your results only by: . MyChart Message (if you have MyChart) OR . A paper copy in the mail If you have any lab test that is abnormal or we need to change your treatment, we will call you to review the results.   Testing/Procedures: NONE    Follow-Up: At [facility], you and your health needs are our priority.  As part of our continuing mission to provide you with exceptional heart care, we have created designated Provider Care Teams.  These Care Teams include your primary Cardiologist (physician) and Advanced Practice Providers (APPs -  Physician Assistants and Nurse Practitioners) who all work together to provide you with the care you need, when you need it.  We recommend signing up for the patient portal called "MyChart".  Sign up information is provided on this After Visit Summary.  MyChart is used to connect with patients for Virtual Visits (Telemedicine).  Patients are able to view lab/test results, encounter notes, upcoming appointments, etc.  Non-urgent messages can be sent to your provider as well.   To learn more about what you can do with MyChart, go to [email or web address].    Your next appointment:   1 year(s)  The format for your next appointment:   In Person  Provider:   Gregg Taylor, MD   Other Instructions Thank you for choosing [facility]!    

## 2020-06-27 NOTE — Progress Notes (Signed)
HPI Marcus [name] returns today for followup. He is a pleasant 74 yo man with a h/o HTN, chronic atrial fib, DM, poorly controlled, who had a Covid infection a few months ago. He was in the hospital. He is slowly getting stronger. He still refuses systemic anti-coagulation although I have encouraged him to reconsider. Allergies  Allergen Reactions  . Influenza Vaccines Other (See Comments)    Guillain-Barre syndrome     Current Outpatient Medications  Medication Sig Dispense Refill  . albuterol (VENTOLIN HFA) 108 (90 Base) MCG/ACT inhaler Inhale 2 puffs into the lungs every 6 (six) hours as needed for wheezing or shortness of breath. 8 g 2  . aspirin EC 81 MG tablet Take 1 tablet (81 mg total) by mouth daily. 90 tablet 3  . glipiZIDE (GLUCOTROL) 5 MG tablet TAKE 1 TABLET ONCE DAILY. 30 tablet 2  . glucose blood test strip Use to check your blood glucose once a day. 100 each 3  . metFORMIN (GLUCOPHAGE-XR) 500 MG 24 hr tablet TAKE (1) TABLET TWICE DAILY. 60 tablet 3  . Metoprolol Tartrate 75 MG TABS Take 75 mg by mouth 2 (two) times daily. 60 tablet 1  . Multiple Vitamin (MULTIVITAMIN WITH MINERALS) TABS tablet Take 1 tablet by mouth daily. 90 tablet 2  . pantoprazole (PROTONIX) 40 MG tablet Take 1 tablet (40 mg total) by mouth daily. 90 tablet 0  . pravastatin (PRAVACHOL) 20 MG tablet Take 1 tablet (20 mg total) by mouth daily. 90 tablet 3  . vitamin B-12 (CYANOCOBALAMIN) 500 MCG tablet Take 1 tablet (500 mcg total) by mouth daily. 30 tablet 1   No current facility-administered medications for this visit.     Past Medical History:  Diagnosis Date  . AF (atrial fibrillation) ([place])   . Atrial flutter ([place])   . Blue toes    chronic  . Essential hypertension, benign 04/23/2019  . GERD (gastroesophageal reflux disease)   . Gout   . Guillain Barr syndrome ([place])   . Nuclear sclerotic cataract of left eye 05/07/2020  . Nuclear sclerotic cataract of right eye 05/07/2020  .  Posterior subcapsular polar age-related cataract of left eye 05/07/2020  . Type II diabetes mellitus, uncontrolled ([place]) 04/23/2019    ROS:   All systems reviewed and negative except as noted in the HPI.   Past Surgical History:  Procedure Laterality Date  . catheter ablation  12/23/2004  . COLONOSCOPY N/A 09/18/2014   Procedure: COLONOSCOPY;  Surgeon: Rogene Houston, MD;  [facility];  Service: Endoscopy;  Laterality: N/A;  830  . EYE SURGERY  2020   Bilateral cataract surgery  . FEMUR IM NAIL Right 06/25/2013   Procedure: INTRAMEDULLARY (IM) RETROGRADE FEMORAL NAILING;  Surgeon: Marybelle Killings, MD;  Location: [facility];  Service: Orthopedics;  Laterality: Right;     Family History  Problem Relation Age of Onset  . Diabetes Father   . Colon cancer Father   . Colon cancer Mother      Social History   Socioeconomic History  . Marital status: Married    Spouse name: Not on file  . Number of children: Not on file  . Years of education: Not on file  . Highest education level: Not on file  Occupational History  . Not on file  Tobacco Use  . Smoking status: Former Smoker    Packs/day: 2.00    Years: 5.00    Pack years: 10.00    Types:  Cigarettes    Start date: 11/23/2016    Quit date: 11/24/2016    Years since quitting: 3.5  . Smokeless tobacco: Never Used  Vaping Use  . Vaping Use: Never used  Substance and Sexual Activity  . Alcohol use: No  . Drug use: No  . Sexual activity: Not on file  Other Topics Concern  . Not on file  Social History Narrative   Married for 69 years,lives with wife.Retired.   Social Determinants of Health   Financial Resource Strain:   . Difficulty of Paying Living Expenses: Not on file  Food Insecurity:   . Worried About Charity fundraiser in the Last Year: Not on file  . Ran Out of Food in the Last Year: Not on file  Transportation Needs:   . Lack of Transportation (Medical): Not on file  . Lack of Transportation  (Non-Medical): Not on file  Physical Activity:   . Days of Exercise per Week: Not on file  . Minutes of Exercise per Session: Not on file  Stress:   . Feeling of Stress : Not on file  Social Connections:   . Frequency of Communication with Friends and Family: Not on file  . Frequency of Social Gatherings with Friends and Family: Not on file  . Attends Religious Services: Not on file  . Active Member of Clubs or Organizations: Not on file  . Attends Archivist Meetings: Not on file  . Marital Status: Not on file  Intimate Partner Violence:   . Fear of Current or Ex-Partner: Not on file  . Emotionally Abused: Not on file  . Physically Abused: Not on file  . Sexually Abused: Not on file     BP 122/82   Pulse 71   Ht 6' (1.829 m)   Wt 173 lb (78.5 kg)   SpO2 98%   BMI 23.46 kg/m   Physical Exam:  Well appearing NAD HEENT: Unremarkable Neck:  No JVD, no thyromegally Lymphatics:  No adenopathy Back:  No CVA tenderness Lungs:  Clear with no wheezes HEART:  Regular rate rhythm, no murmurs, no rubs, no clicks Abd:  soft, positive bowel sounds, no organomegally, no rebound, no guarding Ext:  2 plus pulses, no edema, no cyanosis, no clubbing Skin:  No rashes no nodules Neuro:  CN II through XII intact, motor grossly intact   Assess/Plan: 1. Chronic atrial fib - his VR is controlled and he is asymptomatic. I strongly encouraged him to take a blood thinner to reduce his stroke risk but he wants to hold off for now. If he changes his mind he is instructed to call and I would prescribe Eliquis or Xarelto. 2. HTN - his bp is well controlled. He will continue his current meds. 3. Weight loss - he is slowly improving but still down 12 lbs. We will follow.  Carleene Overlie Marcus Mcbryar,MD

## 2020-07-10 ENCOUNTER — Other Ambulatory Visit: Payer: Self-pay

## 2020-07-10 ENCOUNTER — Ambulatory Visit (INDEPENDENT_AMBULATORY_CARE_PROVIDER_SITE_OTHER): Payer: Medicare Other | Admitting: Internal Medicine

## 2020-07-10 ENCOUNTER — Encounter (INDEPENDENT_AMBULATORY_CARE_PROVIDER_SITE_OTHER): Payer: Self-pay | Admitting: Internal Medicine

## 2020-07-10 VITALS — BP 134/88 | HR 52 | Temp 97.1°F | Ht 72.0 in | Wt 180.8 lb

## 2020-07-10 DIAGNOSIS — E1140 Type 2 diabetes mellitus with diabetic neuropathy, unspecified: Secondary | ICD-10-CM | POA: Diagnosis not present

## 2020-07-10 DIAGNOSIS — I10 Essential (primary) hypertension: Secondary | ICD-10-CM

## 2020-07-10 DIAGNOSIS — E785 Hyperlipidemia, unspecified: Secondary | ICD-10-CM | POA: Diagnosis not present

## 2020-07-10 NOTE — Progress Notes (Signed)
Metrics: Intervention Frequency ACO  Documented Smoking Status Yearly  Screened one or more times in 24 months  Cessation Counseling or  Active cessation medication Past 24 months  Past 24 months   Guideline developer: UpToDate (See UpToDate for funding source) Date Released: 2014       Wellness Office Visit  Subjective:  Patient ID: Marcus [name], male    DOB: [birth year]  Age: 74 y.o. MRN: [id number]  CC: This man comes in for follow-up of his diabetes, hypertension and hyperlipidemia. HPI  He has a history of chronic atrial fibrillation but does not want to take anticoagulation despite strong recommendation from cardiology. He continues on glipizide and Metformin for his diabetes.  His last A1c was in the 6.5% range, not too bad. He continues on statin therapy for dyslipidemia in the face of diabetes Past Medical History:  Diagnosis Date  . AF (atrial fibrillation) ([place])   . Atrial flutter ([place])   . Blue toes    chronic  . Essential hypertension, benign 04/23/2019  . GERD (gastroesophageal reflux disease)   . Gout   . Guillain Barr syndrome ([place])   . Nuclear sclerotic cataract of left eye 05/07/2020  . Nuclear sclerotic cataract of right eye 05/07/2020  . Posterior subcapsular polar age-related cataract of left eye 05/07/2020  . Type II diabetes mellitus, uncontrolled ([place]) 04/23/2019   Past Surgical History:  Procedure Laterality Date  . catheter ablation  12/23/2004  . COLONOSCOPY N/A 09/18/2014   Procedure: COLONOSCOPY;  Surgeon: Rogene Houston, MD;  [facility];  Service: Endoscopy;  Laterality: N/A;  830  . EYE SURGERY  2020   Bilateral cataract surgery  . FEMUR IM NAIL Right 06/25/2013   Procedure: INTRAMEDULLARY (IM) RETROGRADE FEMORAL NAILING;  Surgeon: Marybelle Killings, MD;  Location: [facility];  Service: Orthopedics;  Laterality: Right;     Family History  Problem Relation Age of Onset  . Diabetes Father   . Colon cancer Father   . Colon  cancer Mother     Social History   Social History Narrative   Married for 2 years,lives with wife.Retired.   Social History   Tobacco Use  . Smoking status: Former Smoker    Packs/day: 2.00    Years: 5.00    Pack years: 10.00    Types: Cigarettes    Start date: 11/23/2016    Quit date: 11/24/2016    Years since quitting: 3.6  . Smokeless tobacco: Never Used  Substance Use Topics  . Alcohol use: No    Current Meds  Medication Sig  . albuterol (VENTOLIN HFA) 108 (90 Base) MCG/ACT inhaler Inhale 2 puffs into the lungs every 6 (six) hours as needed for wheezing or shortness of breath.  Marland Kitchen aspirin EC 81 MG tablet Take 1 tablet (81 mg total) by mouth daily.  Marland Kitchen glipiZIDE (GLUCOTROL) 5 MG tablet TAKE 1 TABLET ONCE DAILY.  Marland Kitchen glucose blood test strip Use to check your blood glucose once a day.  . metFORMIN (GLUCOPHAGE-XR) 500 MG 24 hr tablet TAKE (1) TABLET TWICE DAILY.  . Metoprolol Tartrate 75 MG TABS Take 75 mg by mouth 2 (two) times daily.  . Multiple Vitamin (MULTIVITAMIN WITH MINERALS) TABS tablet Take 1 tablet by mouth daily.  . pravastatin (PRAVACHOL) 20 MG tablet Take 1 tablet (20 mg total) by mouth daily.  . vitamin B-12 (CYANOCOBALAMIN) 500 MCG tablet Take 1 tablet (500 mcg total) by mouth daily.      Depression screen  [facility] 2/9 05/27/2020 08/01/2019  Decreased Interest 0 0  Down, Depressed, Hopeless 0 0  PHQ - 2 Score 0 0  Altered sleeping 0 -  Tired, decreased energy 0 -  Change in appetite 0 -  Feeling bad or failure about yourself  0 -  Trouble concentrating 0 -  Moving slowly or fidgety/restless 0 -  Suicidal thoughts 0 -  PHQ-9 Score 0 -  Difficult doing work/chores Not difficult at all -     Objective:   Today's Vitals: BP 134/88   Pulse (!) 52   Temp (!) 97.1 F (36.2 C) (Temporal)   Ht 6' (1.829 m)   Wt 180 lb 12.8 oz (82 kg)   SpO2 96%   BMI 24.52 kg/m  Vitals with BMI 07/10/2020 06/27/2020 05/27/2020  Height 6' 0"  6' 0"  5' 11"   Weight 180 lbs 13 oz  173 lbs 179 lbs 3 oz  BMI 56.81 27.51 25  Systolic 700 174 944  Diastolic 88 82 78  Pulse 52 71 53     Physical Exam  He looks systemically well.  He has gained about 7 pounds since last time he was seen in the system.  Blood pressure is borderline elevated.     Assessment   1. Type 2 diabetes mellitus with diabetic neuropathy, without long-term current use of insulin ([place])   2. Essential hypertension, benign   3. Hyperlipidemia, unspecified hyperlipidemia type       Tests ordered Orders Placed This Encounter  Procedures  . COMPLETE METABOLIC PANEL WITH GFR  . Lipid panel  . Hemoglobin A1c     Plan: 1. He will continue with glipizide and Metformin for his diabetes and we will check an A1c. 2. He will continue with metoprolol for hypertension. 3. He will continue with statin therapy and we will check a lipid panel. 4. Further recommendations will depend on blood results and he will follow-up with Sarah in 3 months.  I did recommend COVID-19 vaccination.  I told him I was not aware that COVID-19 vaccination was associated with/resulted in [place] barre syndrome.   No orders of the defined types were placed in this encounter.   Doree Albee, MD

## 2020-07-11 LAB — COMPLETE METABOLIC PANEL WITH GFR
AG Ratio: 2 (calc) (ref 1.0–2.5)
ALT: 14 U/L (ref 9–46)
AST: 18 U/L (ref 10–35)
Albumin: 4.3 g/dL (ref 3.6–5.1)
Alkaline phosphatase (APISO): 76 U/L (ref 35–144)
BUN/Creatinine Ratio: 27 (calc) — ABNORMAL HIGH (ref 6–22)
BUN: 36 mg/dL — ABNORMAL HIGH (ref 7–25)
CO2: 26 mmol/L (ref 20–32)
Calcium: 9.8 mg/dL (ref 8.6–10.3)
Chloride: 104 mmol/L (ref 98–110)
Creat: 1.31 mg/dL — ABNORMAL HIGH (ref 0.70–1.18)
GFR, Est African American: 61 mL/min/{1.73_m2} (ref >=60)
GFR, Est Non African American: 53 mL/min/{1.73_m2} — ABNORMAL LOW (ref >=60)
Globulin: 2.2 g/dL (calc) (ref 1.9–3.7)
Glucose, Bld: 158 mg/dL — ABNORMAL HIGH (ref 65–139)
Potassium: 5.2 mmol/L (ref 3.5–5.3)
Sodium: 141 mmol/L (ref 135–146)
Total Bilirubin: 0.4 mg/dL (ref 0.2–1.2)
Total Protein: 6.5 g/dL (ref 6.1–8.1)

## 2020-07-11 LAB — HEMOGLOBIN A1C
Hgb A1c MFr Bld: 6.1 % of total Hgb — ABNORMAL HIGH (ref <5.7)
Mean Plasma Glucose: 128 mg/dL
eAG (mmol/L): 7.1 mmol/L

## 2020-07-11 LAB — LIPID PANEL
Cholesterol: 117 mg/dL (ref <200)
HDL: 33 mg/dL — ABNORMAL LOW (ref >=40)
LDL Cholesterol (Calc): 61 mg/dL (calc)
Non-HDL Cholesterol (Calc): 84 mg/dL (calc) (ref <130)
Total CHOL/HDL Ratio: 3.5 (calc) (ref <5.0)
Triglycerides: 155 mg/dL — ABNORMAL HIGH (ref <150)

## 2020-08-07 ENCOUNTER — Other Ambulatory Visit (INDEPENDENT_AMBULATORY_CARE_PROVIDER_SITE_OTHER): Payer: Self-pay | Admitting: Internal Medicine

## 2020-08-07 DIAGNOSIS — K219 Gastro-esophageal reflux disease without esophagitis: Secondary | ICD-10-CM

## 2020-08-15 ENCOUNTER — Other Ambulatory Visit: Payer: Self-pay | Admitting: Internal Medicine

## 2020-08-22 ENCOUNTER — Other Ambulatory Visit (INDEPENDENT_AMBULATORY_CARE_PROVIDER_SITE_OTHER): Payer: Self-pay | Admitting: Internal Medicine

## 2020-08-29 ENCOUNTER — Other Ambulatory Visit (INDEPENDENT_AMBULATORY_CARE_PROVIDER_SITE_OTHER): Payer: Self-pay | Admitting: Internal Medicine

## 2020-09-03 ENCOUNTER — Ambulatory Visit (INDEPENDENT_AMBULATORY_CARE_PROVIDER_SITE_OTHER): Payer: Medicare Other | Admitting: Podiatry

## 2020-09-03 ENCOUNTER — Other Ambulatory Visit: Payer: Self-pay

## 2020-09-03 ENCOUNTER — Encounter: Payer: Self-pay | Admitting: Podiatry

## 2020-09-03 DIAGNOSIS — E1151 Type 2 diabetes mellitus with diabetic peripheral angiopathy without gangrene: Secondary | ICD-10-CM

## 2020-09-03 DIAGNOSIS — L84 Corns and callosities: Secondary | ICD-10-CM | POA: Diagnosis not present

## 2020-09-03 DIAGNOSIS — G610 Guillain-Barre syndrome: Secondary | ICD-10-CM

## 2020-09-03 DIAGNOSIS — B351 Tinea unguium: Secondary | ICD-10-CM | POA: Diagnosis not present

## 2020-09-03 DIAGNOSIS — E119 Type 2 diabetes mellitus without complications: Secondary | ICD-10-CM | POA: Insufficient documentation

## 2020-09-03 DIAGNOSIS — M79675 Pain in left toe(s): Secondary | ICD-10-CM

## 2020-09-03 DIAGNOSIS — M79674 Pain in right toe(s): Secondary | ICD-10-CM | POA: Diagnosis not present

## 2020-09-07 NOTE — Progress Notes (Signed)
Subjective: Marcus [name] presents today at risk foot care. Pt has h/o NIDDM with PAD and painful callus(es) b/l feet and painful mycotic toenails b/l that are difficult to trim. Pain interferes with ambulation. Aggravating factors include wearing enclosed shoe gear. Pain is relieved with periodic professional debridement.   Pt also has h/o GBS. He voices no new pedal problems on today's visit. He voices no new pedal concerns on today's visit.  Marcus Albee, MD is patient's PCP.  Last visit was 07/10/2020.  Past Medical History:  Diagnosis Date  . AF (atrial fibrillation) ([place])   . Atrial flutter ([place])   . Blue toes    chronic  . Essential hypertension, benign 04/23/2019  . GERD (gastroesophageal reflux disease)   . Gout   . Guillain Barr syndrome ([place])   . Nuclear sclerotic cataract of left eye 05/07/2020  . Nuclear sclerotic cataract of right eye 05/07/2020  . Posterior subcapsular polar age-related cataract of left eye 05/07/2020  . Type II diabetes mellitus, uncontrolled ([place]) 04/23/2019     Current Outpatient Medications on File Prior to Visit  Medication Sig Dispense Refill  . albuterol (VENTOLIN HFA) 108 (90 Base) MCG/ACT inhaler Inhale 2 puffs into the lungs every 6 (six) hours as needed for wheezing or shortness of breath. 8 g 2  . aspirin EC 81 MG tablet Take 1 tablet (81 mg total) by mouth daily. 90 tablet 3  . glipiZIDE (GLUCOTROL) 5 MG tablet TAKE 1 TABLET ONCE DAILY. 30 tablet 3  . glucose blood test strip Use to check your blood glucose once a day. 100 each 3  . metFORMIN (GLUCOPHAGE-XR) 500 MG 24 hr tablet TAKE (1) TABLET TWICE DAILY. 60 tablet 3  . metoprolol tartrate (LOPRESSOR) 25 MG tablet TAKE 3 TABLETS BY MOUTH TWICE DAILY. 180 tablet 2  . Multiple Vitamin (MULTIVITAMIN WITH MINERALS) TABS tablet Take 1 tablet by mouth daily. 90 tablet 2  . pantoprazole (PROTONIX) 40 MG tablet TAKE 1 TABLET ONCE DAILY. 90 tablet 0  . pravastatin (PRAVACHOL) 20  MG tablet Take 1 tablet (20 mg total) by mouth daily. 90 tablet 3  . vitamin B-12 (CYANOCOBALAMIN) 500 MCG tablet Take 1 tablet (500 mcg total) by mouth daily. 30 tablet 1   No current facility-administered medications on file prior to visit.     Allergies  Allergen Reactions  . Influenza Vaccines Other (See Comments)    Guillain-Barre syndrome    Objective: Marcus [name] is a pleasant 75 y.o. y.o. Patient Race: White or Caucasian [1]  male in NAD. AAO x 3.  There were no vitals filed for this visit.  Vascular Examination: Neurovascular status unchanged b/l lower extremities. Capillary refill time to digits <4 seconds b/l. Nonpalpable DP pulse(s) right foot. Nonpalpable PT pulse(s) left foot. DP pulse palpable left foot. PT pulse palpable right foot. Pedal hair absent b/l. Skin temperature gradient warm to cool b/l. Cyanotic appearance of digits 1-5 when feet are in dependent position. Pallor noted with LE elevation b/l. No pain with calf compression b/l. +1 pitting edema b/l lower extremities. Evidence of chronic venous insufficiency b/l LE.  Dermatological Examination: Pedal skin is thin shiny, atrophic b/l lower extremities. No open wounds bilaterally. No interdigital macerations bilaterally. Toenails 1-5 b/l elongated, discolored, dystrophic, thickened, crumbly with subungual debris and tenderness to dorsal palpation. Hyperkeratotic lesion(s) submet head 1 left foot, submet head 1 right foot and submet head 5 right foot.  No erythema, no edema, no drainage, no fluctuance.  Musculoskeletal: Normal muscle strength 5/5 to all lower extremity muscle groups bilaterally. No pain crepitus or joint limitation noted with ROM b/l. Hallux valgus with bunion deformity noted b/l lower extremities. Hammertoes noted to the L 5th toe and R 5th toe.  Neurological Examination: Protective sensation intact 5/5 intact bilaterally with 10g monofilament b/l. Vibratory sensation intact b/l.  Proprioception intact bilaterally.  Assessment: 1. Pain due to onychomycosis of toenails of both feet   2. Callus   3. Type II diabetes mellitus with peripheral circulatory disorder (HCC)   4. GBS (Guillain Barre syndrome) ([place])     Plan: -Examined patient. -No new findings. No new orders. -Continue diabetic foot care principles. -For healing wound left 3rd toe, patient instructed to apply triple antibiotic ointment to digit once daily for 2 weeks. -Toenails 1-5 b/l were debrided in length and girth with sterile nail nippers and dremel without iatrogenic bleeding.  -Callus(es) submet head 1 b/l and submet head 5 right foot pared utilizing sterile scalpel blade without complication or incident. Total number debrided =3. -Patient to report any pedal injuries to medical professional immediately.  Return in about 3 months (around 12/01/2020).  Marcus [name], DPM

## 2020-10-08 ENCOUNTER — Encounter (INDEPENDENT_AMBULATORY_CARE_PROVIDER_SITE_OTHER): Payer: Self-pay | Admitting: Internal Medicine

## 2020-10-08 ENCOUNTER — Ambulatory Visit (INDEPENDENT_AMBULATORY_CARE_PROVIDER_SITE_OTHER): Payer: Medicare Other | Admitting: Internal Medicine

## 2020-10-08 ENCOUNTER — Other Ambulatory Visit: Payer: Self-pay

## 2020-10-08 VITALS — BP 110/72 | Temp 97.5°F | Ht 72.0 in | Wt 178.4 lb

## 2020-10-08 DIAGNOSIS — E1140 Type 2 diabetes mellitus with diabetic neuropathy, unspecified: Secondary | ICD-10-CM | POA: Diagnosis not present

## 2020-10-08 DIAGNOSIS — I10 Essential (primary) hypertension: Secondary | ICD-10-CM | POA: Diagnosis not present

## 2020-10-08 DIAGNOSIS — E785 Hyperlipidemia, unspecified: Secondary | ICD-10-CM

## 2020-10-08 NOTE — Progress Notes (Signed)
Metrics: Intervention Frequency ACO  Documented Smoking Status Yearly  Screened one or more times in 24 months  Cessation Counseling or  Active cessation medication Past 24 months  Past 24 months   Guideline developer: UpToDate (See UpToDate for funding source) Date Released: 2014       Wellness Office Visit  Subjective:  Patient ID: Marcus [name], male    DOB: 1945-10-11  Age: 75 y.o. MRN: [id number]  CC: This man comes in for follow-up of hypertension, diabetes, dyslipidemia. HPI  He is doing reasonably well and has no specific complaints. He continues on Metformin and glipizide for his diabetes.  His last hemoglobin A1c was 6.1% showing good control compared to the previous time. He does have a history of diabetic neuropathy.  He seems to be managing well with this. He continues with metoprolol for hypertension. He continues for pravastatin for dyslipidemia in the face of diabetes.  His last cholesterol numbers were in a good range. Past Medical History:  Diagnosis Date  . AF (atrial fibrillation) ([place])   . Atrial flutter ([place])   . Blue toes    chronic  . Essential hypertension, benign 04/23/2019  . GERD (gastroesophageal reflux disease)   . Gout   . Guillain Barr syndrome ([place])   . Nuclear sclerotic cataract of left eye 05/07/2020  . Nuclear sclerotic cataract of right eye 05/07/2020  . Posterior subcapsular polar age-related cataract of left eye 05/07/2020  . Type II diabetes mellitus, uncontrolled ([place]) 04/23/2019   Past Surgical History:  Procedure Laterality Date  . catheter ablation  12/23/2004  . COLONOSCOPY N/A 09/18/2014   Procedure: COLONOSCOPY;  Surgeon: Rogene Houston, MD;  [facility];  Service: Endoscopy;  Laterality: N/A;  830  . EYE SURGERY  2020   Bilateral cataract surgery  . FEMUR IM NAIL Right 06/25/2013   Procedure: INTRAMEDULLARY (IM) RETROGRADE FEMORAL NAILING;  Surgeon: Marybelle Killings, MD;  Location: [facility];  Service:  Orthopedics;  Laterality: Right;     Family History  Problem Relation Age of Onset  . Diabetes Father   . Colon cancer Father   . Colon cancer Mother     Social History   Social History Narrative   Married for 31 years,lives with wife.Retired.   Social History   Tobacco Use  . Smoking status: Former Smoker    Packs/day: 2.00    Years: 5.00    Pack years: 10.00    Types: Cigarettes    Start date: 11/23/2016    Quit date: 11/24/2016    Years since quitting: 3.8  . Smokeless tobacco: Never Used  Substance Use Topics  . Alcohol use: No    Current Meds  Medication Sig  . albuterol (VENTOLIN HFA) 108 (90 Base) MCG/ACT inhaler Inhale 2 puffs into the lungs every 6 (six) hours as needed for wheezing or shortness of breath.  Marland Kitchen aspirin EC 81 MG tablet Take 1 tablet (81 mg total) by mouth daily.  Marland Kitchen glipiZIDE (GLUCOTROL) 5 MG tablet TAKE 1 TABLET ONCE DAILY.  Marland Kitchen glucose blood test strip Use to check your blood glucose once a day.  . metFORMIN (GLUCOPHAGE-XR) 500 MG 24 hr tablet TAKE (1) TABLET TWICE DAILY.  . metoprolol tartrate (LOPRESSOR) 25 MG tablet TAKE 3 TABLETS BY MOUTH TWICE DAILY.  . Multiple Vitamin (MULTIVITAMIN WITH MINERALS) TABS tablet Take 1 tablet by mouth daily.  . pantoprazole (PROTONIX) 40 MG tablet TAKE 1 TABLET ONCE DAILY.  . pravastatin (PRAVACHOL) 20 MG  tablet Take 1 tablet (20 mg total) by mouth daily.  . vitamin B-12 (CYANOCOBALAMIN) 500 MCG tablet Take 1 tablet (500 mcg total) by mouth daily.     [place] Office Visit from 10/08/2020 in [facility]  PHQ-9 Total Score 0      Objective:   Today's Vitals: BP 110/72   Temp (!) 97.5 F (36.4 C) (Temporal)   Ht 6' (1.829 m)   Wt 178 lb 6.4 oz (80.9 kg)   BMI 24.20 kg/m  Vitals with BMI 10/08/2020 07/10/2020 06/27/2020  Height 6' 0"  6' 0"  6' 0"   Weight 178 lbs 6 oz 180 lbs 13 oz 173 lbs  BMI 24.19 46.27 03.50  Systolic 093 818 299  Diastolic 72 88 82  Pulse (No Data) 52 71      Physical Exam  He looks systemically well.  Blood pressures well controlled.  No new physical findings.     Assessment   1. Type 2 diabetes mellitus with diabetic neuropathy, without long-term current use of insulin ([place])   2. Essential hypertension, benign   3. Hyperlipidemia, unspecified hyperlipidemia type       Tests ordered Orders Placed This Encounter  Procedures  . COMPLETE METABOLIC PANEL WITH GFR  . Hemoglobin A1c  . Microalbumin, urine     Plan: 1. Continue with Metformin and glipizide we will check an A1c again today. 2. Continue with antihypertensive medications which is keeping his blood pressure under good control.  Check renal function. 3. Continue with pravastatin for his dyslipidemia. 4. Further recommendations will depend on above results and he will follow-up with Sarah in about 4 months time.   No orders of the defined types were placed in this encounter.   Doree Albee, MD

## 2020-10-09 LAB — COMPLETE METABOLIC PANEL WITH GFR
AG Ratio: 2.2 (calc) (ref 1.0–2.5)
ALT: 11 U/L (ref 9–46)
AST: 24 U/L (ref 10–35)
Albumin: 4.3 g/dL (ref 3.6–5.1)
Alkaline phosphatase (APISO): 84 U/L (ref 35–144)
BUN/Creatinine Ratio: 26 (calc) — ABNORMAL HIGH (ref 6–22)
BUN: 35 mg/dL — ABNORMAL HIGH (ref 7–25)
CO2: 25 mmol/L (ref 20–32)
Calcium: 9.7 mg/dL (ref 8.6–10.3)
Chloride: 105 mmol/L (ref 98–110)
Creat: 1.36 mg/dL — ABNORMAL HIGH (ref 0.70–1.18)
GFR, Est African American: 58 mL/min/{1.73_m2} — ABNORMAL LOW (ref >=60)
GFR, Est Non African American: 50 mL/min/{1.73_m2} — ABNORMAL LOW (ref >=60)
Globulin: 2 g/dL (calc) (ref 1.9–3.7)
Glucose, Bld: 160 mg/dL — ABNORMAL HIGH (ref 65–139)
Potassium: 6 mmol/L — ABNORMAL HIGH (ref 3.5–5.3)
Sodium: 141 mmol/L (ref 135–146)
Total Bilirubin: 0.6 mg/dL (ref 0.2–1.2)
Total Protein: 6.3 g/dL (ref 6.1–8.1)

## 2020-10-09 LAB — MICROALBUMIN, URINE: Microalb, Ur: 1.5 mg/dL

## 2020-10-09 LAB — HEMOGLOBIN A1C
Hgb A1c MFr Bld: 6.4 % of total Hgb — ABNORMAL HIGH (ref <5.7)
Mean Plasma Glucose: 137 mg/dL
eAG (mmol/L): 7.6 mmol/L

## 2020-10-15 ENCOUNTER — Other Ambulatory Visit: Payer: Self-pay | Admitting: Internal Medicine

## 2020-10-16 NOTE — Telephone Encounter (Signed)
This is a [place] pt.  °

## 2020-10-22 ENCOUNTER — Encounter (INDEPENDENT_AMBULATORY_CARE_PROVIDER_SITE_OTHER): Payer: Self-pay | Admitting: Internal Medicine

## 2020-10-22 ENCOUNTER — Other Ambulatory Visit: Payer: Self-pay

## 2020-10-22 ENCOUNTER — Ambulatory Visit (INDEPENDENT_AMBULATORY_CARE_PROVIDER_SITE_OTHER): Payer: Medicare Other | Admitting: Internal Medicine

## 2020-10-22 VITALS — BP 114/80 | HR 82 | Temp 97.3°F | Ht 72.0 in | Wt 177.0 lb

## 2020-10-22 DIAGNOSIS — Z125 Encounter for screening for malignant neoplasm of prostate: Secondary | ICD-10-CM

## 2020-10-22 DIAGNOSIS — E875 Hyperkalemia: Secondary | ICD-10-CM | POA: Diagnosis not present

## 2020-10-22 NOTE — Progress Notes (Signed)
Metrics: Intervention Frequency ACO  Documented Smoking Status Yearly  Screened one or more times in 24 months  Cessation Counseling or  Active cessation medication Past 24 months  Past 24 months   Guideline developer: UpToDate (See UpToDate for funding source) Date Released: 2014       Wellness Office Visit  Subjective:  Patient ID: Marcus [name], male    DOB: [birth year]  Age: 75 y.o. MRN: [id number]  CC: This man comes in for follow-up earlier than anticipated at my request because of hyperkalemia on his previous blood work. HPI  I encouraged him to drink more water and keep better hydrated and hopefully his numbers are improved. He also requests a PSA to be done. Past Medical History:  Diagnosis Date  . AF (atrial fibrillation) ([place])   . Atrial flutter ([place])   . Blue toes    chronic  . Essential hypertension, benign 04/23/2019  . GERD (gastroesophageal reflux disease)   . Gout   . Guillain Barr syndrome ([place])   . Nuclear sclerotic cataract of left eye 05/07/2020  . Nuclear sclerotic cataract of right eye 05/07/2020  . Posterior subcapsular polar age-related cataract of left eye 05/07/2020  . Type II diabetes mellitus, uncontrolled ([place]) 04/23/2019   Past Surgical History:  Procedure Laterality Date  . catheter ablation  12/23/2004  . COLONOSCOPY N/A 09/18/2014   Procedure: COLONOSCOPY;  Surgeon: Rogene Houston, MD;  [facility];  Service: Endoscopy;  Laterality: N/A;  830  . EYE SURGERY  2020   Bilateral cataract surgery  . FEMUR IM NAIL Right 06/25/2013   Procedure: INTRAMEDULLARY (IM) RETROGRADE FEMORAL NAILING;  Surgeon: Marybelle Killings, MD;  Location: [facility];  Service: Orthopedics;  Laterality: Right;     Family History  Problem Relation Age of Onset  . Diabetes Father   . Colon cancer Father   . Colon cancer Mother     Social History   Social History Narrative   Married for 8 years,lives with wife.Retired.   Social History    Tobacco Use  . Smoking status: Former Smoker    Packs/day: 2.00    Years: 5.00    Pack years: 10.00    Types: Cigarettes    Start date: 11/23/2016    Quit date: 11/24/2016    Years since quitting: 3.9  . Smokeless tobacco: Never Used  Substance Use Topics  . Alcohol use: No    Current Meds  Medication Sig  . albuterol (VENTOLIN HFA) 108 (90 Base) MCG/ACT inhaler Inhale 2 puffs into the lungs every 6 (six) hours as needed for wheezing or shortness of breath.  Marland Kitchen aspirin EC 81 MG tablet Take 1 tablet (81 mg total) by mouth daily.  Marland Kitchen glucose blood test strip Use to check your blood glucose once a day.  . metoprolol tartrate (LOPRESSOR) 25 MG tablet TAKE 3 TABLETS BY MOUTH TWICE DAILY.  . Multiple Vitamin (MULTIVITAMIN WITH MINERALS) TABS tablet Take 1 tablet by mouth daily.  . pantoprazole (PROTONIX) 40 MG tablet TAKE 1 TABLET ONCE DAILY.  . pravastatin (PRAVACHOL) 20 MG tablet Take 1 tablet (20 mg total) by mouth daily.  . vitamin B-12 (CYANOCOBALAMIN) 500 MCG tablet Take 1 tablet (500 mcg total) by mouth daily.     [facility] Visit from 10/08/2020 in [facility]  PHQ-9 Total Score 0      Objective:   Today's Vitals: BP 114/80   Pulse 82   Temp (!) 97.3 F (36.3  C) (Temporal)   Ht 6' (1.829 m)   Wt 177 lb (80.3 kg)   SpO2 94%   BMI 24.01 kg/m  Vitals with BMI 10/22/2020 10/08/2020 07/10/2020  Height 6' 0"  6' 0"  6' 0"   Weight 177 lbs 178 lbs 6 oz 180 lbs 13 oz  BMI 24 01.77 93.90  Systolic 300 923 300  Diastolic 80 72 88  Pulse 82 (No Data) 52     Physical Exam  He looks systemically well.  Blood pressure is in a good range.     Assessment   1. Hyperkalemia   2. Special screening for malignant neoplasm of prostate       Tests ordered Orders Placed This Encounter  Procedures  . Basic metabolic panel  . PSA, Total with Reflex to PSA, Free     Plan: 1. We will check basic metabolic panel and also a PSA. 2. Further recommendations  will depend on blood results. 3. Follow-up as scheduled with Sarah.   No orders of the defined types were placed in this encounter.   Doree Albee, MD

## 2020-10-23 LAB — PSA, TOTAL WITH REFLEX TO PSA, FREE: PSA, Total: 0.6 ng/mL (ref <=4.0)

## 2020-10-23 LAB — BASIC METABOLIC PANEL
BUN/Creatinine Ratio: 27 (calc) — ABNORMAL HIGH (ref 6–22)
BUN: 32 mg/dL — ABNORMAL HIGH (ref 7–25)
CO2: 27 mmol/L (ref 20–32)
Calcium: 9.7 mg/dL (ref 8.6–10.3)
Chloride: 106 mmol/L (ref 98–110)
Creat: 1.19 mg/dL — ABNORMAL HIGH (ref 0.70–1.18)
Glucose, Bld: 122 mg/dL — ABNORMAL HIGH (ref 65–99)
Potassium: 5.4 mmol/L — ABNORMAL HIGH (ref 3.5–5.3)
Sodium: 143 mmol/L (ref 135–146)

## 2020-10-25 IMAGING — DX DG CHEST 1V PORT
1 series · 1 of 1 positions shown · non-contrast
Comparison: None.

CLINICAL DATA: 74-year-old male with sepsis.

EXAM:
PORTABLE CHEST 1 VIEW

[chest ap]
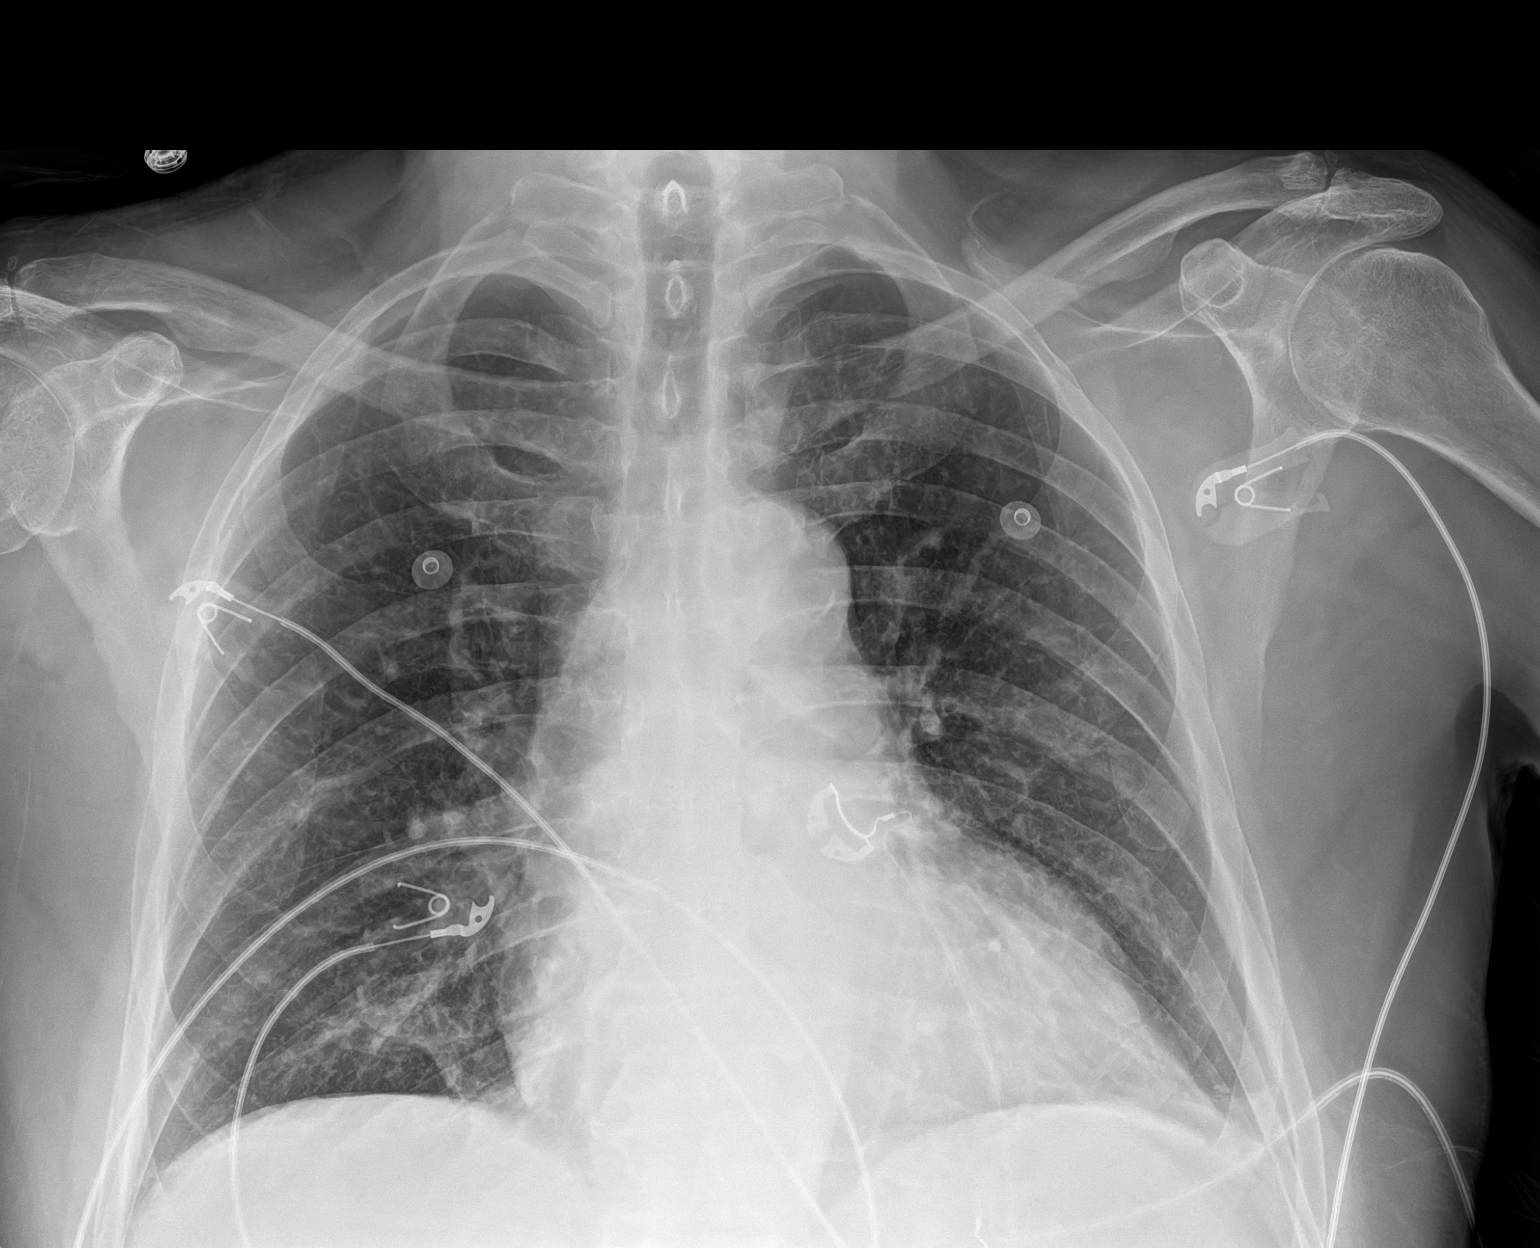

[1 of 1 positions shown; findings below may reference images not displayed]

FINDINGS: Minimal left lung base atelectasis. Mild cardiomegaly. No congestion
or edema. No focal consolidation, pleural effusion, or pneumothorax.
Atherosclerotic calcification of the aortic arch. No acute osseous
pathology.
IMPRESSION: No focal consolidation.
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
## 2020-11-04 IMAGING — CT CT ANGIO CHEST
2 of 6 series · 18 of 46 positions shown · IV contrast (Omnipaque or Isovue)
Comparison: Radiograph earlier today.

CLINICAL DATA: PE suspected, low/intermediate prob, positive
D-dimer

COVID positive 03/03/2020
EXAM:
CT ANGIOGRAPHY CHEST WITH CONTRAST
TECHNIQUE: Multidetector CT imaging of the chest was performed using the
standard protocol during bolus administration of intravenous
contrast. Multiplanar CT image reconstructions and MIPs were
obtained to evaluate the vascular anatomy.
CONTRAST:  75mL OMNIPAQUE IOHEXOL 350 MG/ML SOLN

[Series 6: pe axial thins · axial · 0.84mm/px · z∈[+1043,+1316]mm · 15 of 300 slices shown]
[im 14/300  lung]
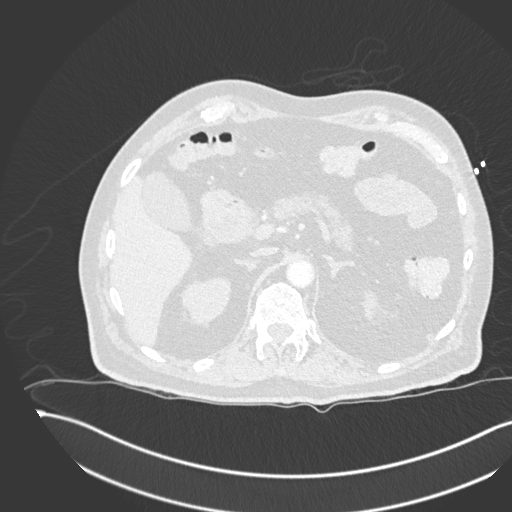
[im 40/300  soft-tissue]
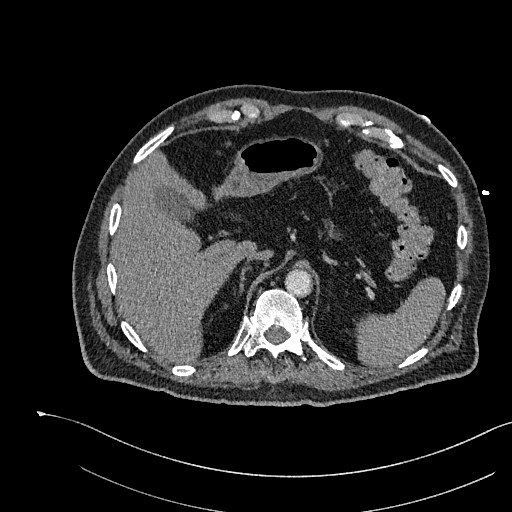
[im 53/300  lung]
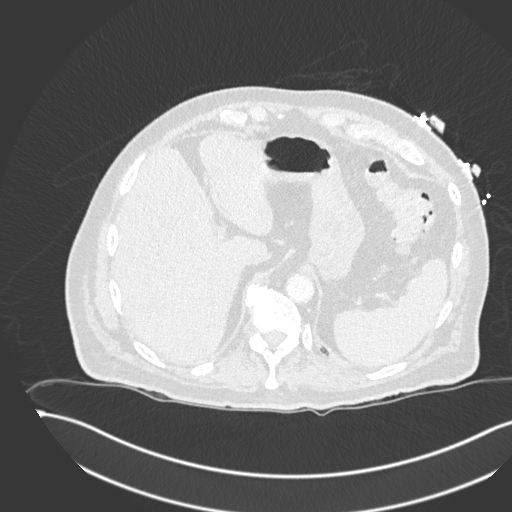
[im 79/300  soft-tissue]
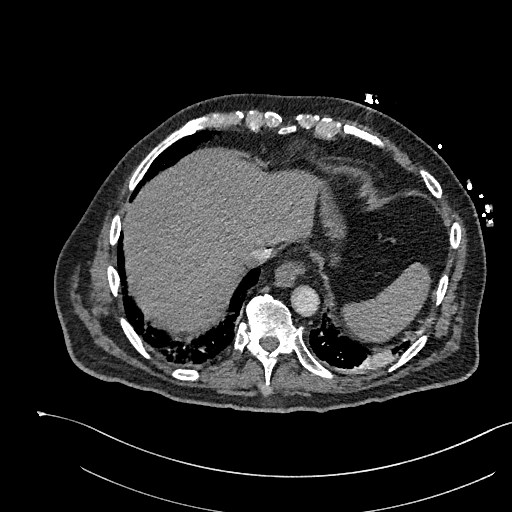
[im 92/300  lung]
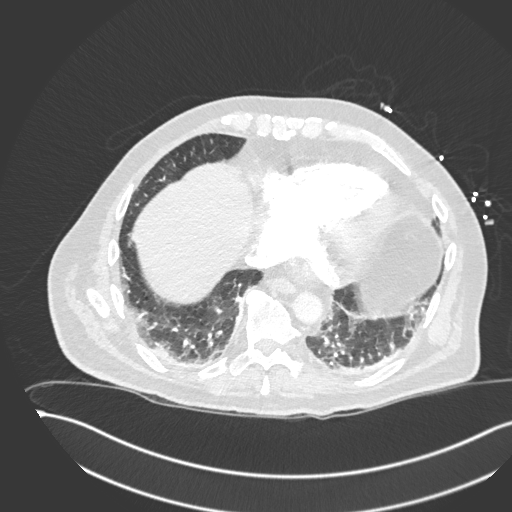
[im 118/300  soft-tissue]
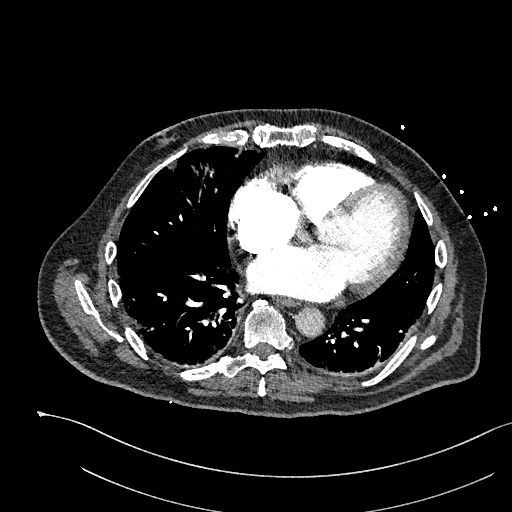
[im 131/300  lung]
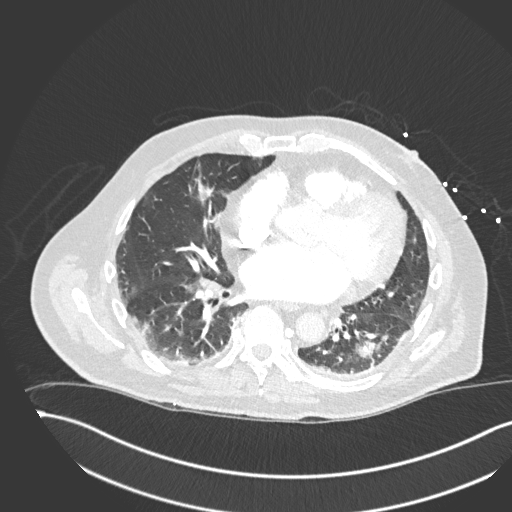
[im 157/300  soft-tissue]
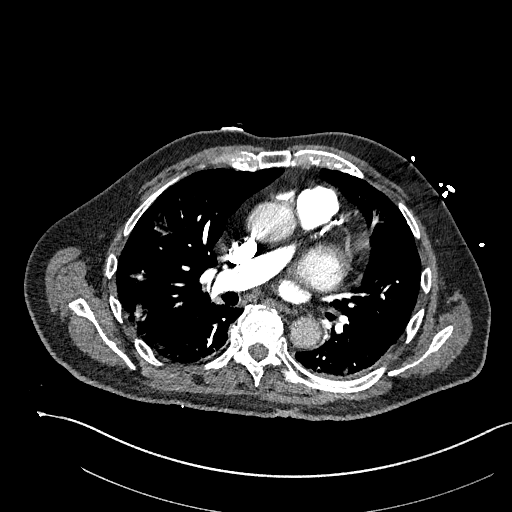
[im 170/300  lung]
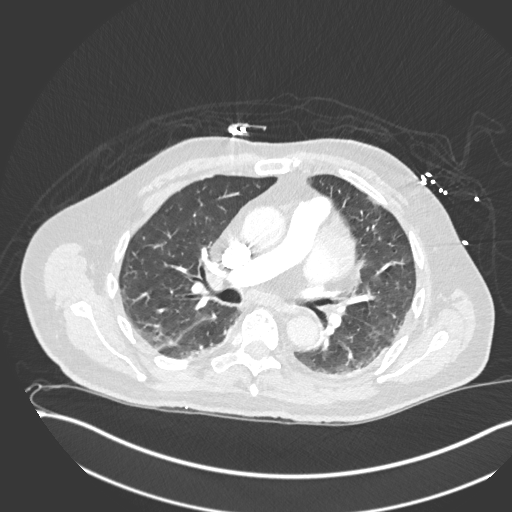
[im 183/300  soft-tissue]
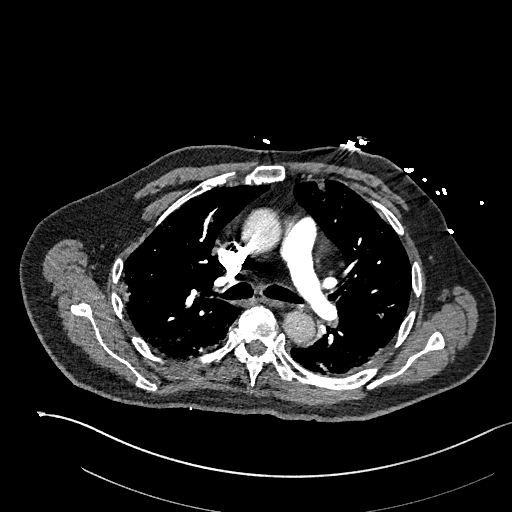
[im 209/300  lung]
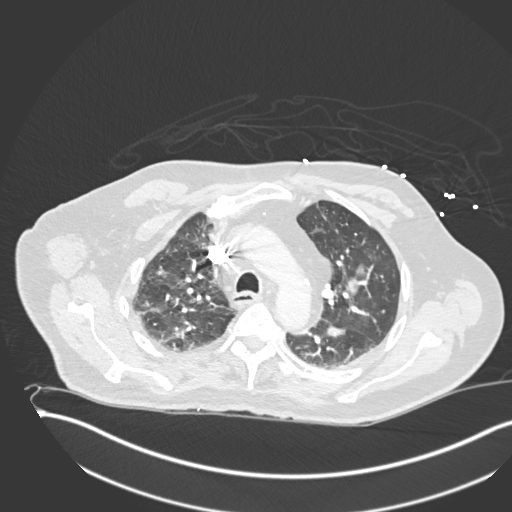
[im 222/300  soft-tissue]
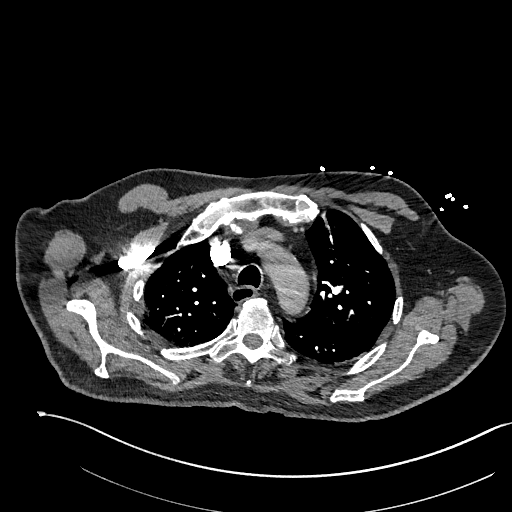
[im 248/300  lung]
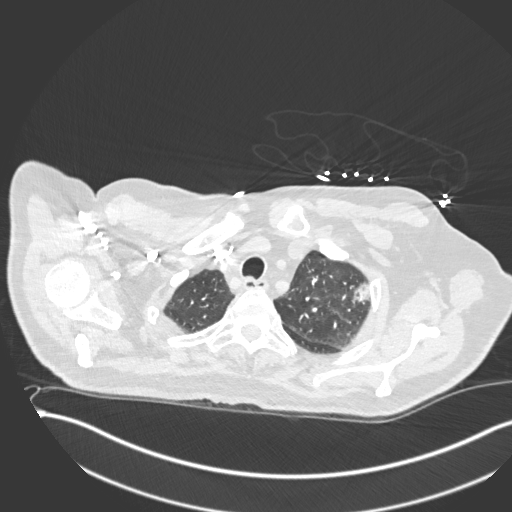
[im 261/300  soft-tissue]
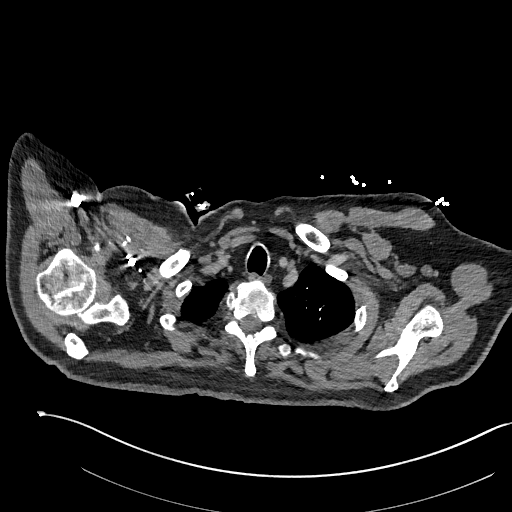
[im 287/300  lung]
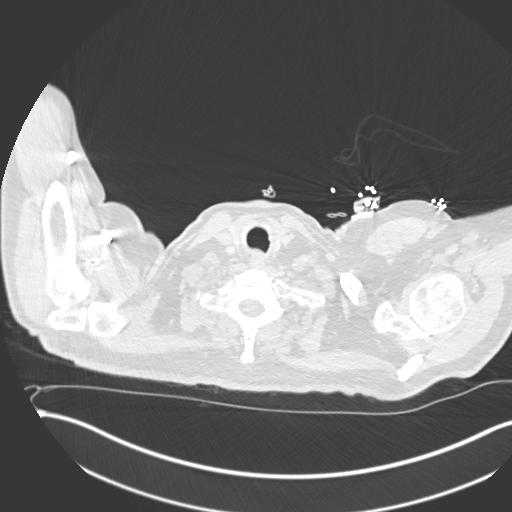

[Series 8: cor soft · coronal · 0.68mm/px · 3 of 159 slices shown]
[im 40/159  soft-tissue]
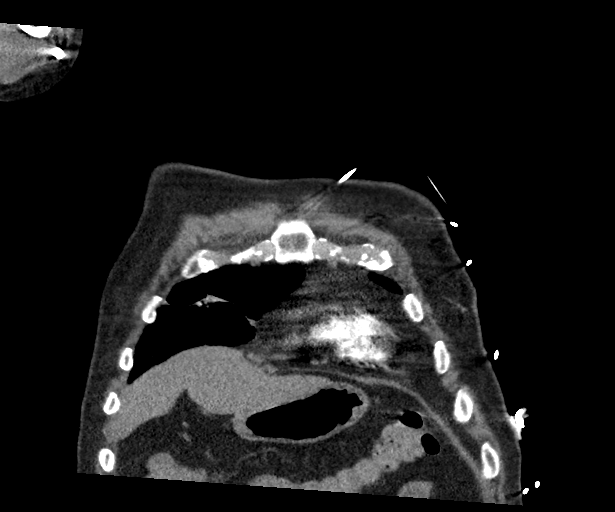
[im 80/159  soft-tissue]
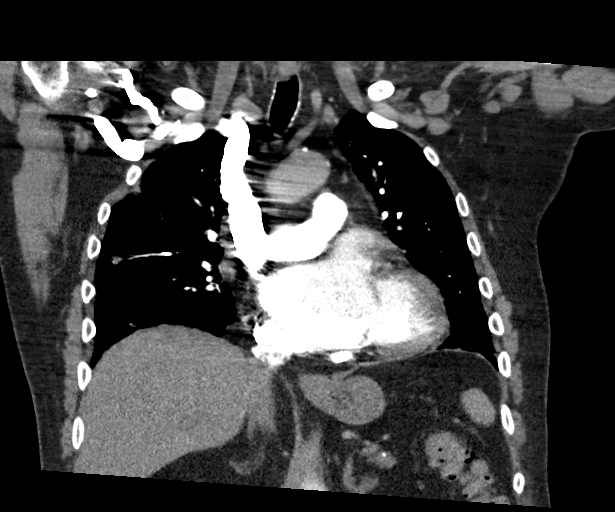
[im 119/159  soft-tissue]
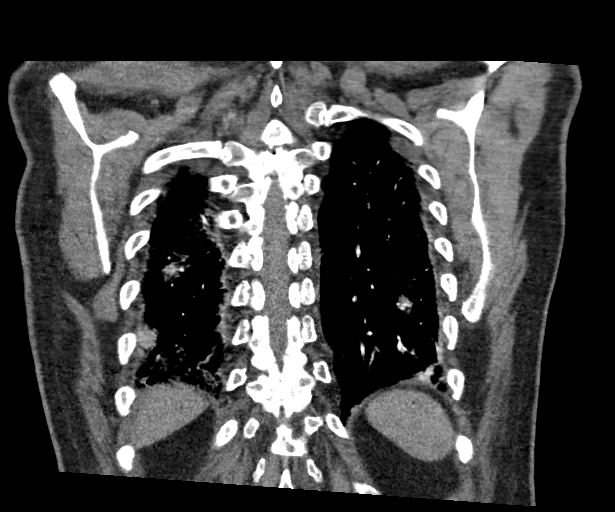

[18 of 46 positions shown; findings below may reference images not displayed]

FINDINGS: Cardiovascular: There are no filling defects within the pulmonary
arteries to suggest pulmonary embolus. Thoracic aorta is normal in
caliber. Mild aortic atherosclerosis. Mild multi chamber
cardiomegaly. No pericardial effusion. Scattered coronary artery
calcifications.

Mediastinum/Nodes: Shotty mediastinal and hilar lymph nodes are all
subcentimeter short axis, likely reactive. Tiny hiatal hernia.
Patulous upper esophagus. No thyroid nodule.

Lungs/Pleura: Patchy multifocal ill-defined and nodular ground-glass
opacities within both lungs, pattern typical of IRWMJ-91 pneumonia.
Additional dependent atelectasis in the lower lobes. There are
bilateral calcified pleural plaques. No pleural fluid. Trachea and
central bronchi are patent.

Upper Abdomen: No acute or unexpected finding.  Tiny hiatal hernia.

Musculoskeletal: There are no acute or suspicious osseous
abnormalities. Multilevel degenerative change in the spine.

Review of the MIP images confirms the above findings.
IMPRESSION: 1. No pulmonary embolus.
2. Patchy multifocal ill-defined and nodular ground-glass opacities
within both lungs, pattern typical of IRWMJ-91 pneumonia.
Parenchymal involvement is mild.
3. A few scattered calcified pleural plaques consistent with prior
asbestos exposure.
4. Mild cardiomegaly. Coronary artery calcifications.

Aortic Atherosclerosis ([id number]).

## 2020-11-10 ENCOUNTER — Other Ambulatory Visit (INDEPENDENT_AMBULATORY_CARE_PROVIDER_SITE_OTHER): Payer: Self-pay | Admitting: Internal Medicine

## 2020-11-10 DIAGNOSIS — K219 Gastro-esophageal reflux disease without esophagitis: Secondary | ICD-10-CM

## 2020-11-14 ENCOUNTER — Other Ambulatory Visit (INDEPENDENT_AMBULATORY_CARE_PROVIDER_SITE_OTHER): Payer: Self-pay | Admitting: Nurse Practitioner

## 2020-11-14 DIAGNOSIS — E1151 Type 2 diabetes mellitus with diabetic peripheral angiopathy without gangrene: Secondary | ICD-10-CM

## 2020-12-01 DIAGNOSIS — Z85828 Personal history of other malignant neoplasm of skin: Secondary | ICD-10-CM | POA: Diagnosis not present

## 2020-12-01 DIAGNOSIS — Z1283 Encounter for screening for malignant neoplasm of skin: Secondary | ICD-10-CM | POA: Diagnosis not present

## 2020-12-01 DIAGNOSIS — L570 Actinic keratosis: Secondary | ICD-10-CM | POA: Diagnosis not present

## 2020-12-12 ENCOUNTER — Other Ambulatory Visit: Payer: Self-pay | Admitting: Internal Medicine

## 2020-12-17 ENCOUNTER — Ambulatory Visit (INDEPENDENT_AMBULATORY_CARE_PROVIDER_SITE_OTHER): Payer: Medicare Other

## 2020-12-17 ENCOUNTER — Ambulatory Visit (INDEPENDENT_AMBULATORY_CARE_PROVIDER_SITE_OTHER): Payer: Medicare Other | Admitting: Podiatry

## 2020-12-17 ENCOUNTER — Encounter: Payer: Self-pay | Admitting: Podiatry

## 2020-12-17 ENCOUNTER — Other Ambulatory Visit: Payer: Self-pay

## 2020-12-17 DIAGNOSIS — M79674 Pain in right toe(s): Secondary | ICD-10-CM

## 2020-12-17 DIAGNOSIS — E119 Type 2 diabetes mellitus without complications: Secondary | ICD-10-CM | POA: Diagnosis not present

## 2020-12-17 DIAGNOSIS — M79675 Pain in left toe(s): Secondary | ICD-10-CM

## 2020-12-17 DIAGNOSIS — E1151 Type 2 diabetes mellitus with diabetic peripheral angiopathy without gangrene: Secondary | ICD-10-CM | POA: Diagnosis not present

## 2020-12-17 DIAGNOSIS — E11621 Type 2 diabetes mellitus with foot ulcer: Secondary | ICD-10-CM

## 2020-12-17 DIAGNOSIS — L97511 Non-pressure chronic ulcer of other part of right foot limited to breakdown of skin: Secondary | ICD-10-CM

## 2020-12-17 DIAGNOSIS — L84 Corns and callosities: Secondary | ICD-10-CM | POA: Diagnosis not present

## 2020-12-17 DIAGNOSIS — B351 Tinea unguium: Secondary | ICD-10-CM

## 2020-12-17 DIAGNOSIS — E1142 Type 2 diabetes mellitus with diabetic polyneuropathy: Secondary | ICD-10-CM

## 2020-12-17 NOTE — Progress Notes (Addendum)
ANNUAL DIABETIC FOOT EXAM  Subjective: Marcus [name] presents today for for annual diabetic foot examination, at risk foot care. Pt has h/o NIDDM with PAD. callus(es) b/l feet and mycotic toenails that are difficult to trim. Painful toenails interfere with ambulation. Aggravating factors include wearing enclosed shoe gear. Pain is relieved with periodic professional debridement.  Patient also has h/o cyanosis of digits b/l feet as well as Guillain Barre' Syndrome.  Patient relates 3 year h/o diabetes.  Patient denies any prior h/o foot wounds.  Patient does have symptoms of foot numbness.  Patient's blood sugar was 131 mg/dl this morning.   Marcus Ards, NP is patient's PCP. Last visit was 04/22/2020. He also sees Dr. Doree Albee and last visit was 10/22/2020. Last ABIs, January, 2021.  Past Medical History:  Diagnosis Date  . AF (atrial fibrillation) ([place])   . Atrial flutter ([place])   . Blue toes    chronic  . Essential hypertension, benign 04/23/2019  . GERD (gastroesophageal reflux disease)   . Gout   . Guillain Barr syndrome ([place])   . Nuclear sclerotic cataract of left eye 05/07/2020  . Nuclear sclerotic cataract of right eye 05/07/2020  . Posterior subcapsular polar age-related cataract of left eye 05/07/2020  . Type II diabetes mellitus, uncontrolled ([place]) 04/23/2019   Patient Active Problem List   Diagnosis Date Noted  . Type 2 diabetes mellitus without complications ([place]) 00/71/2197  . Pseudophakia of both eyes 05/15/2020  . Posterior vitreous detachment of right eye 05/07/2020  . Posterior vitreous detachment of left eye 05/07/2020  . Weakness 03/14/2020  . COVID toes 03/14/2020  . AKI (acute kidney injury) ([place])   . Pneumonia due to COVID-19 virus 03/04/2020  . Acute metabolic encephalopathy 58/83/2549  . COVID-19 virus infection 03/03/2020  . History of colonic polyps 08/21/2019  . Peripheral vascular disease ([place]) 08/01/2019  . Type II  diabetes mellitus, uncontrolled ([place]) 04/23/2019  . Essential hypertension, benign 04/23/2019  . Toe cyanosis 11/17/2017  . C. difficile colitis 11/09/2017  . Diarrhea 11/07/2017  . Focal neurological deficit 07/21/2013  . GBS (Guillain Barre syndrome) ([place]) 07/21/2013  . Leg weakness 07/21/2013  . Femur fracture ([place]) 06/25/2013  . Femur fracture, right ([place]) 06/25/2013  . GOUT 07/31/2009  . AF (atrial fibrillation) ([place]) 07/31/2009  . Atrial flutter ([place]) 07/31/2009   Past Surgical History:  Procedure Laterality Date  . catheter ablation  12/23/2004  . COLONOSCOPY N/A 09/18/2014   Procedure: COLONOSCOPY;  Surgeon: Rogene Houston, MD;  [facility];  Service: Endoscopy;  Laterality: N/A;  830  . EYE SURGERY  2020   Bilateral cataract surgery  . FEMUR IM NAIL Right 06/25/2013   Procedure: INTRAMEDULLARY (IM) RETROGRADE FEMORAL NAILING;  Surgeon: Marybelle Killings, MD;  Location: [facility];  Service: Orthopedics;  Laterality: Right;   Current Outpatient Medications on File Prior to Visit  Medication Sig Dispense Refill  . albuterol (VENTOLIN HFA) 108 (90 Base) MCG/ACT inhaler Inhale 2 puffs into the lungs every 6 (six) hours as needed for wheezing or shortness of breath. 8 g 2  . aspirin EC 81 MG tablet Take 1 tablet (81 mg total) by mouth daily. 90 tablet 3  . glucose blood test strip Use to check your blood glucose once a day. 100 each 3  . metoprolol tartrate (LOPRESSOR) 25 MG tablet TAKE 3 TABLETS BY MOUTH TWICE DAILY. 180 tablet 3  . Multiple Vitamin (MULTIVITAMIN WITH MINERALS) TABS tablet Take 1 tablet  by mouth daily. 90 tablet 2  . pantoprazole (PROTONIX) 40 MG tablet TAKE 1 TABLET ONCE DAILY. 90 tablet 0  . pravastatin (PRAVACHOL) 20 MG tablet TAKE 1 TABLET BY MOUTH ONCE DAILY. 90 tablet 0  . vitamin B-12 (CYANOCOBALAMIN) 500 MCG tablet Take 1 tablet (500 mcg total) by mouth daily. 30 tablet 1  . glipiZIDE (GLUCOTROL) 5 MG tablet TAKE 1 TABLET ONCE DAILY. 30 tablet 3  .  metFORMIN (GLUCOPHAGE-XR) 500 MG 24 hr tablet TAKE (1) TABLET TWICE DAILY. 60 tablet 3   No current facility-administered medications on file prior to visit.    Allergies  Allergen Reactions  . Influenza Vaccines Other (See Comments)    Guillain-Barre syndrome   Social History   Occupational History  . Not on file  Tobacco Use  . Smoking status: Former Smoker    Packs/day: 2.00    Years: 5.00    Pack years: 10.00    Types: Cigarettes    Start date: 11/23/2016    Quit date: 11/24/2016    Years since quitting: 4.0  . Smokeless tobacco: Never Used  Vaping Use  . Vaping Use: Never used  Substance and Sexual Activity  . Alcohol use: No  . Drug use: No  . Sexual activity: Not on file   Family History  Problem Relation Age of Onset  . Diabetes Father   . Colon cancer Father   . Colon cancer Mother    Immunization History  Administered Date(s) Administered  . Influenza,inj,Quad PF,6+ Mos 06/26/2013  . Tdap 06/25/2013     Review of Systems: Negative except as noted in the HPI.  Objective: There were no vitals filed for this visit.  Marcus [name] is a pleasant 75 y.o. male in NAD. AAO X 3.  Vascular Examination: Capillary refill time to digits <4 seconds b/l lower extremities. Palpable DP pulse(s) left foot Palpable PT pulse(s) right foot. Nonpalpable DP pulse(s) right foot. Nonpalpable PT pulse(s) left foot. Pedal hair absent. Lower extremity skin temperature gradient warm to cool. Cyanosis of digits with feet in dependent position. Pallor with elevation b/l LE. No pain with calf compression b/l. No ischemia nor gangrene present.  Dermatological Examination: Pedal skin is thin shiny, atrophic submet head 1 left foot, submet head 1 right foot and submet head 5 left foot. No interdigital macerations bilaterally. Toenails 1-5 b/l elongated, discolored, dystrophic, thickened, crumbly with subungual debris and tenderness to dorsal palpation. Hyperkeratotic lesion(s)  submet head 1 left foot, submet head 1 right foot and submet head 5 left foot.  No erythema, no edema, no drainage, no fluctuance.    Wound Location: submet head 5 right foot. No edema, no erythema. Foot is insensate. There is a minimal amount of devitalized hyperkeratotic tissue. Predebridement Wound Measurement:  Hyperkeratotic lesion 0.3  x 0.3 cm with subdermal hemorrhage. Postdebridement Wound Measurement: 0.5 x 0.3 x 0.2 cm. Wound Base: Granular/Healthy Peri-wound: Normal Exudate: Scant/small amount Serous exudate Blood Loss during debridement: 0 cc('s). Material in wound which inhibits healing/promotes adjacent tissue breakdown:  nonviable hyperkeratosis. Description of tissue removed from ulceration today:  nonviable hyperkeratosis. Sign(s) of clinical bacterial infection: no clinical signs of infection noted on examination today.   Musculoskeletal Examination: Normal muscle strength 5/5 to all lower extremity muscle groups bilaterally. No pain crepitus or joint limitation noted with ROM b/l. Hallux valgus with bunion deformity noted b/l lower extremities. Hammertoe(s) noted to the 2-5 bilaterally.  Footwear Assessment: Does the patient wear appropriate shoes? Yes. Does the patient  need inserts/orthotics? Yes.  Neurological Examination: Protective sensation diminished with 10g monofilament b/l. Vibratory sensation decreased b/l. Clonus negative b/l.   Xray findings right foot: No gas in tissues right foot. No evidence of fracture right foot. No foreign body evident right foot. No bone erosion noted at location of ulceration submet head 5 right foot.  Hemoglobin A1C Latest Ref Rng & Units 10/08/2020 07/10/2020 03/03/2020  HGBA1C <5.7 % of total Hgb 6.4(H) 6.1(H) 7.3(H)  Some recent data might be hidden   Assessment: 1. Pain due to onychomycosis of toenails of both feet   2. Diabetic ulcer of right foot associated with type 2 diabetes mellitus, limited to breakdown of skin,  unspecified part of foot ([place])   3. Callus   4. Type II diabetes mellitus with peripheral circulatory disorder ([facility])   5. Encounter for diabetic foot exam ([place])     ADA Risk Categorization: High Risk  Patient has one or more of the following: Loss of protective sensation Absent pedal pulses Severe Foot deformity History of foot ulcer  Plan: -Diabetic foot examination performed today. -Patient was evaluated and treated and all questions answered.  -Patient/POA/Family member educated on diagnosis and treatment plan of routine ulcer debridement/wound care.  -Ulceration debridement achieved utilizing sharp excisional debridement with sterile scalpel blade. Type/amount of devitalized tissue removed: nonviable hyperkeratosis. -Today's ulcer size post-debridement: 0.5 x 0.3 x 0.2 cm. -Ulceration cleansed with wound cleanser. Iodosorb Gel applied to base of ulceration and secured with light dressing. -Wound responded well to today's debridement. -Patient risk factors affecting healing of ulcer: diabetes, PAD, foot deformity, neuropathy . -[name] given written instructions on daily wound care for right foot ulceration. -Dispensed Darco Shoe with Peg Assist system to offload ulceration right foot. Medicare ABN signed for Darco shoe. -Toenails 1-5 b/l were debrided in length and girth with sterile nail nippers and dremel without iatrogenic bleeding.  -Calluses pared submetatarsal head(s) 1 b/l and submet head 5 left utilizing sterile scalpel blade without incident. -Patient to report any pedal injuries to medical professional immediately. -Xray of right foot was performed and reviewed with patient and/or POA. -Patient/POA to call should there be question/concern in the interim.  Return in about 1 week (around 12/24/2020), with Dr. Boneta Lucks for diabetic ulcer right foot. He will follow up with me in 3 months for diabetic foot care.

## 2020-12-17 NOTE — Patient Instructions (Signed)
DRESSING CHANGES right foot:   PHARMACY SHOPPING LIST: 1. Saline or Wound Cleanser for cleaning wound 2. 2 x 2 inch sterile gauze for cleaning wound 3. Iodosorb Gel  A. IF DISPENSED, WEAR SURGICAL SHOE OR WALKING BOOT AT ALL TIMES.  B. IF DOCTOR HAS DESIGNATED NONWEIGHTBEARING STATUS, PLEASE ADHERE TO INSTRUCTIONS.   1. KEEP right foot DRY AT ALL TIMES!!!!  2. CLEANSE ULCER WITH SALINE OR WOUND CLEANSER.  3. DAB DRY WITH GAUZE SPONGE.  4. APPLY A LIGHT AMOUNT OF Iodosorb Gel TO BASE OF ULCER.  5. APPLY 2 inch Fabric Band-aid.  6. WEAR SURGICAL SHOE/BOOT DAILY AT ALL TIMES. IF SUPPLIED, WEAR HEEL PROTECTORS AT ALL TIMES WHEN IN BED.  7. DO NOT WALK BAREFOOT!!!  8.  IF YOU EXPERIENCE ANY FEVER, CHILLS, NIGHTSWEATS, NAUSEA OR VOMITING, ELEVATED OR LOW BLOOD SUGARS, REPORT TO EMERGENCY ROOM.  9. IF YOU EXPERIENCE INCREASED REDNESS, PAIN, SWELLING, DISCOLORATION, ODOR, PUS, DRAINAGE OR WARMTH OF YOUR FOOT, REPORT TO EMERGENCY ROOM.

## 2020-12-26 ENCOUNTER — Ambulatory Visit (INDEPENDENT_AMBULATORY_CARE_PROVIDER_SITE_OTHER): Payer: Medicare Other | Admitting: Podiatry

## 2020-12-26 ENCOUNTER — Other Ambulatory Visit: Payer: Self-pay

## 2020-12-26 DIAGNOSIS — E1142 Type 2 diabetes mellitus with diabetic polyneuropathy: Secondary | ICD-10-CM

## 2020-12-26 DIAGNOSIS — I7300 Raynaud's syndrome without gangrene: Secondary | ICD-10-CM | POA: Diagnosis not present

## 2020-12-26 DIAGNOSIS — E1351 Other specified diabetes mellitus with diabetic peripheral angiopathy without gangrene: Secondary | ICD-10-CM

## 2020-12-30 ENCOUNTER — Encounter: Payer: Self-pay | Admitting: Podiatry

## 2020-12-30 NOTE — Progress Notes (Signed)
Subjective:  Patient ID: Marcus [name], male    DOB: [birth year],  MRN: [id number]  Chief Complaint  Patient presents with  . Wound Check    Right foot below 5th toe     75 y.o. male presents with the above complaint.  Patient presents with complaint of bilateral Raynaud's phenomena with bluish discoloration to both feet.  Patient does not know if he has any vascular history.  Patient was primarily treated by Dr. Adah Perl for routine foot care.  He had an ulceration to the right fifth digit which now has healed as of today.  He denies any other acute complaints he has not seen any other specialist to get work-up done for vascular flow.  He does not have any history of any inflammatory diseases.   Review of Systems: Negative except as noted in the HPI. Denies N/V/F/Ch.  Past Medical History:  Diagnosis Date  . AF (atrial fibrillation) ([place])   . Atrial flutter ([place])   . Blue toes    chronic  . Essential hypertension, benign 04/23/2019  . GERD (gastroesophageal reflux disease)   . Gout   . Guillain Barr syndrome ([place])   . Nuclear sclerotic cataract of left eye 05/07/2020  . Nuclear sclerotic cataract of right eye 05/07/2020  . Posterior subcapsular polar age-related cataract of left eye 05/07/2020  . Type II diabetes mellitus, uncontrolled ([place]) 04/23/2019    Current Outpatient Medications:  .  albuterol (VENTOLIN HFA) 108 (90 Base) MCG/ACT inhaler, Inhale 2 puffs into the lungs every 6 (six) hours as needed for wheezing or shortness of breath., Disp: 8 g, Rfl: 2 .  aspirin EC 81 MG tablet, Take 1 tablet (81 mg total) by mouth daily., Disp: 90 tablet, Rfl: 3 .  glipiZIDE (GLUCOTROL) 5 MG tablet, TAKE 1 TABLET ONCE DAILY., Disp: 30 tablet, Rfl: 3 .  glucose blood test strip, Use to check your blood glucose once a day., Disp: 100 each, Rfl: 3 .  metFORMIN (GLUCOPHAGE-XR) 500 MG 24 hr tablet, TAKE (1) TABLET TWICE DAILY., Disp: 60 tablet, Rfl: 3 .  metoprolol tartrate  (LOPRESSOR) 25 MG tablet, TAKE 3 TABLETS BY MOUTH TWICE DAILY., Disp: 180 tablet, Rfl: 3 .  Multiple Vitamin (MULTIVITAMIN WITH MINERALS) TABS tablet, Take 1 tablet by mouth daily., Disp: 90 tablet, Rfl: 2 .  pantoprazole (PROTONIX) 40 MG tablet, TAKE 1 TABLET ONCE DAILY., Disp: 90 tablet, Rfl: 0 .  pravastatin (PRAVACHOL) 20 MG tablet, TAKE 1 TABLET BY MOUTH ONCE DAILY., Disp: 90 tablet, Rfl: 0 .  vitamin B-12 (CYANOCOBALAMIN) 500 MCG tablet, Take 1 tablet (500 mcg total) by mouth daily., Disp: 30 tablet, Rfl: 1  Social History   Tobacco Use  Smoking Status Former Smoker  . Packs/day: 2.00  . Years: 5.00  . Pack years: 10.00  . Types: Cigarettes  . Start date: 11/23/2016  . Quit date: 11/24/2016  . Years since quitting: 4.1  Smokeless Tobacco Never Used    Allergies  Allergen Reactions  . Influenza Vaccines Other (See Comments)    Guillain-Barre syndrome   Objective:  There were no vitals filed for this visit. There is no height or weight on file to calculate BMI. Constitutional Well developed. Well nourished.  Vascular Dorsalis pedis pulses nonpalpable bilaterally. Posterior tibial pulses non palpable bilaterally. Capillary refill normal to all digits.  No cyanosis or clubbing noted. Pedal hair growth none.  Bluish discoloration noted to both forefoot up to the midfoot.  Good cap refill noted.  No signs  of ulceration or open wounds noted  Neurologic Normal speech. Oriented to person, place, and time. Epicritic sensation to light touch grossly present bilaterally.  Dermatologic Nails well groomed and normal in appearance. No open wounds. No skin lesions.  Orthopedic:  Manual muscle testing 5 out of 5.   Radiographs: None Assessment:   1. Diabetic peripheral neuropathy associated with type 2 diabetes mellitus ([place])   2. Peripheral vascular disease due to secondary diabetes ([place])   3. Raynaud's phenomenon without gangrene    Plan:  Patient was evaluated and treated and  all questions answered.  Bilateral Raynaud's phenomenon versus peripheral vascular disease secondary to vascular abnormality -I explained to the patient the etiology of phenomenon and various treatment options were discussed.  Given the patient's ulceration also has healed up I believe this is likely due to Raynaud's phenomenon.  However patient will benefit from baseline ABIs PVRs as he has not had one done in the past 1.5 years.  Patient agrees with the plan. -If there is a decrease in circulation patient will benefit from vascular follow-up.  -If this is likely not a circulation related patient will need further work-up from her primary care physician for possible underlying etiology of Raynaud's. -ABIs PVRs were ordered  No follow-ups on file.

## 2021-01-01 ENCOUNTER — Ambulatory Visit (HOSPITAL_COMMUNITY)
Admission: RE | Admit: 2021-01-01 | Discharge: 2021-01-01 | Disposition: A | Discharge status: Home / Self Care | Payer: Medicare Other | Source: Ambulatory Visit | Attending: Podiatry | Admitting: Podiatry

## 2021-01-01 ENCOUNTER — Other Ambulatory Visit: Payer: Self-pay

## 2021-01-01 DIAGNOSIS — E1142 Type 2 diabetes mellitus with diabetic polyneuropathy: Secondary | ICD-10-CM | POA: Insufficient documentation

## 2021-01-01 DIAGNOSIS — I7300 Raynaud's syndrome without gangrene: Secondary | ICD-10-CM | POA: Diagnosis not present

## 2021-01-01 DIAGNOSIS — E1351 Other specified diabetes mellitus with diabetic peripheral angiopathy without gangrene: Secondary | ICD-10-CM | POA: Diagnosis not present

## 2021-01-22 ENCOUNTER — Other Ambulatory Visit (INDEPENDENT_AMBULATORY_CARE_PROVIDER_SITE_OTHER): Payer: Self-pay | Admitting: Internal Medicine

## 2021-01-23 ENCOUNTER — Ambulatory Visit (INDEPENDENT_AMBULATORY_CARE_PROVIDER_SITE_OTHER): Payer: Medicare Other | Admitting: Podiatry

## 2021-01-23 ENCOUNTER — Other Ambulatory Visit: Payer: Self-pay

## 2021-01-23 DIAGNOSIS — E1142 Type 2 diabetes mellitus with diabetic polyneuropathy: Secondary | ICD-10-CM

## 2021-01-23 DIAGNOSIS — I7300 Raynaud's syndrome without gangrene: Secondary | ICD-10-CM | POA: Diagnosis not present

## 2021-01-23 DIAGNOSIS — E1351 Other specified diabetes mellitus with diabetic peripheral angiopathy without gangrene: Secondary | ICD-10-CM | POA: Diagnosis not present

## 2021-01-27 ENCOUNTER — Encounter: Payer: Self-pay | Admitting: Podiatry

## 2021-01-27 NOTE — Progress Notes (Signed)
Patient in office today and seen by EJ with [facility]. Patient tried on diabetic shoes and was satisfied with the fit. Patient was educated on the break-in process at this time. Patient advised to call the office with any questions, comments, or concerns. Patient verbalized understanding.

## 2021-01-27 NOTE — Progress Notes (Signed)
Subjective:  Patient ID: Marcus [name], male    DOB: July 02, 1946,  MRN: [id number]  Chief Complaint  Patient presents with   Diabetic Ulcer    Wound check     75 y.o. male presents with the above complaint.  Patient presents with a follow-up of bilateral Raynaud's phenomenon with bluish discoloration of both feet.  He states that he is doing well.  The ulceration on the right side has healed.  He denies any other acute complaints.   Review of Systems: Negative except as noted in the HPI. Denies N/V/F/Ch.  Past Medical History:  Diagnosis Date   AF (atrial fibrillation) ([facility])    Atrial flutter ([facility])    Blue toes    chronic   Essential hypertension, benign 04/23/2019   GERD (gastroesophageal reflux disease)    Gout    Guillain Barr syndrome ([place])    Nuclear sclerotic cataract of left eye 05/07/2020   Nuclear sclerotic cataract of right eye 05/07/2020   Posterior subcapsular polar age-related cataract of left eye 05/07/2020   Type II diabetes mellitus, uncontrolled ([place]) 04/23/2019    Current Outpatient Medications:    albuterol (VENTOLIN HFA) 108 (90 Base) MCG/ACT inhaler, Inhale 2 puffs into the lungs every 6 (six) hours as needed for wheezing or shortness of breath., Disp: 8 g, Rfl: 2   aspirin EC 81 MG tablet, Take 1 tablet (81 mg total) by mouth daily., Disp: 90 tablet, Rfl: 3   glipiZIDE (GLUCOTROL) 5 MG tablet, Take 1 tablet (5 mg total) by mouth daily., Disp: 90 tablet, Rfl: 0   glucose blood test strip, Use to check your blood glucose once a day., Disp: 100 each, Rfl: 3   metFORMIN (GLUCOPHAGE-XR) 500 MG 24 hr tablet, TAKE (1) TABLET TWICE DAILY., Disp: 60 tablet, Rfl: 3   metoprolol tartrate (LOPRESSOR) 25 MG tablet, TAKE 3 TABLETS BY MOUTH TWICE DAILY., Disp: 180 tablet, Rfl: 3   Multiple Vitamin (MULTIVITAMIN WITH MINERALS) TABS tablet, Take 1 tablet by mouth daily., Disp: 90 tablet, Rfl: 2   pantoprazole (PROTONIX) 40 MG tablet, TAKE 1 TABLET ONCE DAILY.,  Disp: 90 tablet, Rfl: 0   pravastatin (PRAVACHOL) 20 MG tablet, TAKE 1 TABLET BY MOUTH ONCE DAILY., Disp: 90 tablet, Rfl: 0   vitamin B-12 (CYANOCOBALAMIN) 500 MCG tablet, Take 1 tablet (500 mcg total) by mouth daily., Disp: 30 tablet, Rfl: 1  Social History   Tobacco Use  Smoking Status Former   Packs/day: 2.00   Years: 5.00   Pack years: 10.00   Types: Cigarettes   Start date: 11/23/2016   Quit date: 11/24/2016   Years since quitting: 4.1  Smokeless Tobacco Never    Allergies  Allergen Reactions   Influenza Vaccines Other (See Comments)    Guillain-Barre syndrome   Objective:  There were no vitals filed for this visit. There is no height or weight on file to calculate BMI. Constitutional Well developed. Well nourished.  Vascular Dorsalis pedis pulses nonpalpable bilaterally. Posterior tibial pulses non palpable bilaterally. Capillary refill normal to all digits.  No cyanosis or clubbing noted. Pedal hair growth none.  Bluish discoloration noted to both forefoot up to the midfoot.  Good cap refill noted.  No signs of ulceration or open wounds noted  Neurologic Normal speech. Oriented to person, place, and time. Epicritic sensation to light touch grossly present bilaterally.  Dermatologic Nails well groomed and normal in appearance. No open wounds. No skin lesions.  Orthopedic:  Manual muscle testing 5 out of  5.   Radiographs: None Assessment:   1. Diabetic peripheral neuropathy associated with type 2 diabetes mellitus ([place])   2. Peripheral vascular disease due to secondary diabetes ([place])   3. Raynaud's phenomenon without gangrene     Plan:  Patient was evaluated and treated and all questions answered.  Bilateral Raynaud's phenomenon versus peripheral vascular disease secondary to vascular abnormality -ABIs were reviewed with the patient which showed that they were normal.  At this time that the vascular discoloration is likely due to Raynaud's phenomenon.  I  discussed with her that he can follow-up with the primary care physician who can do further work-up/follow-up with a rheumatologist.  He states understanding -I also discussed shoe gear modification with the patient in extensive detail he states understanding.  No follow-ups on file.

## 2021-02-09 ENCOUNTER — Encounter: Payer: Self-pay | Admitting: Internal Medicine

## 2021-02-09 DIAGNOSIS — E119 Type 2 diabetes mellitus without complications: Secondary | ICD-10-CM | POA: Diagnosis not present

## 2021-02-09 DIAGNOSIS — H35362 Drusen (degenerative) of macula, left eye: Secondary | ICD-10-CM | POA: Diagnosis not present

## 2021-02-09 DIAGNOSIS — Z961 Presence of intraocular lens: Secondary | ICD-10-CM | POA: Diagnosis not present

## 2021-02-09 DIAGNOSIS — H26493 Other secondary cataract, bilateral: Secondary | ICD-10-CM | POA: Diagnosis not present

## 2021-02-09 DIAGNOSIS — H35033 Hypertensive retinopathy, bilateral: Secondary | ICD-10-CM | POA: Diagnosis not present

## 2021-02-09 LAB — HM DIABETES EYE EXAM

## 2021-02-11 ENCOUNTER — Other Ambulatory Visit (INDEPENDENT_AMBULATORY_CARE_PROVIDER_SITE_OTHER): Payer: Self-pay | Admitting: Internal Medicine

## 2021-02-11 DIAGNOSIS — K219 Gastro-esophageal reflux disease without esophagitis: Secondary | ICD-10-CM

## 2021-02-12 ENCOUNTER — Other Ambulatory Visit: Payer: Self-pay

## 2021-02-12 ENCOUNTER — Encounter (INDEPENDENT_AMBULATORY_CARE_PROVIDER_SITE_OTHER): Payer: Self-pay | Admitting: Internal Medicine

## 2021-02-12 ENCOUNTER — Ambulatory Visit (INDEPENDENT_AMBULATORY_CARE_PROVIDER_SITE_OTHER): Payer: Medicare Other | Admitting: Internal Medicine

## 2021-02-12 VITALS — BP 118/80 | HR 65 | Temp 96.8°F | Ht 72.0 in | Wt 177.6 lb

## 2021-02-12 DIAGNOSIS — E785 Hyperlipidemia, unspecified: Secondary | ICD-10-CM | POA: Diagnosis not present

## 2021-02-12 DIAGNOSIS — I10 Essential (primary) hypertension: Secondary | ICD-10-CM

## 2021-02-12 DIAGNOSIS — E1140 Type 2 diabetes mellitus with diabetic neuropathy, unspecified: Secondary | ICD-10-CM

## 2021-02-12 DIAGNOSIS — E875 Hyperkalemia: Secondary | ICD-10-CM | POA: Diagnosis not present

## 2021-02-12 NOTE — Progress Notes (Signed)
Metrics: Intervention Frequency ACO  Documented Smoking Status Yearly  Screened one or more times in 24 months  Cessation Counseling or  Active cessation medication Past 24 months  Past 24 months   Guideline developer: UpToDate (See UpToDate for funding source) Date Released: 2014       Wellness Office Visit  Subjective:  Patient ID: Marcus [name], male    DOB: 12/21/1945  Age: 75 y.o. MRN: [id number]  CC: This man comes in for follow-up of type 2 diabetes, hypertension, hyperlipidemia. HPI  He is doing reasonably well and has no significant place. He continues to take glipizide for his diabetes. He continues to take statin for his hyperlipidemia in the face of diabetes.  He continues on metoprolol for hypertension. Past Medical History:  Diagnosis Date   AF (atrial fibrillation) ([facility])    Atrial flutter ([facility])    Blue toes    chronic   Essential hypertension, benign 04/23/2019   GERD (gastroesophageal reflux disease)    Gout    Guillain Barr syndrome ([place])    Nuclear sclerotic cataract of left eye 05/07/2020   Nuclear sclerotic cataract of right eye 05/07/2020   Posterior subcapsular polar age-related cataract of left eye 05/07/2020   Type II diabetes mellitus, uncontrolled ([place]) 04/23/2019   Past Surgical History:  Procedure Laterality Date   catheter ablation  12/23/2004   COLONOSCOPY N/A 09/18/2014   Procedure: COLONOSCOPY;  Surgeon: Rogene Houston, MD;  [facility];  Service: Endoscopy;  Laterality: N/A;  830   EYE SURGERY  2020   Bilateral cataract surgery   FEMUR IM NAIL Right 06/25/2013   Procedure: INTRAMEDULLARY (IM) RETROGRADE FEMORAL NAILING;  Surgeon: Marybelle Killings, MD;  Location: [facility];  Service: Orthopedics;  Laterality: Right;     Family History  Problem Relation Age of Onset   Diabetes Father    Colon cancer Father    Colon cancer Mother     Social History   Social History Narrative   Married for 87 years,lives with  wife.Retired.   Social History   Tobacco Use   Smoking status: Former    Packs/day: 2.00    Years: 5.00    Pack years: 10.00    Types: Cigarettes    Start date: 11/23/2016    Quit date: 11/24/2016    Years since quitting: 4.2   Smokeless tobacco: Never  Substance Use Topics   Alcohol use: No    Current Meds  Medication Sig   albuterol (VENTOLIN HFA) 108 (90 Base) MCG/ACT inhaler Inhale 2 puffs into the lungs every 6 (six) hours as needed for wheezing or shortness of breath.   aspirin EC 81 MG tablet Take 1 tablet (81 mg total) by mouth daily.   glipiZIDE (GLUCOTROL) 5 MG tablet Take 1 tablet (5 mg total) by mouth daily.   glucose blood test strip Use to check your blood glucose once a day.   metoprolol tartrate (LOPRESSOR) 25 MG tablet TAKE 3 TABLETS BY MOUTH TWICE DAILY.   Multiple Vitamin (MULTIVITAMIN WITH MINERALS) TABS tablet Take 1 tablet by mouth daily.   pantoprazole (PROTONIX) 40 MG tablet TAKE 1 TABLET ONCE DAILY.   pravastatin (PRAVACHOL) 20 MG tablet TAKE 1 TABLET BY MOUTH ONCE DAILY.   vitamin B-12 (CYANOCOBALAMIN) 500 MCG tablet Take 1 tablet (500 mcg total) by mouth daily.     [facility] Office Visit from 02/12/2021 in [facility]  PHQ-9 Total Score 0  Objective:   Today's Vitals: BP 118/80   Pulse 65   Temp (!) 96.8 F (36 C) (Temporal)   Ht 6' (1.829 m)   Wt 177 lb 9.6 oz (80.6 kg)   SpO2 99%   BMI 24.09 kg/m  Vitals with BMI 02/12/2021 10/22/2020 10/08/2020  Height 6' 0"  6' 0"  6' 0"   Weight 177 lbs 10 oz 177 lbs 178 lbs 6 oz  BMI 61.95 24 09.32  Systolic 671 245 809  Diastolic 80 80 72  Pulse 65 82 (No Data)     Physical Exam  He looks systemically well.  Weight is stable.  Blood pressure is well controlled.  He is alert and orientated without any focal logical signs.     Assessment   1. Essential hypertension, benign   2. Type 2 diabetes mellitus with diabetic neuropathy, without long-term current use of insulin ([place])    3. Hyperkalemia   4. Hyperlipidemia, unspecified hyperlipidemia type       Tests ordered Orders Placed This Encounter  Procedures   COMPLETE METABOLIC PANEL WITH GFR   Hemoglobin A1c   Lipid panel      Plan: 1.  Continue with metoprolol for hypertension. 2.  Continue with glipizide for diabetes and we will check an A1c today. 3.  Continue with statin therapy and we will check a lipid panel. 4.  Follow-up with Judson Roch in December for an annual Medicare wellness visit.    No orders of the defined types were placed in this encounter.   Doree Albee, MD

## 2021-02-13 ENCOUNTER — Other Ambulatory Visit (INDEPENDENT_AMBULATORY_CARE_PROVIDER_SITE_OTHER): Payer: Self-pay | Admitting: Internal Medicine

## 2021-02-13 DIAGNOSIS — E1151 Type 2 diabetes mellitus with diabetic peripheral angiopathy without gangrene: Secondary | ICD-10-CM

## 2021-02-13 LAB — COMPLETE METABOLIC PANEL WITH GFR
AG Ratio: 2.3 (calc) (ref 1.0–2.5)
ALT: 11 U/L (ref 9–46)
AST: 13 U/L (ref 10–35)
Albumin: 4.4 g/dL (ref 3.6–5.1)
Alkaline phosphatase (APISO): 84 U/L (ref 35–144)
BUN/Creatinine Ratio: 20 (calc) (ref 6–22)
BUN: 28 mg/dL — ABNORMAL HIGH (ref 7–25)
CO2: 27 mmol/L (ref 20–32)
Calcium: 9.8 mg/dL (ref 8.6–10.3)
Chloride: 104 mmol/L (ref 98–110)
Creat: 1.43 mg/dL — ABNORMAL HIGH (ref 0.70–1.28)
Globulin: 1.9 g/dL (calc) (ref 1.9–3.7)
Glucose, Bld: 208 mg/dL — ABNORMAL HIGH (ref 65–139)
Potassium: 5.4 mmol/L — ABNORMAL HIGH (ref 3.5–5.3)
Sodium: 141 mmol/L (ref 135–146)
Total Bilirubin: 0.7 mg/dL (ref 0.2–1.2)
Total Protein: 6.3 g/dL (ref 6.1–8.1)
eGFR: 50 mL/min/{1.73_m2} — ABNORMAL LOW (ref >=60)

## 2021-02-13 LAB — LIPID PANEL
Cholesterol: 131 mg/dL (ref <200)
HDL: 38 mg/dL — ABNORMAL LOW (ref >=40)
LDL Cholesterol (Calc): 67 mg/dL (calc)
Non-HDL Cholesterol (Calc): 93 mg/dL (calc) (ref <130)
Total CHOL/HDL Ratio: 3.4 (calc) (ref <5.0)
Triglycerides: 180 mg/dL — ABNORMAL HIGH (ref <150)

## 2021-02-13 LAB — HEMOGLOBIN A1C
Hgb A1c MFr Bld: 6.6 % of total Hgb — ABNORMAL HIGH (ref <5.7)
Mean Plasma Glucose: 143 mg/dL
eAG (mmol/L): 7.9 mmol/L

## 2021-02-14 ENCOUNTER — Other Ambulatory Visit (INDEPENDENT_AMBULATORY_CARE_PROVIDER_SITE_OTHER): Payer: Self-pay | Admitting: Internal Medicine

## 2021-02-14 DIAGNOSIS — N189 Chronic kidney disease, unspecified: Secondary | ICD-10-CM

## 2021-02-25 ENCOUNTER — Other Ambulatory Visit (INDEPENDENT_AMBULATORY_CARE_PROVIDER_SITE_OTHER): Payer: Self-pay | Admitting: Internal Medicine

## 2021-03-16 ENCOUNTER — Other Ambulatory Visit: Payer: Self-pay | Admitting: Internal Medicine

## 2021-03-24 ENCOUNTER — Other Ambulatory Visit (INDEPENDENT_AMBULATORY_CARE_PROVIDER_SITE_OTHER): Payer: Self-pay | Admitting: Nurse Practitioner

## 2021-03-25 ENCOUNTER — Ambulatory Visit (INDEPENDENT_AMBULATORY_CARE_PROVIDER_SITE_OTHER): Payer: Medicare Other | Admitting: Podiatry

## 2021-03-25 ENCOUNTER — Other Ambulatory Visit: Payer: Self-pay

## 2021-03-25 ENCOUNTER — Encounter: Payer: Self-pay | Admitting: Podiatry

## 2021-03-25 DIAGNOSIS — M79674 Pain in right toe(s): Secondary | ICD-10-CM

## 2021-03-25 DIAGNOSIS — M79675 Pain in left toe(s): Secondary | ICD-10-CM

## 2021-03-25 DIAGNOSIS — L84 Corns and callosities: Secondary | ICD-10-CM

## 2021-03-25 DIAGNOSIS — B351 Tinea unguium: Secondary | ICD-10-CM

## 2021-03-25 DIAGNOSIS — E1142 Type 2 diabetes mellitus with diabetic polyneuropathy: Secondary | ICD-10-CM | POA: Diagnosis not present

## 2021-03-25 DIAGNOSIS — I739 Peripheral vascular disease, unspecified: Secondary | ICD-10-CM | POA: Diagnosis not present

## 2021-03-30 NOTE — Progress Notes (Signed)
  Subjective:  Patient ID: Marcus [name], male    DOB: 03-29-1946,  MRN: [id number]  75 y.o. male presents with for at risk foot care. Patient has h/o PAD and callus(es) of both feet and painful thick toenails that are difficult to trim. Painful toenails interfere with ambulation. Aggravating factors include wearing enclosed shoe gear. Pain is relieved with periodic professional debridement. Painful calluses are aggravated when weightbearing with and without shoegear. Pain is relieved with periodic professional debridement..    He states ulcer on plantar aspect of right foot has healed.  Patient's blood sugar was 126 mg/dl today.   PCP:  He saw Dr. Hurshel Party on 10/22/2020. Dr. Anastasio Champion recently passed away. Mr. Hamman is looking for a new PCP.  Review of Systems: Negative except as noted in the HPI.   Allergies  Allergen Reactions   Influenza Vaccines Other (See Comments)    Guillain-Barre syndrome    Objective:  There were no vitals filed for this visit. Constitutional Patient is a pleasant 75 y.o. Caucasian male WD, WN in NAD. AAO x 3.  Vascular Capillary fill time to digits <4seconds b/l lower extremities. Palpable DP pulse left foot; nonpalpable DP pulse right foot. Palpable PT pulse right foot; nonpalpable PT pulse left foot. Pedal hair absent. Lower extremity skin temperature gradient warm to cool b/l. No pain with calf compression b/l. Trace edema noted b/l lower extremities. Cyanosis of digits when feet are in dependent position; pallor with elevation of LE.   Neurologic Normal speech. Protective sensation diminished bilaterally with 10g monofilament b/l. Vibratory sensation decreased b/l.  Dermatologic Pedal skin is thin shiny, atrophic b/l lower extremities. No open wounds b/l lower extremities. No interdigital macerations b/l lower extremities. Toenails 1-5 b/l elongated, discolored, dystrophic, thickened, crumbly with subungual debris and tenderness to dorsal  palpation. Hyperkeratotic lesion(s) submet head 1 left foot, submet head 1 right foot, and submet head 5 left foot with tenderness to palpation. No edema, no erythema, no drainage, no fluctuance.   Orthopedic: Normal muscle strength 5/5 to all lower extremity muscle groups bilaterally. Patient ambulates independent of any assistive aids.   Hemoglobin A1C Latest Ref Rng & Units 02/12/2021 10/08/2020 07/10/2020  HGBA1C <5.7 % of total Hgb 6.6(H) 6.4(H) 6.1(H)  Some recent data might be hidden   Assessment:   1. Pain due to onychomycosis of toenails of both feet   2. Callus   3. PAD (peripheral artery disease) ([place])   4. Diabetic peripheral neuropathy associated with type 2 diabetes mellitus ([place])    Plan:  Patient was evaluated and treated and all questions answered. Consent given for treatment as described below: -Examined patient. -Continue diabetic foot care principles: inspect feet daily, monitor glucose as recommended by PCP and/or Endocrinologist, and follow prescribed diet per PCP, Endocrinologist and/or dietician. -Patient to continue soft, supportive shoe gear daily. -Toenails 1-5 b/l were debrided in length and girth with sterile nail nippers and dremel without iatrogenic bleeding.  -Callus(es) submet head 1 left foot, submet head 1 right foot, and submet head 5 left foot pared utilizing sterile scalpel blade without complication or incident. Total number debrided =3. -Patient to report any pedal injuries to medical professional immediately. -Patient/POA to call should there be question/concern in the interim.  Return in about 9 weeks (around 05/27/2021).  Marzetta Board, DPM

## 2021-04-01 DIAGNOSIS — N179 Acute kidney failure, unspecified: Secondary | ICD-10-CM | POA: Insufficient documentation

## 2021-04-07 DIAGNOSIS — E1142 Type 2 diabetes mellitus with diabetic polyneuropathy: Secondary | ICD-10-CM | POA: Diagnosis not present

## 2021-04-07 DIAGNOSIS — Z8669 Personal history of other diseases of the nervous system and sense organs: Secondary | ICD-10-CM | POA: Diagnosis not present

## 2021-04-07 DIAGNOSIS — I10 Essential (primary) hypertension: Secondary | ICD-10-CM | POA: Diagnosis not present

## 2021-04-07 DIAGNOSIS — N189 Chronic kidney disease, unspecified: Secondary | ICD-10-CM | POA: Diagnosis not present

## 2021-04-07 DIAGNOSIS — G620 Drug-induced polyneuropathy: Secondary | ICD-10-CM | POA: Diagnosis not present

## 2021-04-07 DIAGNOSIS — R319 Hematuria, unspecified: Secondary | ICD-10-CM | POA: Diagnosis not present

## 2021-04-07 DIAGNOSIS — E875 Hyperkalemia: Secondary | ICD-10-CM | POA: Diagnosis not present

## 2021-04-07 DIAGNOSIS — N200 Calculus of kidney: Secondary | ICD-10-CM | POA: Diagnosis not present

## 2021-04-07 DIAGNOSIS — Z9229 Personal history of other drug therapy: Secondary | ICD-10-CM | POA: Diagnosis not present

## 2021-04-07 DIAGNOSIS — I129 Hypertensive chronic kidney disease with stage 1 through stage 4 chronic kidney disease, or unspecified chronic kidney disease: Secondary | ICD-10-CM | POA: Diagnosis not present

## 2021-04-07 DIAGNOSIS — K219 Gastro-esophageal reflux disease without esophagitis: Secondary | ICD-10-CM | POA: Diagnosis not present

## 2021-04-07 DIAGNOSIS — E1122 Type 2 diabetes mellitus with diabetic chronic kidney disease: Secondary | ICD-10-CM | POA: Diagnosis not present

## 2021-04-07 DIAGNOSIS — I4891 Unspecified atrial fibrillation: Secondary | ICD-10-CM | POA: Diagnosis not present

## 2021-04-07 DIAGNOSIS — Z79899 Other long term (current) drug therapy: Secondary | ICD-10-CM | POA: Diagnosis not present

## 2021-04-07 DIAGNOSIS — E785 Hyperlipidemia, unspecified: Secondary | ICD-10-CM | POA: Diagnosis not present

## 2021-04-09 DIAGNOSIS — I129 Hypertensive chronic kidney disease with stage 1 through stage 4 chronic kidney disease, or unspecified chronic kidney disease: Secondary | ICD-10-CM | POA: Diagnosis not present

## 2021-04-09 DIAGNOSIS — E875 Hyperkalemia: Secondary | ICD-10-CM | POA: Diagnosis not present

## 2021-04-09 DIAGNOSIS — Z79899 Other long term (current) drug therapy: Secondary | ICD-10-CM | POA: Diagnosis not present

## 2021-04-09 DIAGNOSIS — E1122 Type 2 diabetes mellitus with diabetic chronic kidney disease: Secondary | ICD-10-CM | POA: Diagnosis not present

## 2021-04-09 DIAGNOSIS — N189 Chronic kidney disease, unspecified: Secondary | ICD-10-CM | POA: Diagnosis not present

## 2021-04-09 DIAGNOSIS — R319 Hematuria, unspecified: Secondary | ICD-10-CM | POA: Diagnosis not present

## 2021-04-09 DIAGNOSIS — E559 Vitamin D deficiency, unspecified: Secondary | ICD-10-CM | POA: Diagnosis not present

## 2021-04-09 DIAGNOSIS — N200 Calculus of kidney: Secondary | ICD-10-CM | POA: Diagnosis not present

## 2021-04-15 ENCOUNTER — Other Ambulatory Visit (HOSPITAL_BASED_OUTPATIENT_CLINIC_OR_DEPARTMENT_OTHER): Payer: Self-pay | Admitting: Nephrology

## 2021-04-15 ENCOUNTER — Other Ambulatory Visit (HOSPITAL_COMMUNITY): Payer: Self-pay | Admitting: Nephrology

## 2021-04-15 DIAGNOSIS — E1122 Type 2 diabetes mellitus with diabetic chronic kidney disease: Secondary | ICD-10-CM

## 2021-04-15 DIAGNOSIS — I129 Hypertensive chronic kidney disease with stage 1 through stage 4 chronic kidney disease, or unspecified chronic kidney disease: Secondary | ICD-10-CM

## 2021-04-15 DIAGNOSIS — N200 Calculus of kidney: Secondary | ICD-10-CM

## 2021-04-15 DIAGNOSIS — Z79899 Other long term (current) drug therapy: Secondary | ICD-10-CM

## 2021-04-15 DIAGNOSIS — R319 Hematuria, unspecified: Secondary | ICD-10-CM

## 2021-04-15 DIAGNOSIS — E875 Hyperkalemia: Secondary | ICD-10-CM

## 2021-04-23 ENCOUNTER — Other Ambulatory Visit: Payer: Self-pay

## 2021-04-23 ENCOUNTER — Ambulatory Visit (HOSPITAL_COMMUNITY)
Admission: RE | Admit: 2021-04-23 | Discharge: 2021-04-23 | Disposition: A | Discharge status: Home / Self Care | Payer: Medicare Other | Source: Ambulatory Visit | Attending: Nephrology | Admitting: Nephrology

## 2021-04-23 DIAGNOSIS — Z79899 Other long term (current) drug therapy: Secondary | ICD-10-CM | POA: Insufficient documentation

## 2021-04-23 DIAGNOSIS — N200 Calculus of kidney: Secondary | ICD-10-CM | POA: Diagnosis not present

## 2021-04-23 DIAGNOSIS — R319 Hematuria, unspecified: Secondary | ICD-10-CM | POA: Insufficient documentation

## 2021-04-23 DIAGNOSIS — I129 Hypertensive chronic kidney disease with stage 1 through stage 4 chronic kidney disease, or unspecified chronic kidney disease: Secondary | ICD-10-CM | POA: Insufficient documentation

## 2021-04-23 DIAGNOSIS — E1122 Type 2 diabetes mellitus with diabetic chronic kidney disease: Secondary | ICD-10-CM | POA: Diagnosis not present

## 2021-04-23 DIAGNOSIS — E875 Hyperkalemia: Secondary | ICD-10-CM | POA: Insufficient documentation

## 2021-04-23 DIAGNOSIS — N189 Chronic kidney disease, unspecified: Secondary | ICD-10-CM | POA: Diagnosis not present

## 2021-05-06 DIAGNOSIS — N189 Chronic kidney disease, unspecified: Secondary | ICD-10-CM | POA: Diagnosis not present

## 2021-05-06 DIAGNOSIS — R3129 Other microscopic hematuria: Secondary | ICD-10-CM | POA: Diagnosis not present

## 2021-05-06 DIAGNOSIS — E875 Hyperkalemia: Secondary | ICD-10-CM | POA: Diagnosis not present

## 2021-05-06 DIAGNOSIS — I129 Hypertensive chronic kidney disease with stage 1 through stage 4 chronic kidney disease, or unspecified chronic kidney disease: Secondary | ICD-10-CM | POA: Diagnosis not present

## 2021-05-06 DIAGNOSIS — E1122 Type 2 diabetes mellitus with diabetic chronic kidney disease: Secondary | ICD-10-CM | POA: Diagnosis not present

## 2021-05-06 DIAGNOSIS — N200 Calculus of kidney: Secondary | ICD-10-CM | POA: Diagnosis not present

## 2021-05-27 ENCOUNTER — Other Ambulatory Visit: Payer: Self-pay

## 2021-05-27 ENCOUNTER — Encounter: Payer: Self-pay | Admitting: Podiatry

## 2021-05-27 ENCOUNTER — Ambulatory Visit (INDEPENDENT_AMBULATORY_CARE_PROVIDER_SITE_OTHER): Payer: Medicare Other | Admitting: Podiatry

## 2021-05-27 DIAGNOSIS — L03031 Cellulitis of right toe: Secondary | ICD-10-CM

## 2021-05-27 DIAGNOSIS — E1142 Type 2 diabetes mellitus with diabetic polyneuropathy: Secondary | ICD-10-CM

## 2021-05-27 DIAGNOSIS — G610 Guillain-Barre syndrome: Secondary | ICD-10-CM

## 2021-05-27 DIAGNOSIS — I739 Peripheral vascular disease, unspecified: Secondary | ICD-10-CM | POA: Diagnosis not present

## 2021-05-27 DIAGNOSIS — L84 Corns and callosities: Secondary | ICD-10-CM | POA: Diagnosis not present

## 2021-05-27 DIAGNOSIS — B351 Tinea unguium: Secondary | ICD-10-CM | POA: Diagnosis not present

## 2021-05-27 MED ORDER — DOXYCYCLINE HYCLATE 100 MG PO CAPS: 100.0000 mg | ORAL_CAPSULE | Freq: Two times a day (BID) | ORAL | 0 refills | Status: DC

## 2021-05-27 NOTE — Patient Instructions (Signed)

## 2021-05-28 NOTE — Progress Notes (Signed)
Subjective:  Patient ID: Marcus [name], male    DOB: 12/24/1945,  MRN: [id number]  75 y.o. male presents with for at risk foot care. Patient has h/o PAD and callus(es) of both feet and painful thick toenails that are difficult to trim. Painful toenails interfere with ambulation. Aggravating factors include wearing enclosed shoe gear. Pain is relieved with periodic professional debridement. Painful calluses are aggravated when weightbearing with and without shoegear. Pain is relieved with periodic professional debridement.  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  He states he may have stubbed his toe since his last visit. He does not feel pain due to Guillain Barre' Syndrome.  Patient's blood sugar was 117 mg/dl today.    PCP: Liana Gerold, MD and last visit was: one month ago.  Review of Systems: Negative except as noted in the HPI.   Allergies  Allergen Reactions   Influenza Vaccines Other (See Comments)    Guillain-Barre syndrome Other reaction(s): Other (see comments) Guillain-Barre syndrome    Objective:  There were no vitals filed for this visit. Constitutional Patient is a pleasant 75 y.o. Caucasian male WD, WN in NAD. AAO x 3.  Vascular Capillary fill time to digits <3 seconds b/l lower extremities. Palpable DP pulse(s) LLE. Nonpalpable DP pulse RLE. Nonpalpable PT pulse(s) LLE; palpable PT pulse RLE. Pedal hair absent. Lower extremity skin temperature gradient warm to cool b/l. No pain with calf compression b/l. Cyanosis of digits in dependent position b/l.  Neurologic Protective sensation diminished with 10g monofilament b/l. Vibratory sensation decreased b/l.  Dermatologic Pedal skin is thin shiny, atrophic b/l lower extremities. No open wounds b/l lower extremities. No interdigital macerations b/l lower extremities. He does have mild edema with erythema at proximal nailfold of right hallux. No abscess nor ingrown  toenail appreciated.Toenails 1-5 b/l elongated, discolored, dystrophic, thickened, crumbly with subungual debris and tenderness to dorsal palpation. Hyperkeratotic lesion(s) submet head 1 b/l and submet head 5 left foot.  No erythema, no edema, no drainage, no fluctuance.  Orthopedic: Normal muscle strength 5/5 to all lower extremity muscle groups bilaterally. Normal muscle strength 5/5 to all lower extremity muscle groups bilaterally. HAV with bunion deformity noted b/l LE.   Hemoglobin A1C Latest Ref Rng & Units 02/12/2021 10/08/2020 07/10/2020  HGBA1C <5.7 % of total Hgb 6.6(H) 6.4(H) 6.1(H)  Some recent data might be hidden    Assessment:   1. Onychomycosis   2. Cellulitis of great toe, right   3. Callus   4. PAD (peripheral artery disease) ([place])   5. GBS (Guillain Barre syndrome) ([place])   6. Diabetic peripheral neuropathy associated with type 2 diabetes mellitus ([place])    Plan:  Patient was evaluated and treated and all questions answered. Consent given for treatment as described below: -Examined patient. -Discussed cellulitis right hallux. No wound or abscess. Will cover him empirically with doxycycline 100 mg po bid x 7 days. Dispensed printed information on condition. He will call if he has any problems. -Callus(es) submet head 1 b/l and submet head 5 left foot pared utilizing sterile scalpel blade without complication or incident. Total number debrided =3. --Toenails 1-5 left and 1-5 right debrided in length and girth with sterile nail nippers and dremel tool without incident. -Continue soft, supportive shoe gear daily -Report any pedal problems to medical professional immediately. -Patient/POA to contact office if there are any problems in the interim.  FOLLOW UP:  Return in about 3 months (around 08/27/2021).  Marzetta Board, DPM

## 2021-06-04 ENCOUNTER — Ambulatory Visit (INDEPENDENT_AMBULATORY_CARE_PROVIDER_SITE_OTHER): Payer: Medicare Other | Admitting: Nurse Practitioner

## 2021-06-08 DIAGNOSIS — Z85828 Personal history of other malignant neoplasm of skin: Secondary | ICD-10-CM | POA: Diagnosis not present

## 2021-06-08 DIAGNOSIS — Z1283 Encounter for screening for malignant neoplasm of skin: Secondary | ICD-10-CM | POA: Diagnosis not present

## 2021-06-08 DIAGNOSIS — L570 Actinic keratosis: Secondary | ICD-10-CM | POA: Diagnosis not present

## 2021-07-01 ENCOUNTER — Encounter (INDEPENDENT_AMBULATORY_CARE_PROVIDER_SITE_OTHER): Payer: Medicare Other | Admitting: Nurse Practitioner

## 2021-07-09 DIAGNOSIS — I10 Essential (primary) hypertension: Secondary | ICD-10-CM | POA: Diagnosis not present

## 2021-07-09 DIAGNOSIS — I83019 Varicose veins of right lower extremity with ulcer of unspecified site: Secondary | ICD-10-CM | POA: Diagnosis not present

## 2021-07-09 DIAGNOSIS — R230 Cyanosis: Secondary | ICD-10-CM | POA: Diagnosis not present

## 2021-07-09 DIAGNOSIS — L97919 Non-pressure chronic ulcer of unspecified part of right lower leg with unspecified severity: Secondary | ICD-10-CM | POA: Diagnosis not present

## 2021-07-09 DIAGNOSIS — E1142 Type 2 diabetes mellitus with diabetic polyneuropathy: Secondary | ICD-10-CM | POA: Diagnosis not present

## 2021-07-09 DIAGNOSIS — K219 Gastro-esophageal reflux disease without esophagitis: Secondary | ICD-10-CM | POA: Diagnosis not present

## 2021-07-09 DIAGNOSIS — E785 Hyperlipidemia, unspecified: Secondary | ICD-10-CM | POA: Diagnosis not present

## 2021-07-13 DIAGNOSIS — R230 Cyanosis: Secondary | ICD-10-CM | POA: Diagnosis not present

## 2021-07-13 DIAGNOSIS — R948 Abnormal results of function studies of other organs and systems: Secondary | ICD-10-CM | POA: Diagnosis not present

## 2021-07-17 DIAGNOSIS — I129 Hypertensive chronic kidney disease with stage 1 through stage 4 chronic kidney disease, or unspecified chronic kidney disease: Secondary | ICD-10-CM | POA: Diagnosis not present

## 2021-07-17 DIAGNOSIS — E875 Hyperkalemia: Secondary | ICD-10-CM | POA: Diagnosis not present

## 2021-07-17 DIAGNOSIS — R3129 Other microscopic hematuria: Secondary | ICD-10-CM | POA: Diagnosis not present

## 2021-07-17 DIAGNOSIS — N189 Chronic kidney disease, unspecified: Secondary | ICD-10-CM | POA: Diagnosis not present

## 2021-07-17 DIAGNOSIS — E1122 Type 2 diabetes mellitus with diabetic chronic kidney disease: Secondary | ICD-10-CM | POA: Diagnosis not present

## 2021-07-17 DIAGNOSIS — N200 Calculus of kidney: Secondary | ICD-10-CM | POA: Diagnosis not present

## 2021-07-30 ENCOUNTER — Other Ambulatory Visit: Payer: Self-pay

## 2021-07-30 ENCOUNTER — Ambulatory Visit (INDEPENDENT_AMBULATORY_CARE_PROVIDER_SITE_OTHER): Payer: Medicare Other | Admitting: Internal Medicine

## 2021-07-30 ENCOUNTER — Encounter: Payer: Self-pay | Admitting: Internal Medicine

## 2021-07-30 VITALS — BP 140/70 | HR 87 | Ht 72.0 in | Wt 187.6 lb

## 2021-07-30 DIAGNOSIS — I129 Hypertensive chronic kidney disease with stage 1 through stage 4 chronic kidney disease, or unspecified chronic kidney disease: Secondary | ICD-10-CM | POA: Diagnosis not present

## 2021-07-30 DIAGNOSIS — Z79899 Other long term (current) drug therapy: Secondary | ICD-10-CM | POA: Diagnosis not present

## 2021-07-30 DIAGNOSIS — N200 Calculus of kidney: Secondary | ICD-10-CM | POA: Diagnosis not present

## 2021-07-30 DIAGNOSIS — N189 Chronic kidney disease, unspecified: Secondary | ICD-10-CM | POA: Diagnosis not present

## 2021-07-30 DIAGNOSIS — I4819 Other persistent atrial fibrillation: Secondary | ICD-10-CM

## 2021-07-30 DIAGNOSIS — E1122 Type 2 diabetes mellitus with diabetic chronic kidney disease: Secondary | ICD-10-CM | POA: Diagnosis not present

## 2021-07-30 DIAGNOSIS — R3129 Other microscopic hematuria: Secondary | ICD-10-CM | POA: Diagnosis not present

## 2021-07-30 NOTE — Progress Notes (Signed)
HPI Mr. Mudry returns today for followup. He is a pleasant 76 yo man with a h/o HTN, chronic atrial fib, DM, poorly controlled, who was in the hospital with ALS. He is slowly getting stronger. He still refuses systemic anti-coagulation although I have encouraged him to reconsider. He admits to being very sedentary Allergies  Allergen Reactions   Influenza Vaccines Other (See Comments)    Guillain-Barre syndrome Other reaction(s): Other (see comments) Guillain-Barre syndrome     Current Outpatient Medications  Medication Sig Dispense Refill   albuterol (VENTOLIN HFA) 108 (90 Base) MCG/ACT inhaler Inhale 2 puffs into the lungs every 6 (six) hours as needed for wheezing or shortness of breath. 8 g 2   aspirin EC 81 MG tablet Take 1 tablet (81 mg total) by mouth daily. 90 tablet 3   glipiZIDE (GLUCOTROL) 5 MG tablet Take by mouth.     glucose blood test strip Use to check your blood glucose once a day. 100 each 3   metFORMIN (GLUCOPHAGE-XR) 500 MG 24 hr tablet TAKE (1) TABLET TWICE DAILY. 60 tablet 1   metoprolol tartrate (LOPRESSOR) 25 MG tablet Take by mouth.     Multiple Vitamin (MULTIVITAMIN WITH MINERALS) TABS tablet Take 1 tablet by mouth daily. 90 tablet 2   pantoprazole (PROTONIX) 40 MG tablet TAKE 1 TABLET ONCE DAILY. 90 tablet 0   pravastatin (PRAVACHOL) 20 MG tablet Take 1 tablet (20 mg total) by mouth daily. 90 tablet 0   vitamin B-12 (CYANOCOBALAMIN) 500 MCG tablet Take 1 tablet (500 mcg total) by mouth daily. 30 tablet 1   doxycycline (VIBRAMYCIN) 100 MG capsule Take 1 capsule (100 mg total) by mouth 2 (two) times daily. 14 capsule 0   metFORMIN (GLUCOPHAGE) 500 MG tablet Take by mouth.     pantoprazole (PROTONIX) 40 MG tablet Take by mouth.     No current facility-administered medications for this visit.     Past Medical History:  Diagnosis Date   AF (atrial fibrillation) (HCC)    Atrial flutter (HCC)    Blue toes    chronic   Essential hypertension,  benign 04/23/2019   GERD (gastroesophageal reflux disease)    Gout    Guillain Barr syndrome ([place])    Nuclear sclerotic cataract of left eye 05/07/2020   Nuclear sclerotic cataract of right eye 05/07/2020   Posterior subcapsular polar age-related cataract of left eye 05/07/2020   Type II diabetes mellitus, uncontrolled 04/23/2019    ROS:   All systems reviewed and negative except as noted in the HPI.   Past Surgical History:  Procedure Laterality Date   catheter ablation  12/23/2004   COLONOSCOPY N/A 09/18/2014   Procedure: COLONOSCOPY;  Surgeon: Rogene Houston, MD;  Location: [facility];  Service: Endoscopy;  Laterality: N/A;  830   EYE SURGERY  2020   Bilateral cataract surgery   FEMUR IM NAIL Right 06/25/2013   Procedure: INTRAMEDULLARY (IM) RETROGRADE FEMORAL NAILING;  Surgeon: Marybelle Killings, MD;  Location: [facility];  Service: Orthopedics;  Laterality: Right;     Family History  Problem Relation Age of Onset   Diabetes Father    Colon cancer Father    Colon cancer Mother      Social History   Socioeconomic History   Marital status: Married    Spouse name: Not on file   Number of children: Not on file   Years of education: Not on file   Highest education level: Not on  file  Occupational History   Not on file  Tobacco Use   Smoking status: Former    Packs/day: 2.00    Years: 5.00    Pack years: 10.00    Types: Cigarettes    Start date: 11/23/2016    Quit date: 11/24/2016    Years since quitting: 4.6   Smokeless tobacco: Never  Vaping Use   Vaping Use: Never used  Substance and Sexual Activity   Alcohol use: No   Drug use: No   Sexual activity: Not on file  Other Topics Concern   Not on file  Social History Narrative   Married for 17 years,lives with wife.Retired.   Social Determinants of Health   Financial Resource Strain: Not on file  Food Insecurity: Not on file  Transportation Needs: Not on file  Physical Activity: Not on file  Stress: Not on  file  Social Connections: Not on file  Intimate Partner Violence: Not on file     BP 140/70    Pulse 87    Ht 6' (1.829 m)    Wt 187 lb 9.6 oz (85.1 kg)    SpO2 97%    BMI 25.44 kg/m   Physical Exam:  Well appearing NAD HEENT: Unremarkable Neck:  No JVD, no thyromegally Lymphatics:  No adenopathy Back:  No CVA tenderness Lungs:  Clear with no wheezes HEART:  Regular rate rhythm, no murmurs, no rubs, no clicks Abd:  soft, positive bowel sounds, no organomegally, no rebound, no guarding Ext:  2 plus pulses, no edema, no cyanosis, no clubbing Skin:  No rashes no nodules Neuro:  CN II through XII intact, motor grossly intact  EKG - atrial fib with a controlled VR and PVC's  Assess/Plan:  Atrial fib - his VR is still controlled. He still refuses systemic anti-coagulation. HTN - his bp is fairly well controlled.  Dyslipidemia - he will continue pravachol  Carleene Overlie Marybell Robards,MD

## 2021-07-30 NOTE — Patient Instructions (Signed)
Medication Instructions:  Your physician recommends that you continue on your current medications as directed. Please refer to the Current Medication list given to you today.  *If you need a refill on your cardiac medications before your next appointment, please call your pharmacy*   Lab Work: None If you have labs (blood work) drawn today and your tests are completely normal, you will receive your results only by: [place] (if you have MyChart) OR A paper copy in the mail If you have any lab test that is abnormal or we need to change your treatment, we will call you to review the results.   Testing/Procedures: None   Follow-Up: At [facility], you and your health needs are our priority.  As part of our continuing mission to provide you with exceptional heart care, we have created designated Provider Care Teams.  These Care Teams include your primary Cardiologist (physician) and Advanced Practice Providers (APPs -  Physician Assistants and Nurse Practitioners) who all work together to provide you with the care you need, when you need it.  We recommend signing up for the patient portal called "MyChart".  Sign up information is provided on this After Visit Summary.  MyChart is used to connect with patients for Virtual Visits (Telemedicine).  Patients are able to view lab/test results, encounter notes, upcoming appointments, etc.  Non-urgent messages can be sent to your provider as well.   To learn more about what you can do with MyChart, go to [email or web address].    Your next appointment:   2 year(s)  The format for your next appointment:   In Person  Provider:   Cristopher Peru, MD    Other Instructions

## 2021-07-31 ENCOUNTER — Other Ambulatory Visit (INDEPENDENT_AMBULATORY_CARE_PROVIDER_SITE_OTHER): Payer: Self-pay | Admitting: Nurse Practitioner

## 2021-08-06 ENCOUNTER — Other Ambulatory Visit: Payer: Self-pay

## 2021-08-06 ENCOUNTER — Ambulatory Visit (INDEPENDENT_AMBULATORY_CARE_PROVIDER_SITE_OTHER): Payer: Medicare Other | Admitting: Vascular Surgery

## 2021-08-06 ENCOUNTER — Encounter: Payer: Self-pay | Admitting: Vascular Surgery

## 2021-08-06 VITALS — BP 127/79 | HR 63 | Temp 98.0°F | Resp 20 | Ht 72.0 in | Wt 187.0 lb

## 2021-08-06 DIAGNOSIS — I739 Peripheral vascular disease, unspecified: Secondary | ICD-10-CM | POA: Diagnosis not present

## 2021-08-06 NOTE — Progress Notes (Signed)
REASON FOR VISIT:   Peripheral vascular disease.  The consult is requested by Dr. Josephine Igo.  MEDICAL ISSUES:   PERIPHERAL VASCULAR DISEASE: This patient does have evidence of infrainguinal arterial occlusive disease bilaterally.  However, he is asymptomatic.  Except for the discoloration of his feet.  I reassured him that he has a palpable pulse on each side.  He is not a smoker.  I have encouraged him to stay as active as possible.  He understands that elevating his legs will improve the discoloration which I think represents acrocyanosis.  Given his underlying infrainguinal arterial occlusive disease I have ordered follow-up ABIs in 1 year and I will see him back at that time.  He knows to call sooner if he has problems.  Importantly, he has a very small wound on the tip of his left great toe where he just stubbed his toes a few days ago.  I explained that if this does not improve fairly quickly that we would want to know as he may need a more aggressive work-up.  If the wound progresses then I think I would recommend arteriography to further evaluate his infrainguinal arterial occlusive disease.   HPI:   Marcus [name] is a pleasant 76 y.o. male who I saw to evaluate for peripheral vascular disease on 08/09/2019.  At that time he did have some evidence of underlying infrainguinal arterial occlusive disease but was essentially asymptomatic except for some discoloration of his feet which I felt was related to acrocyanosis.  We discussed the importance of exercise.  He was not a smoker.  He comes back today for follow-up evaluation.  On my history, he denies any history of claudication, rest pain, or nonhealing ulcers.  He did however stubbed his left great toe recently and has a small wound there.  This is only been present for a few days.  He is not a smoker.  He quit smoking in 2018.  He had smoked up to 2 packs/day.  His risk factors for peripheral vascular disease include  diabetes and hypertension.   Past Medical History:  Diagnosis Date   AF (atrial fibrillation) (HCC)    Atrial flutter (HCC)    Blue toes    chronic   Essential hypertension, benign 04/23/2019   GERD (gastroesophageal reflux disease)    Gout    Guillain Barr syndrome ([place])    Nuclear sclerotic cataract of left eye 05/07/2020   Nuclear sclerotic cataract of right eye 05/07/2020   Posterior subcapsular polar age-related cataract of left eye 05/07/2020   Type II diabetes mellitus, uncontrolled 04/23/2019    Family History  Problem Relation Age of Onset   Diabetes Father    Colon cancer Father    Colon cancer Mother     SOCIAL HISTORY: Social History   Tobacco Use   Smoking status: Former    Packs/day: 2.00    Years: 5.00    Pack years: 10.00    Types: Cigarettes    Start date: 11/23/2016    Quit date: 11/24/2016    Years since quitting: 4.7   Smokeless tobacco: Never  Substance Use Topics   Alcohol use: No    Allergies  Allergen Reactions   Influenza Vaccines Other (See Comments)    Guillain-Barre syndrome Other reaction(s): Other (see comments) Guillain-Barre syndrome    Current Outpatient Medications  Medication Sig Dispense Refill   albuterol (VENTOLIN HFA) 108 (90 Base) MCG/ACT inhaler Inhale 2 puffs into the lungs every 6 (  six) hours as needed for wheezing or shortness of breath. 8 g 2   aspirin EC 81 MG tablet Take 1 tablet (81 mg total) by mouth daily. 90 tablet 3   glipiZIDE (GLUCOTROL) 5 MG tablet Take by mouth.     glucose blood test strip Use to check your blood glucose once a day. 100 each 3   metoprolol tartrate (LOPRESSOR) 25 MG tablet Take by mouth.     Multiple Vitamin (MULTIVITAMIN WITH MINERALS) TABS tablet Take 1 tablet by mouth daily. 90 tablet 2   pantoprazole (PROTONIX) 40 MG tablet TAKE 1 TABLET ONCE DAILY. 90 tablet 0   pravastatin (PRAVACHOL) 20 MG tablet Take 1 tablet (20 mg total) by mouth daily. 90 tablet 0   vitamin B-12  (CYANOCOBALAMIN) 500 MCG tablet Take 1 tablet (500 mcg total) by mouth daily. 30 tablet 1   metFORMIN (GLUCOPHAGE-XR) 500 MG 24 hr tablet TAKE (1) TABLET TWICE DAILY. 60 tablet 1   No current facility-administered medications for this visit.    REVIEW OF SYSTEMS:  [X]  denotes positive finding, [ ]  denotes negative finding Cardiac  Comments:  Chest pain or chest pressure:    Shortness of breath upon exertion: x   Short of breath when lying flat:    Irregular heart rhythm:        Vascular    Pain in calf, thigh, or hip brought on by ambulation:    Pain in feet at night that wakes you up from your sleep:     Blood clot in your veins:    Leg swelling:         Pulmonary    Oxygen at home:    Productive cough:     Wheezing:         Neurologic    Sudden weakness in arms or legs:     Sudden numbness in arms or legs:     Sudden onset of difficulty speaking or slurred speech:    Temporary loss of vision in one eye:     Problems with dizziness:         Gastrointestinal    Blood in stool:     Vomited blood:         Genitourinary    Burning when urinating:     Blood in urine:        Psychiatric    Major depression:         Hematologic    Bleeding problems:    Problems with blood clotting too easily:        Skin    Rashes or ulcers:        Constitutional    Fever or chills:     PHYSICAL EXAM:   Vitals:   08/06/21 1317  BP: 127/79  Pulse: 63  Resp: 20  Temp: 98 F (36.7 C)  SpO2: 94%  Weight: 187 lb (84.8 kg)  Height: 6' (1.829 m)    GENERAL: The patient is a well-nourished male, in no acute distress. The vital signs are documented above. CARDIAC: There is a regular rate and rhythm.  VASCULAR: I do not detect carotid bruits. On the right side he has a palpable femoral pulse and posterior tibial pulse.  He has a biphasic posterior tibial signal on the right with a Doppler and a monophasic dorsalis pedis signal. On the left side, he has a palpable femoral pulse  and a palpable dorsalis pedis pulse.  He has a biphasic dorsalis pedis signal with a  Doppler and monophasic posterior tibial signal. He has acrocyanosis of both feet.  The color improves with elevation of his legs. PULMONARY: There is good air exchange bilaterally without wheezing or rales. ABDOMEN: Soft and non-tender with normal pitched bowel sounds.  MUSCULOSKELETAL: There are no major deformities or cyanosis. NEUROLOGIC: No focal weakness or paresthesias are detected. SKIN: He has a small wound on the left great toe as documented in the photograph below.     PSYCHIATRIC: The patient has a normal affect.  DATA:    ARTERIAL DOPPLER STUDY: I have reviewed the arterial Doppler study that was done on 07/13/2021.  ABI on the right side was 92%.  His toe brachial index on the right was 30%.  ABI on the left side was 97%.  His toe brachial index on the left was 63%.  Deitra Mayo [facility] [phone]

## 2021-08-13 DIAGNOSIS — R82991 Hypocitraturia: Secondary | ICD-10-CM | POA: Diagnosis not present

## 2021-08-13 DIAGNOSIS — E1122 Type 2 diabetes mellitus with diabetic chronic kidney disease: Secondary | ICD-10-CM | POA: Diagnosis not present

## 2021-08-13 DIAGNOSIS — E875 Hyperkalemia: Secondary | ICD-10-CM | POA: Diagnosis not present

## 2021-08-13 DIAGNOSIS — R82992 Hyperoxaluria: Secondary | ICD-10-CM | POA: Diagnosis not present

## 2021-08-13 DIAGNOSIS — N200 Calculus of kidney: Secondary | ICD-10-CM | POA: Diagnosis not present

## 2021-08-13 DIAGNOSIS — N189 Chronic kidney disease, unspecified: Secondary | ICD-10-CM | POA: Diagnosis not present

## 2021-08-13 DIAGNOSIS — D751 Secondary polycythemia: Secondary | ICD-10-CM | POA: Diagnosis not present

## 2021-08-13 DIAGNOSIS — I129 Hypertensive chronic kidney disease with stage 1 through stage 4 chronic kidney disease, or unspecified chronic kidney disease: Secondary | ICD-10-CM | POA: Diagnosis not present

## 2021-08-20 ENCOUNTER — Telehealth: Payer: Self-pay | Admitting: *Deleted

## 2021-08-20 DIAGNOSIS — N200 Calculus of kidney: Secondary | ICD-10-CM | POA: Diagnosis not present

## 2021-08-20 DIAGNOSIS — E1122 Type 2 diabetes mellitus with diabetic chronic kidney disease: Secondary | ICD-10-CM | POA: Diagnosis not present

## 2021-08-20 DIAGNOSIS — D751 Secondary polycythemia: Secondary | ICD-10-CM | POA: Diagnosis not present

## 2021-08-20 DIAGNOSIS — E875 Hyperkalemia: Secondary | ICD-10-CM | POA: Diagnosis not present

## 2021-08-20 DIAGNOSIS — R82992 Hyperoxaluria: Secondary | ICD-10-CM | POA: Diagnosis not present

## 2021-08-20 DIAGNOSIS — R82991 Hypocitraturia: Secondary | ICD-10-CM | POA: Diagnosis not present

## 2021-08-20 DIAGNOSIS — N189 Chronic kidney disease, unspecified: Secondary | ICD-10-CM | POA: Diagnosis not present

## 2021-08-20 DIAGNOSIS — I129 Hypertensive chronic kidney disease with stage 1 through stage 4 chronic kidney disease, or unspecified chronic kidney disease: Secondary | ICD-10-CM | POA: Diagnosis not present

## 2021-08-20 NOTE — Telephone Encounter (Signed)
Patient inquired through my chart about toe.  Per patient, his toe is unchanged and will have his wife observing it and if worsens he will call for an appointment. Patient voiced understanding of the instructions.

## 2021-08-24 ENCOUNTER — Other Ambulatory Visit (INDEPENDENT_AMBULATORY_CARE_PROVIDER_SITE_OTHER): Payer: Self-pay | Admitting: Nurse Practitioner

## 2021-09-02 ENCOUNTER — Ambulatory Visit (INDEPENDENT_AMBULATORY_CARE_PROVIDER_SITE_OTHER): Payer: Medicare Other | Admitting: Podiatry

## 2021-09-02 ENCOUNTER — Other Ambulatory Visit: Payer: Self-pay

## 2021-09-02 ENCOUNTER — Encounter: Payer: Self-pay | Admitting: Podiatry

## 2021-09-02 DIAGNOSIS — B351 Tinea unguium: Secondary | ICD-10-CM | POA: Diagnosis not present

## 2021-09-02 DIAGNOSIS — E1142 Type 2 diabetes mellitus with diabetic polyneuropathy: Secondary | ICD-10-CM | POA: Diagnosis not present

## 2021-09-02 DIAGNOSIS — Q828 Other specified congenital malformations of skin: Secondary | ICD-10-CM

## 2021-09-02 DIAGNOSIS — G610 Guillain-Barre syndrome: Secondary | ICD-10-CM

## 2021-09-02 DIAGNOSIS — S91101A Unspecified open wound of right great toe without damage to nail, initial encounter: Secondary | ICD-10-CM | POA: Diagnosis not present

## 2021-09-02 DIAGNOSIS — I739 Peripheral vascular disease, unspecified: Secondary | ICD-10-CM

## 2021-09-02 NOTE — Progress Notes (Signed)
Subjective:  Patient ID: Marcus [name], male    DOB: Dec 31, 1945,  MRN: [id number]  Marcus [name] presents to clinic today for at risk foot care. Patient has history of NIDDM, neuropathy, PAD and GBS.  He is seen for painful elongated mycotic toenails 1-5 bilaterally which are tender when wearing enclosed shoe gear. Pain is relieved with periodic professional debridement.  Patient states blood glucose was 124 mg/dl today.    New problem(s): Patient states he stubbed his left great toe last month. He states it bled and his wife dressed the toe. He states he has followed up with Dr. Scot Dock in Vascular Surgery. Marcus [name] feels the toe looks much better . He denies any redness, drainage or swelling. Denies any fever, chills, night sweats, nausea or vomiting.               PCP is [place] Nation, MD , and last visit was 08/17/2021.  Allergies  Allergen Reactions   Influenza Vaccines Other (See Comments)    Guillain-Barre syndrome Other reaction(s): Other (see comments) Guillain-Barre syndrome    Review of Systems: Negative except as noted in the HPI. Objective:   Constitutional Marcus [name] is a pleasant 76 y.o. Caucasian male, WD, WN in NAD. AAO x 3.   Vascular CFT <3 seconds b/l LE. Palpable DP pulse(s) left lower extremity Palpable PT pulse(s) right lower extremity Diminished DP pulse(s) right lower extremity. Diminished PT pulse(s) left lower extremity. Pedal hair absent. No pain with calf compression b/l. Lower extremity skin temperature gradient warm to cool. Cyanosis present bilateral lower extremities. Trace edema noted BLE.  Neurologic Normal speech. Oriented to person, place, and time. Protective sensation diminished with 10g monofilament b/l. Vibratory sensation diminished b/l.  Dermatologic Pedal skin thin, shiny and atrophic b/l LE. Scab noted distal aspect of left hallux. No signs of infection, no fluctuance, no underlying  abscess, no ischemia, no gangrene. Toenails 1-5 b/l elongated, discolored, dystrophic, thickened, crumbly with subungual debris and tenderness to dorsal palpation. Hyperkeratotic lesion(s) submet head 5 left foot.  No erythema, no edema, no drainage, no fluctuance. Porokeratotic lesion(s) submet head 5 right foot. No erythema, no edema, no drainage, no fluctuance.  Orthopedic: Normal muscle strength 5/5 to all lower extremity muscle groups bilaterally. HAV with bunion deformity noted b/l LE. Hammertoe deformity noted 2-5 b/l.Marland Kitchen No pain, crepitus or joint limitation noted with ROM b/l LE.  Patient ambulates independently without assistive aids.   Radiographs: None  Last A1c:  Hemoglobin A1C Latest Ref Rng & Units 02/12/2021 10/08/2020  HGBA1C <5.7 % of total Hgb 6.6(H) 6.4(H)  Some recent data might be hidden   Assessment:   1. Onychomycosis   2. Open wound of right great toe, initial encounter   3. Porokeratosis   4. PAD (peripheral artery disease) ([place])   5. GBS (Guillain Barre syndrome) ([place])   6. Diabetic peripheral neuropathy associated with type 2 diabetes mellitus ([place])    Plan:  Patient was evaluated and treated and all questions answered. Consent given for treatment as described below: -Patient with trauma left hallux on last month. Has seen Dr. Scot Dock. Digit appears to be healing with no signs of infection, no ischemia, no gangrene. He is to keep toe protected when wearing a shoe. Do not disturb scab. Allow scab to fall off. Call me or contact me through MyChart if he or his wife has any questions/concerns.. -Mycotic toenails 1-5 bilaterally were debrided in length and girth with sterile nail  nippers and dremel without incident. -Callus(es) submet head 5 left foot pared utilizing mandrel sander without complication or incident. Total number debrided =1. -Painful porokeratotic lesion(s) submet head 5 right foot pared and enucleated with sterile scalpel blade without incident. Total  number of lesions debrided=1. -Patient to report any pedal injuries to medical professional immediately. -Patient/POA to call should there be question/concern in the interim.  Return in about 9 weeks (around 11/04/2021).  Marzetta Board, DPM

## 2021-09-28 DIAGNOSIS — E1142 Type 2 diabetes mellitus with diabetic polyneuropathy: Secondary | ICD-10-CM | POA: Diagnosis not present

## 2021-09-28 DIAGNOSIS — R230 Cyanosis: Secondary | ICD-10-CM | POA: Diagnosis not present

## 2021-09-28 DIAGNOSIS — L97919 Non-pressure chronic ulcer of unspecified part of right lower leg with unspecified severity: Secondary | ICD-10-CM | POA: Diagnosis not present

## 2021-09-28 DIAGNOSIS — I10 Essential (primary) hypertension: Secondary | ICD-10-CM | POA: Diagnosis not present

## 2021-09-28 DIAGNOSIS — I739 Peripheral vascular disease, unspecified: Secondary | ICD-10-CM | POA: Diagnosis not present

## 2021-09-28 DIAGNOSIS — K219 Gastro-esophageal reflux disease without esophagitis: Secondary | ICD-10-CM | POA: Diagnosis not present

## 2021-09-28 DIAGNOSIS — E785 Hyperlipidemia, unspecified: Secondary | ICD-10-CM | POA: Diagnosis not present

## 2021-09-30 DIAGNOSIS — N189 Chronic kidney disease, unspecified: Secondary | ICD-10-CM | POA: Diagnosis not present

## 2021-09-30 DIAGNOSIS — E1122 Type 2 diabetes mellitus with diabetic chronic kidney disease: Secondary | ICD-10-CM | POA: Diagnosis not present

## 2021-09-30 DIAGNOSIS — I129 Hypertensive chronic kidney disease with stage 1 through stage 4 chronic kidney disease, or unspecified chronic kidney disease: Secondary | ICD-10-CM | POA: Diagnosis not present

## 2021-09-30 DIAGNOSIS — R3129 Other microscopic hematuria: Secondary | ICD-10-CM | POA: Diagnosis not present

## 2021-09-30 DIAGNOSIS — R82992 Hyperoxaluria: Secondary | ICD-10-CM | POA: Diagnosis not present

## 2021-09-30 DIAGNOSIS — R82991 Hypocitraturia: Secondary | ICD-10-CM | POA: Diagnosis not present

## 2021-09-30 DIAGNOSIS — N200 Calculus of kidney: Secondary | ICD-10-CM | POA: Diagnosis not present

## 2021-10-14 ENCOUNTER — Other Ambulatory Visit: Payer: Self-pay | Admitting: Internal Medicine

## 2021-10-15 DIAGNOSIS — N200 Calculus of kidney: Secondary | ICD-10-CM | POA: Diagnosis not present

## 2021-10-15 DIAGNOSIS — E1122 Type 2 diabetes mellitus with diabetic chronic kidney disease: Secondary | ICD-10-CM | POA: Diagnosis not present

## 2021-10-15 DIAGNOSIS — R82991 Hypocitraturia: Secondary | ICD-10-CM | POA: Diagnosis not present

## 2021-10-15 DIAGNOSIS — N189 Chronic kidney disease, unspecified: Secondary | ICD-10-CM | POA: Diagnosis not present

## 2021-10-15 DIAGNOSIS — D751 Secondary polycythemia: Secondary | ICD-10-CM | POA: Diagnosis not present

## 2021-10-15 DIAGNOSIS — I129 Hypertensive chronic kidney disease with stage 1 through stage 4 chronic kidney disease, or unspecified chronic kidney disease: Secondary | ICD-10-CM | POA: Diagnosis not present

## 2021-10-29 DIAGNOSIS — I10 Essential (primary) hypertension: Secondary | ICD-10-CM | POA: Diagnosis not present

## 2021-10-29 DIAGNOSIS — Z13220 Encounter for screening for lipoid disorders: Secondary | ICD-10-CM | POA: Diagnosis not present

## 2021-10-29 DIAGNOSIS — E1142 Type 2 diabetes mellitus with diabetic polyneuropathy: Secondary | ICD-10-CM | POA: Diagnosis not present

## 2021-10-29 DIAGNOSIS — E559 Vitamin D deficiency, unspecified: Secondary | ICD-10-CM | POA: Diagnosis not present

## 2021-10-29 DIAGNOSIS — K219 Gastro-esophageal reflux disease without esophagitis: Secondary | ICD-10-CM | POA: Diagnosis not present

## 2021-10-29 DIAGNOSIS — Z9229 Personal history of other drug therapy: Secondary | ICD-10-CM | POA: Diagnosis not present

## 2021-10-29 DIAGNOSIS — Z0001 Encounter for general adult medical examination with abnormal findings: Secondary | ICD-10-CM | POA: Diagnosis not present

## 2021-11-05 DIAGNOSIS — K219 Gastro-esophageal reflux disease without esophagitis: Secondary | ICD-10-CM | POA: Diagnosis not present

## 2021-11-05 DIAGNOSIS — E875 Hyperkalemia: Secondary | ICD-10-CM | POA: Diagnosis not present

## 2021-11-05 DIAGNOSIS — E1142 Type 2 diabetes mellitus with diabetic polyneuropathy: Secondary | ICD-10-CM | POA: Diagnosis not present

## 2021-11-05 DIAGNOSIS — I4891 Unspecified atrial fibrillation: Secondary | ICD-10-CM | POA: Diagnosis not present

## 2021-11-06 ENCOUNTER — Ambulatory Visit (INDEPENDENT_AMBULATORY_CARE_PROVIDER_SITE_OTHER): Payer: Medicare Other | Admitting: Podiatry

## 2021-11-06 ENCOUNTER — Encounter: Payer: Self-pay | Admitting: Podiatry

## 2021-11-06 DIAGNOSIS — I739 Peripheral vascular disease, unspecified: Secondary | ICD-10-CM | POA: Diagnosis not present

## 2021-11-06 DIAGNOSIS — G610 Guillain-Barre syndrome: Secondary | ICD-10-CM

## 2021-11-06 DIAGNOSIS — B351 Tinea unguium: Secondary | ICD-10-CM | POA: Diagnosis not present

## 2021-11-06 DIAGNOSIS — L84 Corns and callosities: Secondary | ICD-10-CM

## 2021-11-06 DIAGNOSIS — E1142 Type 2 diabetes mellitus with diabetic polyneuropathy: Secondary | ICD-10-CM | POA: Diagnosis not present

## 2021-11-06 DIAGNOSIS — Q828 Other specified congenital malformations of skin: Secondary | ICD-10-CM

## 2021-11-15 NOTE — Progress Notes (Signed)
?  Subjective:  ?Patient ID: Marcus [name], male    DOB: 09-Oct-1945,  MRN: [id number] ? ?Marcus [name] presents to clinic today for at risk foot care. Patient has history of NIDDM, neuropathy, PAD and GBS.  He is seen for painful elongated mycotic toenails 1-5 bilaterally which are tender when wearing enclosed shoe gear. Pain is relieved with periodic professional debridement. ? ?He relates no new problems.He continues to be followed by Vascular Surgery for his PAD. Last visit with Dr.Dickson in January. At that visit he has injury to left great toe and this has completely resolved. ? ?PCP is [place] Nation, MD , and last visit was 09/28/2021. ? ?Allergies  ?Allergen Reactions  ? Influenza Vaccines Other (See Comments)  ?  Guillain-Barre syndrome ?Other reaction(s): Other (see comments) ?Guillain-Barre syndrome  ? ? ?Review of Systems: Negative except as noted in the HPI. ?Objective:  ? ?Constitutional Marcus [name] is a pleasant 76 y.o. Caucasian male, WD, WN in NAD. AAO x 3.   ?Vascular CFT <3 seconds b/l LE. Palpable DP pulse(s) left lower extremity Palpable PT pulse(s) right lower extremity Diminished DP pulse(s) right lower extremity. Diminished PT pulse(s) left lower extremity. Pedal hair absent. No pain with calf compression b/l. Lower extremity skin temperature gradient warm to cool. Cyanosis present bilateral lower extremities. Trace edema noted BLE.  ?Neurologic Normal speech. Oriented to person, place, and time. Protective sensation diminished with 10g monofilament b/l. Vibratory sensation diminished b/l.  ?Dermatologic Healing abrasion left 5th digit with no ischemia. Pedal skin thin, shiny and atrophic b/l LE. No open wounds b/l LE. No interdigital macerations noted b/l LE. Toenails 1-5 b/l elongated, discolored, dystrophic, thickened, crumbly with subungual debris and tenderness to dorsal palpation. Hyperkeratotic lesion(s) submet head 5 left foot.  No erythema, no  edema, no drainage, no fluctuance. Porokeratotic lesion(s) submet head 5 right foot. No erythema, no edema, no drainage, no fluctuance.  ?Orthopedic: Normal muscle strength 5/5 to all lower extremity muscle groups bilaterally. HAV with bunion deformity noted b/l LE. Hammertoe deformity noted 2-5 b/l.Marland Kitchen No pain, crepitus or joint limitation noted with ROM b/l LE.  Patient ambulates independently without assistive aids.  ? ?Radiographs: None ? ?Last A1c:  ? ?  Latest Ref Rng & Units 02/12/2021  ?  9:35 AM  ?Hemoglobin A1C  ?Hemoglobin-A1c <5.7 % of total Hgb 6.6    ? ?Assessment:  ? ?1. Onychomycosis   ?2. Porokeratosis   ?3. Callus   ?4. GBS (Guillain Barre syndrome) ([place])   ?5. PAD (peripheral artery disease) ([place])   ?6. Diabetic peripheral neuropathy associated with type 2 diabetes mellitus ([place])   ? ?Plan:  ?Patient was evaluated and treated and all questions answered. ?Consent given for treatment as described below: ?Left hallux injury resolved. He has healing abrasion left 5th digit. He knows to call Dr.Dickson if he has any problems with this. ?-Mycotic toenails 1-5 bilaterally were debrided in length and girth with sterile nail nippers and dremel without incident. ?-Callus(es) submet head 5 left foot pared utilizing mandrel sander without complication or incident. Total number debrided =1. ?-Painful porokeratotic lesion(s) submet head 5 right foot pared and enucleated with sterile scalpel blade without incident. Total number of lesions debrided=1. ?-Patient to report any pedal injuries to medical professional immediately. ?-Patient/POA to call should there be question/concern in the interim. ? ?Return in about 9 weeks (around 01/08/2022). ? ?Marzetta Board, DPM ?

## 2021-12-08 DIAGNOSIS — L570 Actinic keratosis: Secondary | ICD-10-CM | POA: Diagnosis not present

## 2021-12-15 IMAGING — US US RENAL
1 series · 14 of 25 positions shown · non-contrast
Comparison: None.

CLINICAL DATA: Chronic kidney disease due to type 2 diabetes.

EXAM:
RENAL / URINARY TRACT ULTRASOUND COMPLETE

[Series 1: us renal · 0.23mm/px · 14 of 60 slices shown]
[im 1/60]
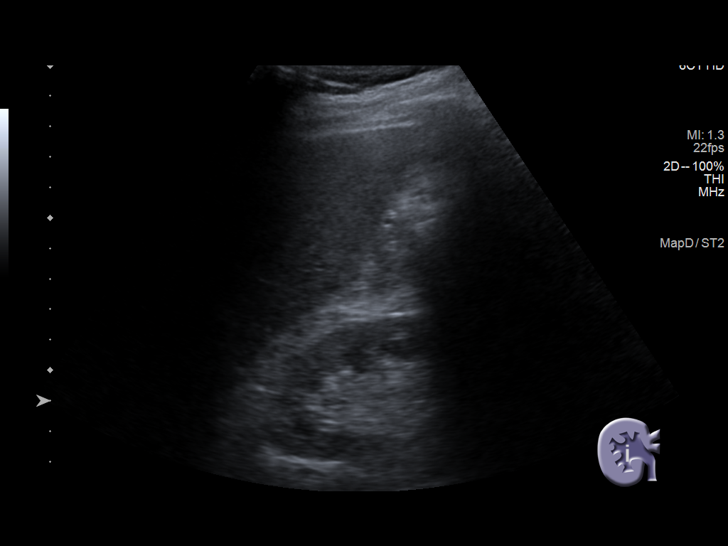
[im 5/60]
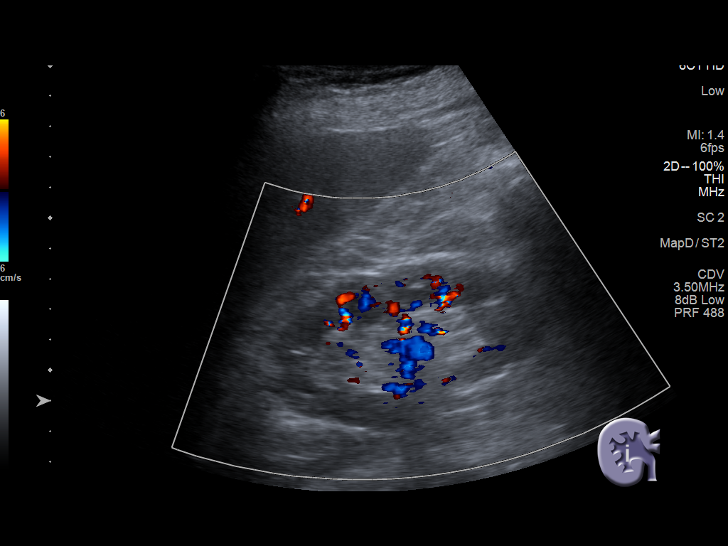
[im 10/60]
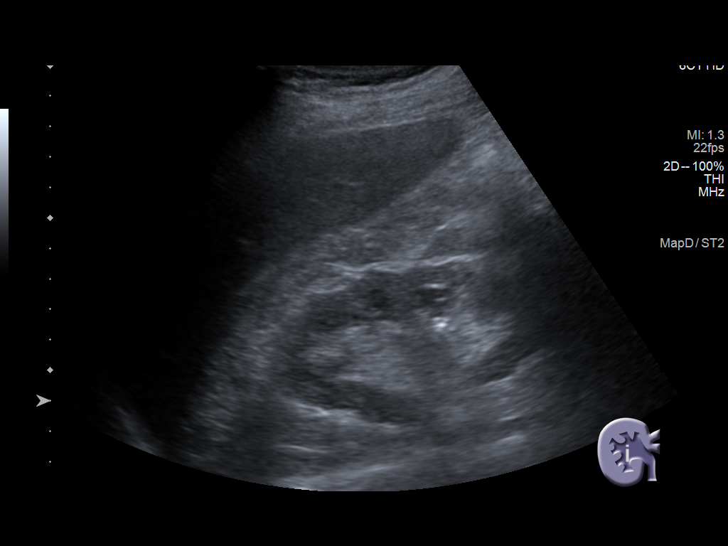
[im 15/60]
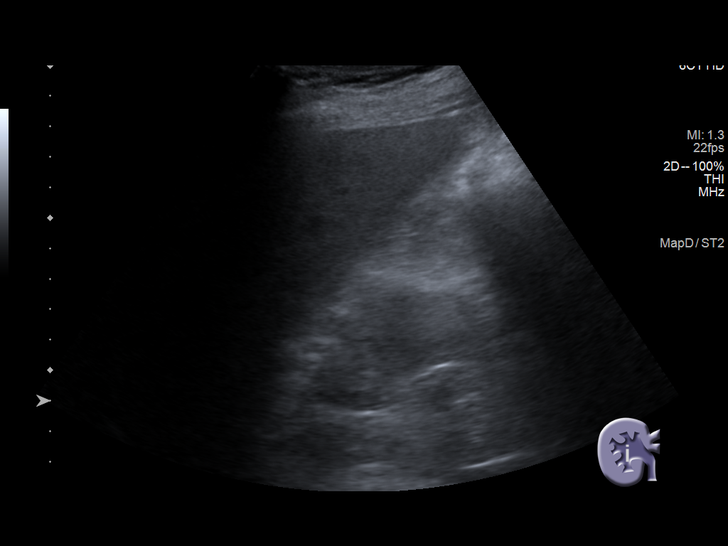
[im 20/60]
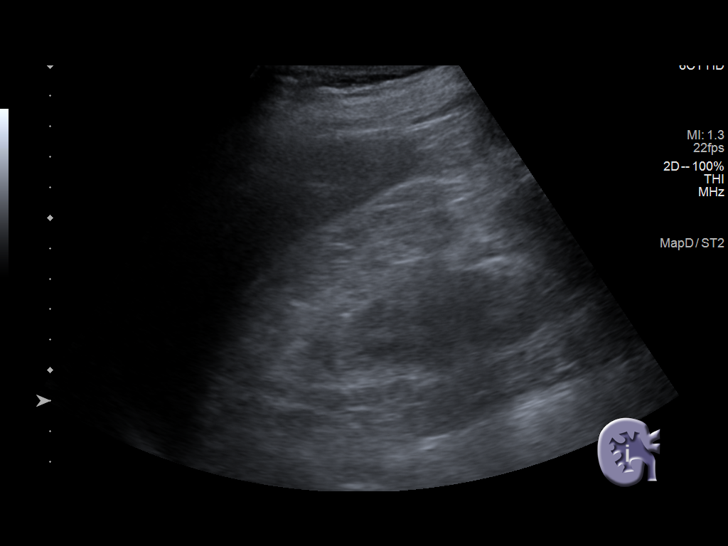
[im 23/60]
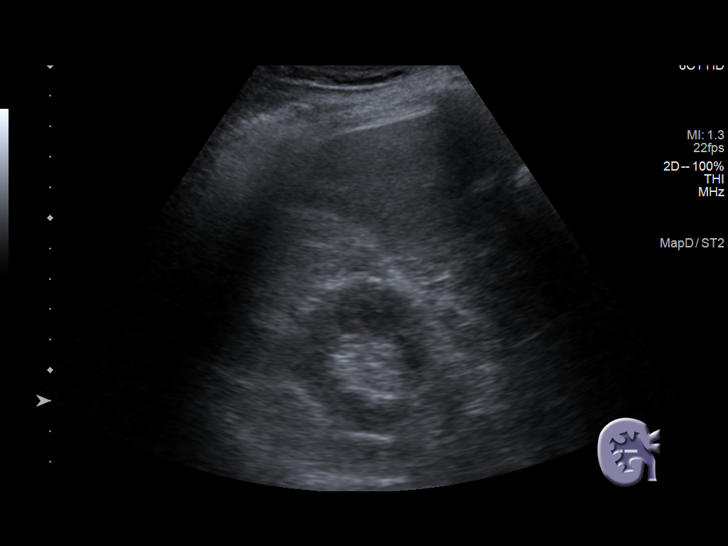
[im 28/60]
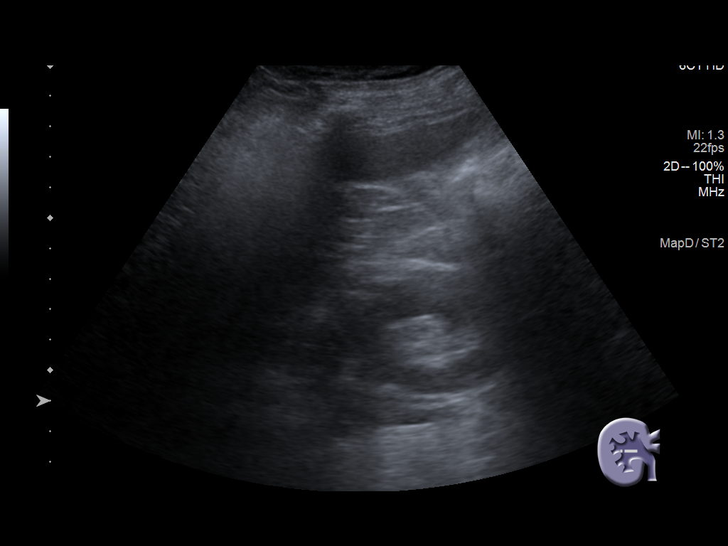
[im 32/60]
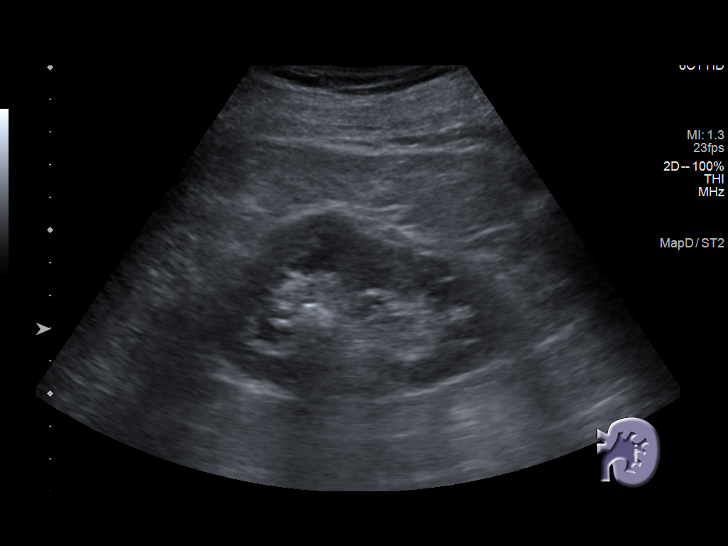
[im 37/60]
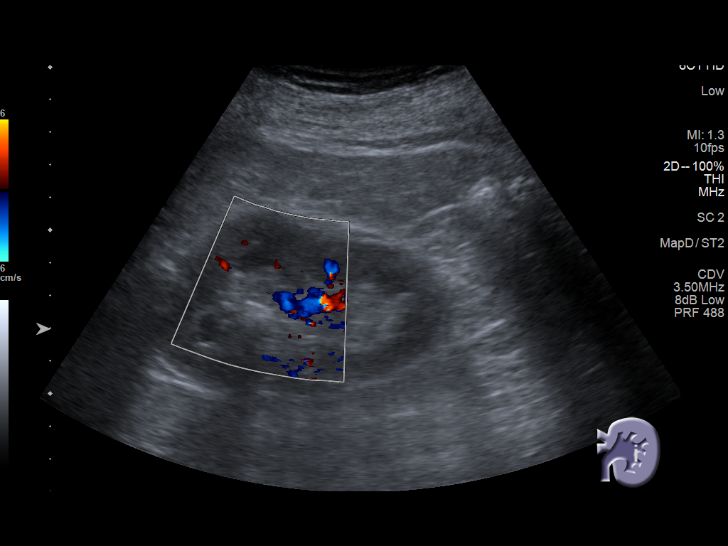
[im 40/60]
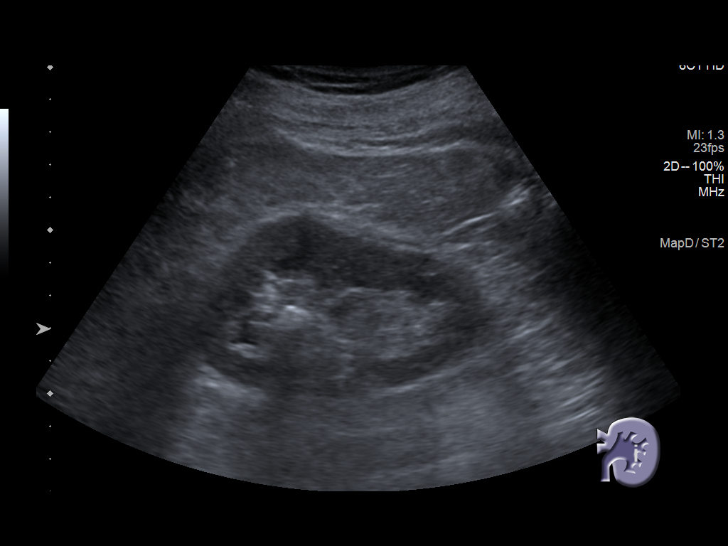
[im 45/60]
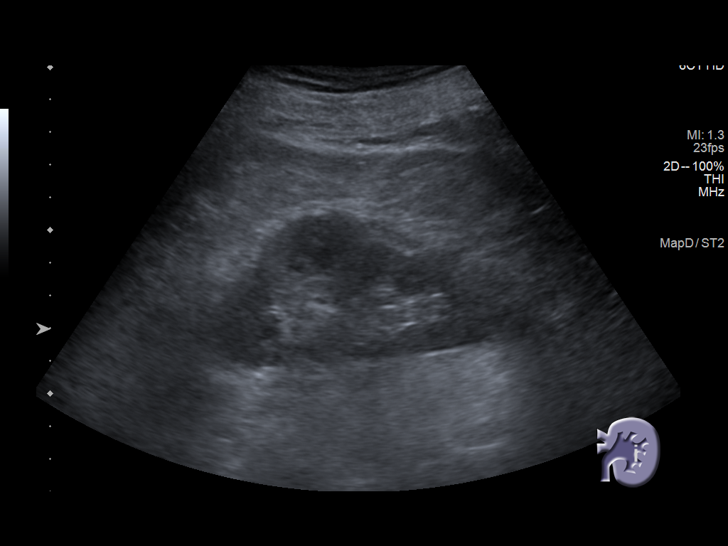
[im 50/60]
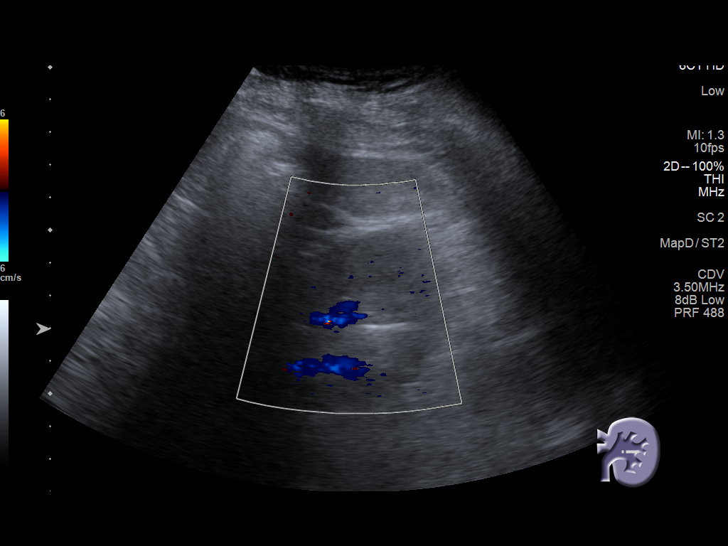
[im 55/60]
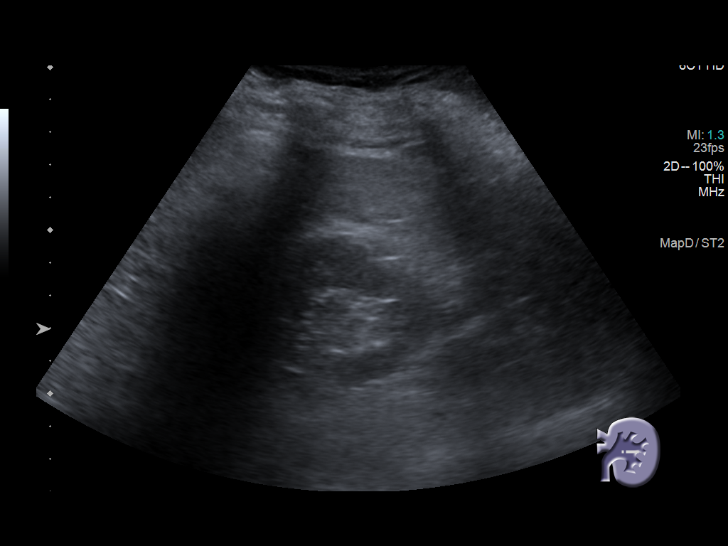
[im 60/60]
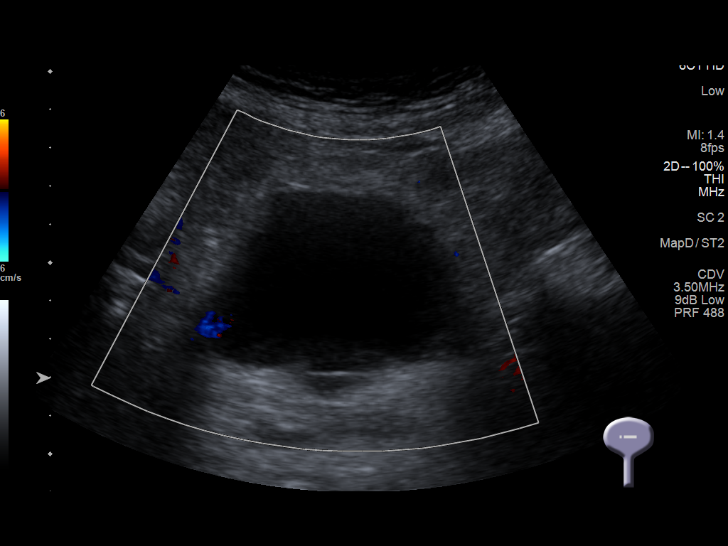

[14 of 25 positions shown; findings below may reference images not displayed]

FINDINGS: Right Kidney:

Renal measurements: 9.7 x 4.7 x 4.6 cm = volume: 111 mL.
Echogenicity within normal limits. No mass or hydronephrosis
visualized. Evidence of minimal nephrolithiasis with largest stone 5
mm over the lower pole.

Left Kidney:

Renal measurements: 9.4 x 5.6 x 5.0 cm = volume: 37 mL. Echogenicity
within normal limits. No mass or hydronephrosis visualized. Evidence
of mild nephrolithiasis with 8 mm stone over the mid to upper pole.

Bladder:

Appears normal for degree of bladder distention.

Other:

None.
IMPRESSION: Normal size kidneys without hydronephrosis. Evidence of bilateral
nephrolithiasis.

## 2021-12-29 DIAGNOSIS — Z1389 Encounter for screening for other disorder: Secondary | ICD-10-CM | POA: Diagnosis not present

## 2021-12-29 DIAGNOSIS — R230 Cyanosis: Secondary | ICD-10-CM | POA: Diagnosis not present

## 2021-12-29 DIAGNOSIS — I10 Essential (primary) hypertension: Secondary | ICD-10-CM | POA: Diagnosis not present

## 2021-12-29 DIAGNOSIS — I739 Peripheral vascular disease, unspecified: Secondary | ICD-10-CM | POA: Diagnosis not present

## 2021-12-29 DIAGNOSIS — K219 Gastro-esophageal reflux disease without esophagitis: Secondary | ICD-10-CM | POA: Diagnosis not present

## 2021-12-29 DIAGNOSIS — L97919 Non-pressure chronic ulcer of unspecified part of right lower leg with unspecified severity: Secondary | ICD-10-CM | POA: Diagnosis not present

## 2021-12-29 DIAGNOSIS — Z0001 Encounter for general adult medical examination with abnormal findings: Secondary | ICD-10-CM | POA: Diagnosis not present

## 2021-12-29 DIAGNOSIS — Z1331 Encounter for screening for depression: Secondary | ICD-10-CM | POA: Diagnosis not present

## 2021-12-29 DIAGNOSIS — E785 Hyperlipidemia, unspecified: Secondary | ICD-10-CM | POA: Diagnosis not present

## 2021-12-29 DIAGNOSIS — E1142 Type 2 diabetes mellitus with diabetic polyneuropathy: Secondary | ICD-10-CM | POA: Diagnosis not present

## 2021-12-30 ENCOUNTER — Encounter (INDEPENDENT_AMBULATORY_CARE_PROVIDER_SITE_OTHER): Payer: Self-pay | Admitting: *Deleted

## 2022-01-07 DIAGNOSIS — N200 Calculus of kidney: Secondary | ICD-10-CM | POA: Diagnosis not present

## 2022-01-07 DIAGNOSIS — N189 Chronic kidney disease, unspecified: Secondary | ICD-10-CM | POA: Diagnosis not present

## 2022-01-07 DIAGNOSIS — I129 Hypertensive chronic kidney disease with stage 1 through stage 4 chronic kidney disease, or unspecified chronic kidney disease: Secondary | ICD-10-CM | POA: Diagnosis not present

## 2022-01-07 DIAGNOSIS — D751 Secondary polycythemia: Secondary | ICD-10-CM | POA: Diagnosis not present

## 2022-01-07 DIAGNOSIS — R82991 Hypocitraturia: Secondary | ICD-10-CM | POA: Diagnosis not present

## 2022-01-07 DIAGNOSIS — E1122 Type 2 diabetes mellitus with diabetic chronic kidney disease: Secondary | ICD-10-CM | POA: Diagnosis not present

## 2022-01-08 ENCOUNTER — Ambulatory Visit (INDEPENDENT_AMBULATORY_CARE_PROVIDER_SITE_OTHER): Payer: Medicare Other | Admitting: Podiatry

## 2022-01-08 ENCOUNTER — Encounter: Payer: Self-pay | Admitting: Podiatry

## 2022-01-08 ENCOUNTER — Other Ambulatory Visit (INDEPENDENT_AMBULATORY_CARE_PROVIDER_SITE_OTHER): Payer: Self-pay

## 2022-01-08 DIAGNOSIS — I739 Peripheral vascular disease, unspecified: Secondary | ICD-10-CM

## 2022-01-08 DIAGNOSIS — B351 Tinea unguium: Secondary | ICD-10-CM

## 2022-01-08 DIAGNOSIS — Z86010 Personal history of colonic polyps: Secondary | ICD-10-CM

## 2022-01-08 DIAGNOSIS — E1142 Type 2 diabetes mellitus with diabetic polyneuropathy: Secondary | ICD-10-CM | POA: Diagnosis not present

## 2022-01-08 NOTE — Progress Notes (Signed)
This patient returns to my office for at risk foot care.  This patient requires this care by a professional since this patient will be at risk due to having diabetes and CKD.  This patient is unable to cut nails himself since the patient cannot reach his nails.These nails are painful walking and wearing shoes.  This patient presents for at risk foot care today.  General Appearance  Alert, conversant and in no acute stress.  Vascular  Dorsalis pedis and posterior tibial  pulses are weakly palpable  bilaterally.  Capillary return is within normal limits  bilaterally. Temperature is within normal limits  bilaterally.  Neurologic  Senn-Weinstein monofilament wire test within normal limits  bilaterally. Muscle power within normal limits bilaterally.  Nails Thick disfigured discolored nails with subungual debris  from hallux to fifth toes bilaterally. No evidence of bacterial infection or drainage bilaterally.  Orthopedic  No limitations of motion  feet .  No crepitus or effusions noted.  No bony pathology or digital deformities noted.  Skin  normotropic skin with no porokeratosis noted bilaterally.  No signs of infections or ulcers noted.     Onychomycosis  Pain in right toes  Pain in left toes  Consent was obtained for treatment procedures.   Mechanical debridement of nails 1-5  bilaterally performed with a nail nipper.  Filed with dremel without incident.    Return office visit    9 weeks                  Told patient to return for periodic foot care and evaluation due to potential at risk complications.   Gardiner Barefoot DPM

## 2022-01-11 ENCOUNTER — Encounter (INDEPENDENT_AMBULATORY_CARE_PROVIDER_SITE_OTHER): Payer: Self-pay | Admitting: *Deleted

## 2022-01-14 DIAGNOSIS — E875 Hyperkalemia: Secondary | ICD-10-CM | POA: Diagnosis not present

## 2022-01-14 DIAGNOSIS — E1122 Type 2 diabetes mellitus with diabetic chronic kidney disease: Secondary | ICD-10-CM | POA: Diagnosis not present

## 2022-01-14 DIAGNOSIS — N189 Chronic kidney disease, unspecified: Secondary | ICD-10-CM | POA: Diagnosis not present

## 2022-01-14 DIAGNOSIS — R82991 Hypocitraturia: Secondary | ICD-10-CM | POA: Diagnosis not present

## 2022-01-14 DIAGNOSIS — N200 Calculus of kidney: Secondary | ICD-10-CM | POA: Diagnosis not present

## 2022-01-14 DIAGNOSIS — I129 Hypertensive chronic kidney disease with stage 1 through stage 4 chronic kidney disease, or unspecified chronic kidney disease: Secondary | ICD-10-CM | POA: Diagnosis not present

## 2022-01-25 ENCOUNTER — Encounter (INDEPENDENT_AMBULATORY_CARE_PROVIDER_SITE_OTHER): Payer: Self-pay

## 2022-01-25 ENCOUNTER — Telehealth (INDEPENDENT_AMBULATORY_CARE_PROVIDER_SITE_OTHER): Payer: Self-pay

## 2022-01-25 MED ORDER — PEG 3350-KCL-NA BICARB-NACL 420 G PO SOLR: 4000.0000 mL | ORAL | 0 refills | Status: DC

## 2022-01-25 NOTE — Telephone Encounter (Signed)
Marcus [name], CMA  ?

## 2022-01-25 NOTE — Telephone Encounter (Signed)
Referring MD/PCP: Stana Bunting  Procedure: Tcs  Reason/Indication:  Hx of polyps  Has patient had this procedure before?  Yes   If so, when, by whom and where?  6-7 years ago  Is there a family history of colon cancer?  No   Who?  What age when diagnosed?    Is patient diabetic? If yes, Type 1 or Type 2   yes type 2       Does patient have prosthetic heart valve or mechanical valve?  No   Do you have a pacemaker/defibrillator?  no  Has patient ever had endocarditis/atrial fibrillation? yes  Does patient use oxygen? No   Has patient had joint replacement within last 12 months?  No   Is patient constipated or do they take laxatives? No   Does patient have a history of alcohol/drug use?  No   Have you had a stroke/heart attack last 6 mths? No   Do you take medicine for weight loss?  No   For male patients,: have you had a hysterectomy n/a                      are you post menopausal n/a                      do you still have your menstrual cycle n/a  Is patient on blood thinner such as Coumadin, Plavix and/or Aspirin? yes  Medications: asa 81 mg daily, veltassa 8.4 g pack every mon & thurs, Metformin  500 mg bid, MVI daily, albuterol HFA 2 puffs prn, cyanocobalamin 500 mcg daily, glipizide 5 mg daily, metoprolol 25 mg 3 tablets bid, pantoprazole 40 mg ec daily, pravastatin 20 mg 1 daily  Allergies: nkda   Medication Adjustment per Dr Jenetta Downer Hold Metformin the evening prior and no diabetic meds the morning of procedure  Procedure date & time: 02/23/22 at 9:00

## 2022-01-25 NOTE — Telephone Encounter (Signed)
Ok to schedule.  Thanks,  Harvel Quale, MD Gastroenterology and Hepatology [facility]

## 2022-02-01 DIAGNOSIS — R82991 Hypocitraturia: Secondary | ICD-10-CM | POA: Diagnosis not present

## 2022-02-01 DIAGNOSIS — N200 Calculus of kidney: Secondary | ICD-10-CM | POA: Diagnosis not present

## 2022-02-01 DIAGNOSIS — I129 Hypertensive chronic kidney disease with stage 1 through stage 4 chronic kidney disease, or unspecified chronic kidney disease: Secondary | ICD-10-CM | POA: Diagnosis not present

## 2022-02-01 DIAGNOSIS — E875 Hyperkalemia: Secondary | ICD-10-CM | POA: Diagnosis not present

## 2022-02-01 DIAGNOSIS — N189 Chronic kidney disease, unspecified: Secondary | ICD-10-CM | POA: Diagnosis not present

## 2022-02-01 DIAGNOSIS — E1122 Type 2 diabetes mellitus with diabetic chronic kidney disease: Secondary | ICD-10-CM | POA: Diagnosis not present

## 2022-02-09 DIAGNOSIS — R82991 Hypocitraturia: Secondary | ICD-10-CM | POA: Diagnosis not present

## 2022-02-09 DIAGNOSIS — H26493 Other secondary cataract, bilateral: Secondary | ICD-10-CM | POA: Diagnosis not present

## 2022-02-09 DIAGNOSIS — Z961 Presence of intraocular lens: Secondary | ICD-10-CM | POA: Diagnosis not present

## 2022-02-09 DIAGNOSIS — N200 Calculus of kidney: Secondary | ICD-10-CM | POA: Diagnosis not present

## 2022-02-09 DIAGNOSIS — I129 Hypertensive chronic kidney disease with stage 1 through stage 4 chronic kidney disease, or unspecified chronic kidney disease: Secondary | ICD-10-CM | POA: Diagnosis not present

## 2022-02-09 DIAGNOSIS — E119 Type 2 diabetes mellitus without complications: Secondary | ICD-10-CM | POA: Diagnosis not present

## 2022-02-09 DIAGNOSIS — N189 Chronic kidney disease, unspecified: Secondary | ICD-10-CM | POA: Diagnosis not present

## 2022-02-09 DIAGNOSIS — E875 Hyperkalemia: Secondary | ICD-10-CM | POA: Diagnosis not present

## 2022-02-09 DIAGNOSIS — E1122 Type 2 diabetes mellitus with diabetic chronic kidney disease: Secondary | ICD-10-CM | POA: Diagnosis not present

## 2022-02-09 DIAGNOSIS — H35033 Hypertensive retinopathy, bilateral: Secondary | ICD-10-CM | POA: Diagnosis not present

## 2022-02-18 ENCOUNTER — Encounter (HOSPITAL_COMMUNITY)
Admission: RE | Admit: 2022-02-18 | Discharge: 2022-02-18 | Disposition: A | Discharge status: Home / Self Care | Payer: Medicare Other | Source: Ambulatory Visit | Attending: Gastroenterology | Admitting: Gastroenterology

## 2022-02-18 ENCOUNTER — Encounter (HOSPITAL_COMMUNITY): Payer: Self-pay

## 2022-02-23 ENCOUNTER — Ambulatory Visit (HOSPITAL_BASED_OUTPATIENT_CLINIC_OR_DEPARTMENT_OTHER): Payer: Medicare Other | Admitting: Anesthesiology

## 2022-02-23 ENCOUNTER — Encounter (HOSPITAL_COMMUNITY)
Admission: RE | Disposition: A | Discharge status: Home / Self Care | Payer: Self-pay | Source: Home / Self Care | Attending: Gastroenterology

## 2022-02-23 ENCOUNTER — Ambulatory Visit (HOSPITAL_COMMUNITY)
Admission: RE | Admit: 2022-02-23 | Discharge: 2022-02-23 | Disposition: A | Discharge status: Home / Self Care | Payer: Medicare Other | Attending: Gastroenterology | Admitting: Gastroenterology

## 2022-02-23 ENCOUNTER — Encounter (HOSPITAL_COMMUNITY): Payer: Self-pay | Admitting: Gastroenterology

## 2022-02-23 ENCOUNTER — Ambulatory Visit (HOSPITAL_COMMUNITY): Payer: Medicare Other | Admitting: Anesthesiology

## 2022-02-23 ENCOUNTER — Other Ambulatory Visit: Payer: Self-pay

## 2022-02-23 DIAGNOSIS — E1151 Type 2 diabetes mellitus with diabetic peripheral angiopathy without gangrene: Secondary | ICD-10-CM | POA: Diagnosis not present

## 2022-02-23 DIAGNOSIS — Z7984 Long term (current) use of oral hypoglycemic drugs: Secondary | ICD-10-CM | POA: Insufficient documentation

## 2022-02-23 DIAGNOSIS — K219 Gastro-esophageal reflux disease without esophagitis: Secondary | ICD-10-CM | POA: Insufficient documentation

## 2022-02-23 DIAGNOSIS — K635 Polyp of colon: Secondary | ICD-10-CM | POA: Diagnosis not present

## 2022-02-23 DIAGNOSIS — Z86010 Personal history of colonic polyps: Secondary | ICD-10-CM

## 2022-02-23 DIAGNOSIS — Z8669 Personal history of other diseases of the nervous system and sense organs: Secondary | ICD-10-CM | POA: Diagnosis not present

## 2022-02-23 DIAGNOSIS — Z800 Family history of malignant neoplasm of digestive organs: Secondary | ICD-10-CM | POA: Insufficient documentation

## 2022-02-23 DIAGNOSIS — I4891 Unspecified atrial fibrillation: Secondary | ICD-10-CM | POA: Insufficient documentation

## 2022-02-23 DIAGNOSIS — Z09 Encounter for follow-up examination after completed treatment for conditions other than malignant neoplasm: Secondary | ICD-10-CM | POA: Diagnosis not present

## 2022-02-23 DIAGNOSIS — Z87891 Personal history of nicotine dependence: Secondary | ICD-10-CM

## 2022-02-23 DIAGNOSIS — D120 Benign neoplasm of cecum: Secondary | ICD-10-CM | POA: Insufficient documentation

## 2022-02-23 DIAGNOSIS — D125 Benign neoplasm of sigmoid colon: Secondary | ICD-10-CM | POA: Insufficient documentation

## 2022-02-23 DIAGNOSIS — D124 Benign neoplasm of descending colon: Secondary | ICD-10-CM | POA: Insufficient documentation

## 2022-02-23 DIAGNOSIS — I10 Essential (primary) hypertension: Secondary | ICD-10-CM | POA: Insufficient documentation

## 2022-02-23 DIAGNOSIS — Z1211 Encounter for screening for malignant neoplasm of colon: Secondary | ICD-10-CM | POA: Insufficient documentation

## 2022-02-23 HISTORY — PX: COLONOSCOPY WITH PROPOFOL: SHX5780

## 2022-02-23 HISTORY — PX: POLYPECTOMY: SHX5525

## 2022-02-23 LAB — HM COLONOSCOPY

## 2022-02-23 SURGERY — COLONOSCOPY WITH PROPOFOL
Anesthesia: General

## 2022-02-23 MED ORDER — PROPOFOL 500 MG/50ML IV EMUL
INTRAVENOUS | Status: DC | PRN
  Administered 2022-02-23: 150 ug/kg/min via INTRAVENOUS

## 2022-02-23 MED ORDER — LIDOCAINE HCL 1 % IJ SOLN
INTRAMUSCULAR | Status: DC | PRN
  Administered 2022-02-23: 50 mg via INTRADERMAL

## 2022-02-23 MED ORDER — LACTATED RINGERS IV SOLN: INTRAVENOUS | Status: DC

## 2022-02-23 MED ORDER — PHENYLEPHRINE HCL (PRESSORS) 10 MG/ML IV SOLN
INTRAVENOUS | Status: DC | PRN
  Administered 2022-02-23 (×4): 80 ug via INTRAVENOUS

## 2022-02-23 MED ORDER — STERILE WATER FOR IRRIGATION IR SOLN
Status: DC | PRN
  Administered 2022-02-23: 180 mL

## 2022-02-23 MED ORDER — PROPOFOL 10 MG/ML IV BOLUS
INTRAVENOUS | Status: DC | PRN
  Administered 2022-02-23: 50 mg via INTRAVENOUS
  Administered 2022-02-23: 20 mg via INTRAVENOUS
  Administered 2022-02-23: 60 mg via INTRAVENOUS

## 2022-02-23 NOTE — Op Note (Signed)
[facility] Patient Name: Marcus [name] Procedure Date: 02/23/2022 8:41 AM MRN: [id number] Date of Birth: 1946-03-01 Attending MD: Maylon Peppers ,  CSN: [id number] Age: 75 Admit Type: Outpatient Procedure:                Colonoscopy Indications:              Surveillance: Personal history of adenomatous                            polyps on last colonoscopy > 5 years ago Providers:                Maylon Peppers, Lurline Del, RN, Kristine L. Risa Grill, Technician Referring MD:              Medicines:                Monitored Anesthesia Care Complications:            No immediate complications. Estimated Blood Loss:     Estimated blood loss: none. Procedure:                Pre-Anesthesia Assessment:                           - Prior to the procedure, a History and Physical                            was performed, and patient medications, allergies                            and sensitivities were reviewed. The patient's                            tolerance of previous anesthesia was reviewed.                           - The risks and benefits of the procedure and the                            sedation options and risks were discussed with the                            patient. All questions were answered and informed                            consent was obtained.                           - ASA Grade Assessment: II - A patient with mild                            systemic disease.                           After obtaining informed consent, the colonoscope  was passed under direct vision. Throughout the                            procedure, the patient's blood pressure, pulse, and                            oxygen saturations were monitored continuously. The                            [id number]-HQ190L ([id number]) scope was introduced through                            the anus and advanced to the the cecum, identified                             by appendiceal orifice and ileocecal valve. The                            colonoscopy was performed without difficulty. The                            patient tolerated the procedure well. The quality                            of the bowel preparation was good. Scope In: 9:03:42 AM Scope Out: 9:28:02 AM Scope Withdrawal Time: 0 hours 19 minutes 51 seconds  Total Procedure Duration: 0 hours 24 minutes 20 seconds  Findings:      The perianal and digital rectal examinations were normal.      Three sessile polyps were found in the sigmoid colon, descending colon       and cecum. The polyps were 6 to 10 mm in size. These polyps were removed       with a cold snare. Resection and retrieval were complete.      The retroflexed view of the distal rectum and anal verge was normal and       showed no anal or rectal abnormalities. Impression:               - Three 6 to 10 mm polyps in the sigmoid colon, in                            the descending colon and in the cecum, removed with                            a cold snare. Resected and retrieved.                           - The distal rectum and anal verge are normal on                            retroflexion view. Moderate Sedation:      Per Anesthesia Care Recommendation:           - Discharge patient to home (ambulatory).                           -  Resume previous diet.                           - Await pathology results.                           - Repeat colonoscopy for surveillance based on                            pathology results. Procedure Code(s):        --- Professional ---                           [phone], Colonoscopy, flexible; with removal of                            tumor(s), polyp(s), or other lesion(s) by snare                            technique Diagnosis Code(s):        --- Professional ---                           Z86.010, Personal history of colonic polyps                           K63.5, Polyp of  colon CPT copyright 2019 American Medical Association. All rights reserved. The codes documented in this report are preliminary and upon coder review may  be revised to meet current compliance requirements. Maylon Peppers, MD Maylon Peppers,  02/23/2022 9:34:23 AM This report has been signed electronically. Number of Addenda: 0

## 2022-02-23 NOTE — Anesthesia Preprocedure Evaluation (Signed)
Anesthesia Evaluation  Patient identified by MRN, date of birth, ID band Patient awake    Reviewed: Allergy & Precautions, H&P , NPO status , Patient's Chart, lab work & pertinent test results, reviewed documented beta blocker date and time   Airway Mallampati: II  TM Distance: >3 FB Neck ROM: full    Dental no notable dental hx.    Pulmonary neg pulmonary ROS, former smoker,    Pulmonary exam normal breath sounds clear to auscultation       Cardiovascular Exercise Tolerance: Good hypertension, + Peripheral Vascular Disease  + dysrhythmias Atrial Fibrillation  Rhythm:regular Rate:Normal     Neuro/Psych  Neuromuscular disease negative psych ROS   GI/Hepatic Neg liver ROS, GERD  Medicated,  Endo/Other  negative endocrine ROSdiabetes  Renal/GU negative Renal ROS  negative genitourinary   Musculoskeletal   Abdominal   Peds  Hematology negative hematology ROS (+)   Anesthesia Other Findings   Reproductive/Obstetrics negative OB ROS                             Anesthesia Physical Anesthesia Plan  ASA: 3  Anesthesia Plan: General   Post-op Pain Management:    Induction:   PONV Risk Score and Plan: Propofol infusion  Airway Management Planned:   Additional Equipment:   Intra-op Plan:   Post-operative Plan:   Informed Consent: I have reviewed the patients History and Physical, chart, labs and discussed the procedure including the risks, benefits and alternatives for the proposed anesthesia with the patient or authorized representative who has indicated his/her understanding and acceptance.     Dental Advisory Given  Plan Discussed with: CRNA  Anesthesia Plan Comments:         Anesthesia Quick Evaluation

## 2022-02-23 NOTE — Discharge Instructions (Signed)
You are being discharged to home.  Resume your previous diet.  We are waiting for your pathology results.  Your physician has recommended a repeat colonoscopy for surveillance based on pathology results.

## 2022-02-23 NOTE — H&P (Signed)
Marcus [name] is an 76 y.o. male.   Chief Complaint: History of colonic polyps HPI: 76 year old male with past medical history of atrial fibrillation, hypertension, GERD, Gilliam Barr syndrome, type 2 diabetes, coming for evaluation of history of colonic polyps.  Last colonoscopy performed in 2016, had 2 polyps removed endoscopically which were tubular adenomas.  The patient denies having any complaints such as melena, hematochezia, abdominal pain or distention, change in her bowel movement consistency or frequency, no changes in her weight recently.  Mother was diagnosed with colon cancer in her 40s.   Past Medical History:  Diagnosis Date   AF (atrial fibrillation) (HCC)    Atrial flutter (HCC)    Blue toes    chronic   Essential hypertension, benign 04/23/2019   GERD (gastroesophageal reflux disease)    Gout    Guillain Barr syndrome ([place])    Nuclear sclerotic cataract of left eye 05/07/2020   Nuclear sclerotic cataract of right eye 05/07/2020   Posterior subcapsular polar age-related cataract of left eye 05/07/2020   Type II diabetes mellitus, uncontrolled 04/23/2019    Past Surgical History:  Procedure Laterality Date   catheter ablation  12/23/2004   COLONOSCOPY N/A 09/18/2014   Procedure: COLONOSCOPY;  Surgeon: Rogene Houston, MD;  [facility];  Service: Endoscopy;  Laterality: N/A;  [place]     EYE SURGERY  2020   Bilateral cataract surgery   FEMUR IM NAIL Right 06/25/2013   Procedure: INTRAMEDULLARY (IM) RETROGRADE FEMORAL NAILING;  Surgeon: Marybelle Killings, MD;  Location: [place];  Service: Orthopedics;  Laterality: Right;    Family History  Problem Relation Age of Onset   Diabetes Father    Colon cancer Father    Colon cancer Mother    Social History:  reports that he quit smoking about 5 years ago. His smoking use included cigarettes. He started smoking about 5 years ago. He has a 10.00 pack-year smoking history. He has  never used smokeless tobacco. He reports that he does not drink alcohol and does not use drugs.  Allergies:  Allergies  Allergen Reactions   Influenza Vaccines Other (See Comments)    Guillain-Barre syndrome    Medications Prior to Admission  Medication Sig Dispense Refill   albuterol (VENTOLIN HFA) 108 (90 Base) MCG/ACT inhaler Inhale 2 puffs into the lungs every 6 (six) hours as needed for wheezing or shortness of breath. 8 g 2   aspirin EC 81 MG tablet Take 1 tablet (81 mg total) by mouth daily. 90 tablet 3   glipiZIDE (GLUCOTROL) 5 MG tablet Take 5 mg by mouth daily before breakfast.     glucose blood test strip Use to check your blood glucose once a day. 100 each 3   metFORMIN (GLUCOPHAGE-XR) 500 MG 24 hr tablet TAKE (1) TABLET TWICE DAILY. 60 tablet 1   metoprolol tartrate (LOPRESSOR) 25 MG tablet TAKE 3 TABLETS BY MOUTH TWICE DAILY. 180 tablet 6   pantoprazole (PROTONIX) 40 MG tablet TAKE 1 TABLET ONCE DAILY. 90 tablet 0   patiromer (VELTASSA) 8.4 g packet Take by mouth.     polyethylene glycol-electrolytes (TRILYTE) 420 g solution Take 4,000 mLs by mouth as directed. 4000 mL 0   pravastatin (PRAVACHOL) 20 MG tablet Take 1 tablet (20 mg total) by mouth daily. 90 tablet 0   vitamin B-12 (CYANOCOBALAMIN) 500 MCG tablet Take 1 tablet (500 mcg total) by mouth daily. 30 tablet 1    No results  found for this or any previous visit (from the past 48 hour(s)). No results found.  Review of Systems  All other systems reviewed and are negative.   Blood pressure (!) 130/97, pulse (!) 112, temperature 97.7 F (36.5 C), temperature source Oral, resp. rate 17, SpO2 100 %. Physical Exam  GENERAL: The patient is AO x3, in no acute distress. HEENT: Head is normocephalic and atraumatic. EOMI are intact. Mouth is well hydrated and without lesions. NECK: Supple. No masses LUNGS: Clear to auscultation. No presence of rhonchi/wheezing/rales. Adequate chest expansion HEART: RRR, normal s1 and  s2. ABDOMEN: Soft, nontender, no guarding, no peritoneal signs, and nondistended. BS +. No masses. EXTREMITIES: Without any cyanosis, clubbing, rash, lesions or edema. NEUROLOGIC: AOx3, no focal motor deficit. SKIN: no jaundice, no rashes  Assessment/Plan 76 year old male with past medical history of atrial fibrillation, hypertension, GERD, Gilliam Barr syndrome, type 2 diabetes, coming for evaluation of history of colonic polyps.  We will proceed with colonoscopy.  Harvel Quale, MD 02/23/2022, 8:53 AM

## 2022-02-23 NOTE — Transfer of Care (Signed)
Immediate Anesthesia Transfer of Care Note  Patient: Marcus [name]  Procedure(s) Performed: COLONOSCOPY WITH PROPOFOL POLYPECTOMY  Patient Location: Short Stay  Anesthesia Type:General  Level of Consciousness: awake  Airway & Oxygen Therapy: Patient Spontanous Breathing  Post-op Assessment: Report given to RN  Post vital signs: Reviewed and stable  Last Vitals:  Vitals  97/73 Value Taken Time  BP    Temp89    Pulse    Resp    SpO2      Last Pain:  Vitals:   02/23/22 0930  TempSrc: Oral  PainSc: 0-No pain         Complications: No notable events documented.

## 2022-02-23 NOTE — Anesthesia Postprocedure Evaluation (Signed)
Anesthesia Post Note  Patient: Nachman Sundt  Procedure(s) Performed: COLONOSCOPY WITH PROPOFOL POLYPECTOMY  Patient location during evaluation: Short Stay Anesthesia Type: General Level of consciousness: awake and alert Pain management: pain level controlled Vital Signs Assessment: post-procedure vital signs reviewed and stable Respiratory status: spontaneous breathing Cardiovascular status: blood pressure returned to baseline and stable Postop Assessment: no apparent nausea or vomiting Anesthetic complications: no   No notable events documented.   Last Vitals:  Vitals:   02/23/22 0810 02/23/22 0930  BP: (!) 130/97 96/73  Pulse: (!) 112 89  Resp: 17 14  Temp: 36.5 C (!) 36.3 C  SpO2: 100% 98%    Last Pain:  Vitals:   02/23/22 0930  TempSrc: Oral  PainSc: 0-No pain                 Cyle Kenyon

## 2022-02-24 ENCOUNTER — Encounter (INDEPENDENT_AMBULATORY_CARE_PROVIDER_SITE_OTHER): Payer: Self-pay | Admitting: *Deleted

## 2022-02-24 LAB — SURGICAL PATHOLOGY

## 2022-03-01 ENCOUNTER — Encounter (HOSPITAL_COMMUNITY): Payer: Self-pay | Admitting: Gastroenterology

## 2022-03-10 ENCOUNTER — Ambulatory Visit: Payer: Medicare Other | Admitting: Podiatry

## 2022-03-12 ENCOUNTER — Ambulatory Visit: Payer: Medicare Other | Admitting: Podiatry

## 2022-03-15 ENCOUNTER — Encounter: Payer: Self-pay | Admitting: Podiatry

## 2022-03-15 ENCOUNTER — Ambulatory Visit (INDEPENDENT_AMBULATORY_CARE_PROVIDER_SITE_OTHER): Payer: Medicare Other | Admitting: Podiatry

## 2022-03-15 DIAGNOSIS — B351 Tinea unguium: Secondary | ICD-10-CM

## 2022-03-15 DIAGNOSIS — E1142 Type 2 diabetes mellitus with diabetic polyneuropathy: Secondary | ICD-10-CM | POA: Diagnosis not present

## 2022-03-15 DIAGNOSIS — I739 Peripheral vascular disease, unspecified: Secondary | ICD-10-CM | POA: Diagnosis not present

## 2022-03-15 NOTE — Progress Notes (Signed)
This patient returns to my office for at risk foot care.  This patient requires this care by a professional since this patient will be at risk due to having diabetes and CKD.  This patient is unable to cut nails himself since the patient cannot reach his nails.These nails are painful walking and wearing shoes.  This patient presents for at risk foot care today.  General Appearance  Alert, conversant and in no acute stress.  Vascular  Dorsalis pedis and posterior tibial  pulses are weakly palpable  bilaterally.  Capillary return is within normal limits  bilaterally. Temperature is within normal limits  bilaterally.  Neurologic  Senn-Weinstein monofilament wire test within normal limits  bilaterally. Muscle power within normal limits bilaterally.  Nails Thick disfigured discolored nails with subungual debris  from hallux to fifth toes bilaterally. No evidence of bacterial infection or drainage bilaterally.  Orthopedic  No limitations of motion  feet .  No crepitus or effusions noted.  No bony pathology or digital deformities noted.  Skin  normotropic skin with no porokeratosis noted bilaterally.  No signs of infections or ulcers noted.     Onychomycosis  Pain in right toes  Pain in left toes  Consent was obtained for treatment procedures.   Mechanical debridement of nails 1-5  bilaterally performed with a nail nipper.  Filed with dremel without incident.    Return office visit    10 weeks                  Told patient to return for periodic foot care and evaluation due to potential at risk complications.   Gardiner Barefoot DPM

## 2022-04-18 ENCOUNTER — Other Ambulatory Visit: Payer: Self-pay | Admitting: Internal Medicine

## 2022-04-22 DIAGNOSIS — I129 Hypertensive chronic kidney disease with stage 1 through stage 4 chronic kidney disease, or unspecified chronic kidney disease: Secondary | ICD-10-CM | POA: Diagnosis not present

## 2022-04-22 DIAGNOSIS — D751 Secondary polycythemia: Secondary | ICD-10-CM | POA: Diagnosis not present

## 2022-04-22 DIAGNOSIS — N200 Calculus of kidney: Secondary | ICD-10-CM | POA: Diagnosis not present

## 2022-04-22 DIAGNOSIS — E1122 Type 2 diabetes mellitus with diabetic chronic kidney disease: Secondary | ICD-10-CM | POA: Diagnosis not present

## 2022-04-22 DIAGNOSIS — R82992 Hyperoxaluria: Secondary | ICD-10-CM | POA: Diagnosis not present

## 2022-04-22 DIAGNOSIS — N189 Chronic kidney disease, unspecified: Secondary | ICD-10-CM | POA: Diagnosis not present

## 2022-04-22 DIAGNOSIS — E875 Hyperkalemia: Secondary | ICD-10-CM | POA: Diagnosis not present

## 2022-04-22 DIAGNOSIS — R82991 Hypocitraturia: Secondary | ICD-10-CM | POA: Diagnosis not present

## 2022-04-29 DIAGNOSIS — I129 Hypertensive chronic kidney disease with stage 1 through stage 4 chronic kidney disease, or unspecified chronic kidney disease: Secondary | ICD-10-CM | POA: Diagnosis not present

## 2022-04-29 DIAGNOSIS — R82991 Hypocitraturia: Secondary | ICD-10-CM | POA: Diagnosis not present

## 2022-04-29 DIAGNOSIS — E1122 Type 2 diabetes mellitus with diabetic chronic kidney disease: Secondary | ICD-10-CM | POA: Diagnosis not present

## 2022-04-29 DIAGNOSIS — R809 Proteinuria, unspecified: Secondary | ICD-10-CM | POA: Diagnosis not present

## 2022-04-29 DIAGNOSIS — N200 Calculus of kidney: Secondary | ICD-10-CM | POA: Diagnosis not present

## 2022-04-29 DIAGNOSIS — E1129 Type 2 diabetes mellitus with other diabetic kidney complication: Secondary | ICD-10-CM | POA: Diagnosis not present

## 2022-04-29 DIAGNOSIS — N189 Chronic kidney disease, unspecified: Secondary | ICD-10-CM | POA: Diagnosis not present

## 2022-05-13 DIAGNOSIS — N200 Calculus of kidney: Secondary | ICD-10-CM | POA: Diagnosis not present

## 2022-05-13 DIAGNOSIS — R809 Proteinuria, unspecified: Secondary | ICD-10-CM | POA: Diagnosis not present

## 2022-05-13 DIAGNOSIS — I129 Hypertensive chronic kidney disease with stage 1 through stage 4 chronic kidney disease, or unspecified chronic kidney disease: Secondary | ICD-10-CM | POA: Diagnosis not present

## 2022-05-13 DIAGNOSIS — E1122 Type 2 diabetes mellitus with diabetic chronic kidney disease: Secondary | ICD-10-CM | POA: Diagnosis not present

## 2022-05-13 DIAGNOSIS — E1129 Type 2 diabetes mellitus with other diabetic kidney complication: Secondary | ICD-10-CM | POA: Diagnosis not present

## 2022-05-13 DIAGNOSIS — R82991 Hypocitraturia: Secondary | ICD-10-CM | POA: Diagnosis not present

## 2022-05-13 DIAGNOSIS — N189 Chronic kidney disease, unspecified: Secondary | ICD-10-CM | POA: Diagnosis not present

## 2022-05-17 ENCOUNTER — Encounter (INDEPENDENT_AMBULATORY_CARE_PROVIDER_SITE_OTHER): Payer: Medicare Other | Admitting: Ophthalmology

## 2022-05-17 ENCOUNTER — Encounter (INDEPENDENT_AMBULATORY_CARE_PROVIDER_SITE_OTHER): Payer: Self-pay

## 2022-05-17 DIAGNOSIS — H43813 Vitreous degeneration, bilateral: Secondary | ICD-10-CM | POA: Diagnosis not present

## 2022-05-17 DIAGNOSIS — H26491 Other secondary cataract, right eye: Secondary | ICD-10-CM | POA: Diagnosis not present

## 2022-05-17 DIAGNOSIS — E119 Type 2 diabetes mellitus without complications: Secondary | ICD-10-CM | POA: Diagnosis not present

## 2022-06-09 DIAGNOSIS — C4441 Basal cell carcinoma of skin of scalp and neck: Secondary | ICD-10-CM | POA: Diagnosis not present

## 2022-06-09 DIAGNOSIS — D485 Neoplasm of uncertain behavior of skin: Secondary | ICD-10-CM | POA: Diagnosis not present

## 2022-06-09 DIAGNOSIS — L570 Actinic keratosis: Secondary | ICD-10-CM | POA: Diagnosis not present

## 2022-06-10 DIAGNOSIS — K219 Gastro-esophageal reflux disease without esophagitis: Secondary | ICD-10-CM | POA: Diagnosis not present

## 2022-06-10 DIAGNOSIS — E1142 Type 2 diabetes mellitus with diabetic polyneuropathy: Secondary | ICD-10-CM | POA: Diagnosis not present

## 2022-06-10 DIAGNOSIS — I10 Essential (primary) hypertension: Secondary | ICD-10-CM | POA: Diagnosis not present

## 2022-06-10 DIAGNOSIS — I4891 Unspecified atrial fibrillation: Secondary | ICD-10-CM | POA: Diagnosis not present

## 2022-06-14 DIAGNOSIS — E785 Hyperlipidemia, unspecified: Secondary | ICD-10-CM | POA: Diagnosis not present

## 2022-06-14 DIAGNOSIS — K219 Gastro-esophageal reflux disease without esophagitis: Secondary | ICD-10-CM | POA: Diagnosis not present

## 2022-06-14 DIAGNOSIS — E1142 Type 2 diabetes mellitus with diabetic polyneuropathy: Secondary | ICD-10-CM | POA: Diagnosis not present

## 2022-06-14 DIAGNOSIS — I10 Essential (primary) hypertension: Secondary | ICD-10-CM | POA: Diagnosis not present

## 2022-06-14 DIAGNOSIS — R230 Cyanosis: Secondary | ICD-10-CM | POA: Diagnosis not present

## 2022-06-14 DIAGNOSIS — Z0001 Encounter for general adult medical examination with abnormal findings: Secondary | ICD-10-CM | POA: Diagnosis not present

## 2022-06-14 DIAGNOSIS — I83019 Varicose veins of right lower extremity with ulcer of unspecified site: Secondary | ICD-10-CM | POA: Diagnosis not present

## 2022-06-14 DIAGNOSIS — I739 Peripheral vascular disease, unspecified: Secondary | ICD-10-CM | POA: Diagnosis not present

## 2022-06-14 DIAGNOSIS — Z6826 Body mass index (BMI) 26.0-26.9, adult: Secondary | ICD-10-CM | POA: Diagnosis not present

## 2022-06-14 DIAGNOSIS — L97919 Non-pressure chronic ulcer of unspecified part of right lower leg with unspecified severity: Secondary | ICD-10-CM | POA: Diagnosis not present

## 2022-06-15 DIAGNOSIS — C4441 Basal cell carcinoma of skin of scalp and neck: Secondary | ICD-10-CM | POA: Diagnosis not present

## 2022-06-16 ENCOUNTER — Ambulatory Visit (INDEPENDENT_AMBULATORY_CARE_PROVIDER_SITE_OTHER): Payer: Medicare Other | Admitting: Podiatry

## 2022-06-16 ENCOUNTER — Encounter: Payer: Self-pay | Admitting: Podiatry

## 2022-06-16 DIAGNOSIS — B351 Tinea unguium: Secondary | ICD-10-CM

## 2022-06-16 DIAGNOSIS — E1142 Type 2 diabetes mellitus with diabetic polyneuropathy: Secondary | ICD-10-CM | POA: Diagnosis not present

## 2022-06-16 DIAGNOSIS — M79674 Pain in right toe(s): Secondary | ICD-10-CM | POA: Diagnosis not present

## 2022-06-16 DIAGNOSIS — G610 Guillain-Barre syndrome: Secondary | ICD-10-CM | POA: Diagnosis not present

## 2022-06-16 DIAGNOSIS — M79675 Pain in left toe(s): Secondary | ICD-10-CM

## 2022-06-16 DIAGNOSIS — I739 Peripheral vascular disease, unspecified: Secondary | ICD-10-CM | POA: Diagnosis not present

## 2022-06-16 NOTE — Progress Notes (Signed)
This patient returns to my office for at risk foot care.  This patient requires this care by a professional since this patient will be at risk due to having diabetes and CKD.  This patient is unable to cut nails himself since the patient cannot reach his nails.These nails are painful walking and wearing shoes.  This patient presents for at risk foot care today.  General Appearance  Alert, conversant and in no acute stress.  Vascular  Dorsalis pedis and posterior tibial  pulses are weakly palpable  bilaterally.  Capillary return is within normal limits  bilaterally. Temperature is within normal limits  bilaterally.  Neurologic  Senn-Weinstein monofilament wire test within normal limits  bilaterally. Muscle power within normal limits bilaterally.  Nails Thick disfigured discolored nails with subungual debris  from hallux to fifth toes bilaterally. No evidence of bacterial infection or drainage bilaterally.  Orthopedic  No limitations of motion  feet .  No crepitus or effusions noted.  No bony pathology or digital deformities noted.  Skin  normotropic skin with no porokeratosis noted bilaterally.  No signs of infections or ulcers noted.     Onychomycosis  Pain in right toes  Pain in left toes  Consent was obtained for treatment procedures.   Mechanical debridement of nails 1-5  bilaterally performed with a nail nipper.  Filed with dremel without incident. Patient to make an appointment for his circulation per his medical doctor.   Return office visit    10 weeks                  Told patient to return for periodic foot care and evaluation due to potential at risk complications.   Gardiner Barefoot DPM

## 2022-08-06 DIAGNOSIS — R809 Proteinuria, unspecified: Secondary | ICD-10-CM | POA: Diagnosis not present

## 2022-08-06 DIAGNOSIS — R82991 Hypocitraturia: Secondary | ICD-10-CM | POA: Diagnosis not present

## 2022-08-06 DIAGNOSIS — N200 Calculus of kidney: Secondary | ICD-10-CM | POA: Diagnosis not present

## 2022-08-06 DIAGNOSIS — E1122 Type 2 diabetes mellitus with diabetic chronic kidney disease: Secondary | ICD-10-CM | POA: Diagnosis not present

## 2022-08-06 DIAGNOSIS — N189 Chronic kidney disease, unspecified: Secondary | ICD-10-CM | POA: Diagnosis not present

## 2022-08-06 DIAGNOSIS — I129 Hypertensive chronic kidney disease with stage 1 through stage 4 chronic kidney disease, or unspecified chronic kidney disease: Secondary | ICD-10-CM | POA: Diagnosis not present

## 2022-08-06 DIAGNOSIS — E1129 Type 2 diabetes mellitus with other diabetic kidney complication: Secondary | ICD-10-CM | POA: Diagnosis not present

## 2022-08-17 ENCOUNTER — Other Ambulatory Visit: Payer: Self-pay | Admitting: Internal Medicine

## 2022-08-25 ENCOUNTER — Ambulatory Visit: Payer: Medicare Other | Admitting: Podiatry

## 2022-08-26 ENCOUNTER — Ambulatory Visit: Payer: Medicare Other | Attending: Internal Medicine | Admitting: Internal Medicine

## 2022-08-26 ENCOUNTER — Encounter: Payer: Self-pay | Admitting: Internal Medicine

## 2022-08-26 VITALS — BP 124/78 | HR 77 | Ht 72.0 in | Wt 194.6 lb

## 2022-08-26 DIAGNOSIS — I482 Chronic atrial fibrillation, unspecified: Secondary | ICD-10-CM | POA: Diagnosis not present

## 2022-08-26 NOTE — Progress Notes (Signed)
HPI Mr. Bahl returns today for followup. He is a pleasant 77 yo man with a h/o HTN, chronic atrial fib, DM, poorly controlled, who was in the hospital with GBS about 6 years ago. He is slowly getting stronger. He still refuses systemic anti-coagulation although I have encouraged him to reconsider. He admits to being very sedentary  Allergies  Allergen Reactions   Influenza Vaccines Other (See Comments)    Guillain-Barre syndrome     Current Outpatient Medications  Medication Sig Dispense Refill   albuterol (VENTOLIN HFA) 108 (90 Base) MCG/ACT inhaler Inhale 2 puffs into the lungs every 6 (six) hours as needed for wheezing or shortness of breath. 8 g 2   aspirin EC 81 MG tablet Take 1 tablet (81 mg total) by mouth daily. 90 tablet 3   glipiZIDE (GLUCOTROL) 5 MG tablet Take 5 mg by mouth daily before breakfast.     glucose blood test strip Use to check your blood glucose once a day. 100 each 3   metFORMIN (GLUCOPHAGE-XR) 500 MG 24 hr tablet TAKE (1) TABLET TWICE DAILY. 60 tablet 1   metoprolol tartrate (LOPRESSOR) 25 MG tablet Take 3 tablets (75 mg total) by mouth 2 (two) times daily. Must keep scheduled appointment for future refills. Thank you. 180 tablet 0   pantoprazole (PROTONIX) 40 MG tablet TAKE 1 TABLET ONCE DAILY. 90 tablet 0   pravastatin (PRAVACHOL) 20 MG tablet Take 1 tablet (20 mg total) by mouth daily. 90 tablet 0   telmisartan (MICARDIS) 20 MG tablet Take by mouth.     vitamin B-12 (CYANOCOBALAMIN) 500 MCG tablet Take 1 tablet (500 mcg total) by mouth daily. 30 tablet 1   No current facility-administered medications for this visit.     Past Medical History:  Diagnosis Date   AF (atrial fibrillation) (HCC)    Atrial flutter (HCC)    Blue toes    chronic   Essential hypertension, benign 04/23/2019   GERD (gastroesophageal reflux disease)    Gout    Guillain Barr syndrome ([place])    Nuclear sclerotic cataract of left eye 05/07/2020   Nuclear sclerotic  cataract of right eye 05/07/2020   Posterior subcapsular polar age-related cataract of left eye 05/07/2020   Type II diabetes mellitus, uncontrolled 04/23/2019    ROS:   All systems reviewed and negative except as noted in the HPI.   Past Surgical History:  Procedure Laterality Date   catheter ablation  12/23/2004   COLONOSCOPY N/A 09/18/2014   Procedure: COLONOSCOPY;  Surgeon: Rogene Houston, MD;  [facility];  Service: Endoscopy;  Laterality: N/A;  830   COLONOSCOPY WITH PROPOFOL N/A 02/23/2022   Procedure: COLONOSCOPY WITH PROPOFOL;  Surgeon: Harvel Quale, MD;  [facility];  Service: Gastroenterology;  Laterality: N/A;  900 ASA 2   CORONARY ANGIOPLASTY     EYE SURGERY  2020   Bilateral cataract surgery   FEMUR IM NAIL Right 06/25/2013   Procedure: INTRAMEDULLARY (IM) RETROGRADE FEMORAL NAILING;  Surgeon: Marybelle Killings, MD;  Location: [place];  Service: Orthopedics;  Laterality: Right;   POLYPECTOMY  02/23/2022   Procedure: POLYPECTOMY;  Surgeon: Harvel Quale, MD;  [facility];  Service: Gastroenterology;;  cecal;     Family History  Problem Relation Age of Onset   Diabetes Father    Colon cancer Father    Colon cancer Mother      Social History   Socioeconomic History  Marital status: Married    Spouse name: Not on file   Number of children: Not on file   Years of education: Not on file   Highest education level: Not on file  Occupational History   Not on file  Tobacco Use   Smoking status: Former    Packs/day: 2.00    Years: 5.00    Total pack years: 10.00    Types: Cigarettes    Start date: 11/23/2016    Quit date: 11/24/2016    Years since quitting: 5.7   Smokeless tobacco: Never  Vaping Use   Vaping Use: Never used  Substance and Sexual Activity   Alcohol use: No   Drug use: No   Sexual activity: Not on file  Other Topics Concern   Not on file  Social History Narrative   Married for 63  years,lives with wife.Retired.   Social Determinants of Health   Financial Resource Strain: Not on file  Food Insecurity: Not on file  Transportation Needs: Not on file  Physical Activity: Not on file  Stress: Not on file  Social Connections: Not on file  Intimate Partner Violence: Not on file     BP 124/78   Pulse 77   Ht 6' (1.829 m)   Wt 194 lb 9.6 oz (88.3 kg)   SpO2 96%   BMI 26.39 kg/m   Physical Exam:  Well appearing NAD HEENT: Unremarkable Neck:  No JVD, no thyromegally Lymphatics:  No adenopathy Back:  No CVA tenderness Lungs:  Clear HEART:  Regular rate rhythm, no murmurs, no rubs, no clicks Abd:  soft, positive bowel sounds, no organomegally, no rebound, no guarding Ext:  2 plus pulses, no edema, no cyanosis, no clubbing Skin:  No rashes no nodules Neuro:  CN II through XII intact, motor grossly intact  DEVICE  Normal device function.  See PaceArt for details.   Assess/Plan:  Atrial fib - his VR is still controlled. He still refuses systemic anti-coagulation. HTN - his bp is fairly well controlled.  Dyslipidemia - he will continue pravachol   Carleene Overlie Sujay Grundman,MD

## 2022-08-26 NOTE — Patient Instructions (Signed)
Medication Instructions:  Your physician recommends that you continue on your current medications as directed. Please refer to the Current Medication list given to you today.  *If you need a refill on your cardiac medications before your next appointment, please call your pharmacy*   Lab Work: NONE   If you have labs (blood work) drawn today and your tests are completely normal, you will receive your results only by: [place] (if you have MyChart) OR A paper copy in the mail If you have any lab test that is abnormal or we need to change your treatment, we will call you to review the results.   Testing/Procedures: NONE    Follow-Up: At [facility], you and your health needs are our priority.  As part of our continuing mission to provide you with exceptional heart care, we have created designated Provider Care Teams.  These Care Teams include your primary Cardiologist (physician) and Advanced Practice Providers (APPs -  Physician Assistants and Nurse Practitioners) who all work together to provide you with the care you need, when you need it.  We recommend signing up for the patient portal called "MyChart".  Sign up information is provided on this After Visit Summary.  MyChart is used to connect with patients for Virtual Visits (Telemedicine).  Patients are able to view lab/test results, encounter notes, upcoming appointments, etc.  Non-urgent messages can be sent to your provider as well.   To learn more about what you can do with MyChart, go to [email or web address].    Your next appointment:   2 year(s)  Provider:   Cristopher Peru, MD    Other Instructions Thank you for choosing [facility]!

## 2022-09-01 ENCOUNTER — Encounter: Payer: Self-pay | Admitting: Podiatry

## 2022-09-01 ENCOUNTER — Ambulatory Visit (INDEPENDENT_AMBULATORY_CARE_PROVIDER_SITE_OTHER): Payer: Medicare Other | Admitting: Podiatry

## 2022-09-01 DIAGNOSIS — M79674 Pain in right toe(s): Secondary | ICD-10-CM | POA: Diagnosis not present

## 2022-09-01 DIAGNOSIS — B351 Tinea unguium: Secondary | ICD-10-CM

## 2022-09-01 DIAGNOSIS — I739 Peripheral vascular disease, unspecified: Secondary | ICD-10-CM

## 2022-09-01 DIAGNOSIS — M79675 Pain in left toe(s): Secondary | ICD-10-CM

## 2022-09-01 NOTE — Progress Notes (Signed)
  Subjective:  Patient ID: Marcus [name], male    DOB: 22-Sep-1945,  MRN: [id number]  Marcus [name] presents to clinic today for at risk foot care. Patient has history of NIDDM, neuropathy, PAD and GBS.  He is seen for painful elongated mycotic toenails 1-5 bilaterally which are tender when wearing enclosed shoe gear. Pain is relieved with periodic professional debridement.  He relates no new problems.He continues to be followed by Vascular Surgery for his PAD. Last visit with Dr.Dickson in January. At that visit he has injury to left great toe and this has completely resolved.  PCP is [place] Nation, MD , and last visit was 09/28/2021.  Allergies  Allergen Reactions   Influenza Vaccines Other (See Comments)    Guillain-Barre syndrome    Review of Systems: Negative except as noted in the HPI. Objective:   Constitutional Marcus [name] is a pleasant 77 y.o. Caucasian male, WD, WN in NAD. AAO x 3.   Vascular CFT <3 seconds b/l LE. Palpable DP pulse(s) left lower extremity Palpable PT pulse(s) right lower extremity Diminished DP pulse(s) right lower extremity. Diminished PT pulse(s) left lower extremity. Pedal hair absent. No pain with calf compression b/l. Lower extremity skin temperature gradient warm to cool. Cyanosis present bilateral lower extremities. Trace edema noted BLE.  Neurologic Normal speech. Oriented to person, place, and time. Protective sensation diminished with 10g monofilament b/l. Vibratory sensation diminished b/l.  Dermatologic Healing abrasion left 5th digit with no ischemia. Pedal skin thin, shiny and atrophic b/l LE. No open wounds b/l LE. No interdigital macerations noted b/l LE. Toenails 1-5 b/l elongated, discolored, dystrophic, thickened, crumbly with subungual debris and tenderness to dorsal palpation. Hyperkeratotic lesion(s) submet head 5 left foot.  No erythema, no edema, no drainage, no fluctuance. Porokeratotic lesion(s) submet  head 5 right foot. No erythema, no edema, no drainage, no fluctuance.  Orthopedic: Normal muscle strength 5/5 to all lower extremity muscle groups bilaterally. HAV with bunion deformity noted b/l LE. Hammertoe deformity noted 2-5 b/l.Marland Kitchen No pain, crepitus or joint limitation noted with ROM b/l LE.  Patient ambulates independently without assistive aids.   Radiographs: None  Last A1c:      No data to display         Assessment:   No diagnosis found.  Plan:  Patient was evaluated and treated and all questions answered. Consent given for treatment as described below: Left hallux injury resolved. He has healing abrasion left 5th digit. He knows to call Dr.Dickson if he has any problems with this. -Mycotic toenails 1-5 bilaterally were debrided in length and girth with sterile nail nippers and dremel without incident. -Callus(es) submet head 5 left foot pared utilizing mandrel sander without complication or incident. Total number debrided =1. -Painful porokeratotic lesion(s) submet head 5 right foot pared and enucleated with sterile scalpel blade without incident. Total number of lesions debrided=1. -Patient to report any pedal injuries to medical professional immediately. -Patient/POA to call should there be question/concern in the interim.  Return in about 3 months (around 11/30/2022).  Felipa Furnace, DPM

## 2022-09-02 DIAGNOSIS — N189 Chronic kidney disease, unspecified: Secondary | ICD-10-CM | POA: Diagnosis not present

## 2022-09-02 DIAGNOSIS — I129 Hypertensive chronic kidney disease with stage 1 through stage 4 chronic kidney disease, or unspecified chronic kidney disease: Secondary | ICD-10-CM | POA: Diagnosis not present

## 2022-09-02 DIAGNOSIS — E1129 Type 2 diabetes mellitus with other diabetic kidney complication: Secondary | ICD-10-CM | POA: Diagnosis not present

## 2022-09-02 DIAGNOSIS — R82991 Hypocitraturia: Secondary | ICD-10-CM | POA: Diagnosis not present

## 2022-09-02 DIAGNOSIS — E1122 Type 2 diabetes mellitus with diabetic chronic kidney disease: Secondary | ICD-10-CM | POA: Diagnosis not present

## 2022-09-02 DIAGNOSIS — N200 Calculus of kidney: Secondary | ICD-10-CM | POA: Diagnosis not present

## 2022-09-02 DIAGNOSIS — R809 Proteinuria, unspecified: Secondary | ICD-10-CM | POA: Diagnosis not present

## 2022-09-07 DIAGNOSIS — I10 Essential (primary) hypertension: Secondary | ICD-10-CM | POA: Diagnosis not present

## 2022-09-07 DIAGNOSIS — K219 Gastro-esophageal reflux disease without esophagitis: Secondary | ICD-10-CM | POA: Diagnosis not present

## 2022-09-07 DIAGNOSIS — E1142 Type 2 diabetes mellitus with diabetic polyneuropathy: Secondary | ICD-10-CM | POA: Diagnosis not present

## 2022-09-14 DIAGNOSIS — Z23 Encounter for immunization: Secondary | ICD-10-CM | POA: Diagnosis not present

## 2022-09-14 DIAGNOSIS — E785 Hyperlipidemia, unspecified: Secondary | ICD-10-CM | POA: Diagnosis not present

## 2022-09-14 DIAGNOSIS — K219 Gastro-esophageal reflux disease without esophagitis: Secondary | ICD-10-CM | POA: Diagnosis not present

## 2022-09-14 DIAGNOSIS — I83019 Varicose veins of right lower extremity with ulcer of unspecified site: Secondary | ICD-10-CM | POA: Diagnosis not present

## 2022-09-14 DIAGNOSIS — Z6826 Body mass index (BMI) 26.0-26.9, adult: Secondary | ICD-10-CM | POA: Diagnosis not present

## 2022-09-14 DIAGNOSIS — R230 Cyanosis: Secondary | ICD-10-CM | POA: Diagnosis not present

## 2022-09-14 DIAGNOSIS — Z0001 Encounter for general adult medical examination with abnormal findings: Secondary | ICD-10-CM | POA: Diagnosis not present

## 2022-09-14 DIAGNOSIS — I10 Essential (primary) hypertension: Secondary | ICD-10-CM | POA: Diagnosis not present

## 2022-09-14 DIAGNOSIS — E1142 Type 2 diabetes mellitus with diabetic polyneuropathy: Secondary | ICD-10-CM | POA: Diagnosis not present

## 2022-09-14 DIAGNOSIS — L97919 Non-pressure chronic ulcer of unspecified part of right lower leg with unspecified severity: Secondary | ICD-10-CM | POA: Diagnosis not present

## 2022-09-14 DIAGNOSIS — I739 Peripheral vascular disease, unspecified: Secondary | ICD-10-CM | POA: Diagnosis not present

## 2022-09-16 ENCOUNTER — Ambulatory Visit (INDEPENDENT_AMBULATORY_CARE_PROVIDER_SITE_OTHER): Payer: Medicare Other | Admitting: Vascular Surgery

## 2022-09-16 ENCOUNTER — Other Ambulatory Visit: Payer: Self-pay | Admitting: Internal Medicine

## 2022-09-16 ENCOUNTER — Other Ambulatory Visit: Payer: Self-pay | Admitting: *Deleted

## 2022-09-16 ENCOUNTER — Ambulatory Visit (HOSPITAL_COMMUNITY)
Admission: RE | Admit: 2022-09-16 | Discharge: 2022-09-16 | Disposition: A | Discharge status: Home / Self Care | Payer: Medicare Other | Source: Ambulatory Visit | Attending: Vascular Surgery | Admitting: Vascular Surgery

## 2022-09-16 ENCOUNTER — Encounter: Payer: Self-pay | Admitting: Vascular Surgery

## 2022-09-16 VITALS — BP 135/84 | HR 74 | Temp 98.1°F | Resp 20 | Ht 72.0 in | Wt 195.0 lb

## 2022-09-16 DIAGNOSIS — I739 Peripheral vascular disease, unspecified: Secondary | ICD-10-CM | POA: Diagnosis not present

## 2022-09-16 LAB — VAS US ABI WITH/WO TBI
Left ABI: 1.27
Right ABI: 1.13

## 2022-09-16 NOTE — Progress Notes (Signed)
REASON FOR VISIT:   Follow-up of peripheral arterial disease.  MEDICAL ISSUES:   PERIPHERAL ARTERIAL DISEASE: This patient has evidence of some mild infrainguinal arterial occlusive disease bilaterally.  However he has good Doppler signals in both feet and is asymptomatic.  He does have acrocyanosis but I have reassured him that he has palpable pedal pulses and good perfusion.  He is not a smoker.  I encouraged him to stay as active as possible.  I think it would be reasonable to see him as needed.  He will call if he develops any significant claudication, rest pain, or nonhealing wounds.  HPI:   Marcus [name] is a pleasant 77 y.o. male who I last saw on 08/06/2021 with peripheral arterial disease.  He had evidence of infrainguinal arterial occlusive disease bilaterally.  However he was asymptomatic.  He was not a smoker.  He comes in for a 1 year follow-up visit.  Since I saw the patient last, he denies any history of claudication, rest pain, or nonhealing ulcers.  He quit smoking in 1965.  He is on aspirin and is on a statin.   Past Medical History:  Diagnosis Date   AF (atrial fibrillation) (HCC)    Atrial flutter (HCC)    Blue toes    chronic   Essential hypertension, benign 04/23/2019   GERD (gastroesophageal reflux disease)    Gout    Guillain Barr syndrome ([place])    Nuclear sclerotic cataract of left eye 05/07/2020   Nuclear sclerotic cataract of right eye 05/07/2020   Posterior subcapsular polar age-related cataract of left eye 05/07/2020   Type II diabetes mellitus, uncontrolled 04/23/2019    Family History  Problem Relation Age of Onset   Diabetes Father    Colon cancer Father    Colon cancer Mother     SOCIAL HISTORY: Social History   Tobacco Use   Smoking status: Former    Packs/day: 2.00    Years: 5.00    Total pack years: 10.00    Types: Cigarettes    Start date: 11/23/2016    Quit date: 11/24/2016    Years since quitting: 5.8   Smokeless  tobacco: Never  Substance Use Topics   Alcohol use: No    Allergies  Allergen Reactions   Influenza Vaccines Other (See Comments)    Guillain-Barre syndrome    Current Outpatient Medications  Medication Sig Dispense Refill   albuterol (VENTOLIN HFA) 108 (90 Base) MCG/ACT inhaler Inhale 2 puffs into the lungs every 6 (six) hours as needed for wheezing or shortness of breath. 8 g 2   aspirin EC 81 MG tablet Take 1 tablet (81 mg total) by mouth daily. 90 tablet 3   glipiZIDE (GLUCOTROL) 5 MG tablet Take 5 mg by mouth daily before breakfast.     glucose blood test strip Use to check your blood glucose once a day. 100 each 3   metoprolol tartrate (LOPRESSOR) 25 MG tablet Take 3 tablets (75 mg total) by mouth 2 (two) times daily. Must keep scheduled appointment for future refills. Thank you. 180 tablet 0   pantoprazole (PROTONIX) 40 MG tablet TAKE 1 TABLET ONCE DAILY. 90 tablet 0   pravastatin (PRAVACHOL) 20 MG tablet Take 1 tablet (20 mg total) by mouth daily. 90 tablet 0   telmisartan (MICARDIS) 20 MG tablet Take by mouth.     vitamin B-12 (CYANOCOBALAMIN) 500 MCG tablet Take 1 tablet (500 mcg total) by mouth daily. 30 tablet 1  metFORMIN (GLUCOPHAGE-XR) 500 MG 24 hr tablet TAKE (1) TABLET TWICE DAILY. 60 tablet 1   No current facility-administered medications for this visit.    REVIEW OF SYSTEMS:  [X]$  denotes positive finding, [ ]$  denotes negative finding Cardiac  Comments:  Chest pain or chest pressure:    Shortness of breath upon exertion:    Short of breath when lying flat:    Irregular heart rhythm:        Vascular    Pain in calf, thigh, or hip brought on by ambulation:    Pain in feet at night that wakes you up from your sleep:     Blood clot in your veins:    Leg swelling:         Pulmonary    Oxygen at home:    Productive cough:     Wheezing:         Neurologic    Sudden weakness in arms or legs:     Sudden numbness in arms or legs:     Sudden onset of  difficulty speaking or slurred speech:    Temporary loss of vision in one eye:     Problems with dizziness:         Gastrointestinal    Blood in stool:     Vomited blood:         Genitourinary    Burning when urinating:     Blood in urine:        Psychiatric    Major depression:         Hematologic    Bleeding problems:    Problems with blood clotting too easily:        Skin    Rashes or ulcers:        Constitutional    Fever or chills:     PHYSICAL EXAM:   Vitals:   09/16/22 0945  BP: 135/84  Pulse: 74  Resp: 20  Temp: 98.1 F (36.7 C)  SpO2: 94%  Weight: 195 lb (88.5 kg)  Height: 6' (1.829 m)    GENERAL: The patient is a well-nourished male, in no acute distress. The vital signs are documented above. CARDIAC: There is a regular rate and rhythm.  VASCULAR: I do not detect carotid bruits. On the right side he has a palpable femoral, popliteal, and posterior tibial pulse. On the left side he has a palpable femoral, popliteal, and dorsalis pedis pulse. PULMONARY: There is good air exchange bilaterally without wheezing or rales. ABDOMEN: Soft and non-tender with normal pitched bowel sounds.  MUSCULOSKELETAL: There are no major deformities or cyanosis. NEUROLOGIC: No focal weakness or paresthesias are detected. SKIN: There are no ulcers or rashes noted. PSYCHIATRIC: The patient has a normal affect.  DATA:    ARTERIAL DOPPLER STUDY: I have independently interpreted his arterial Doppler study today.  On the right side there is a biphasic posterior tibial signal with a monophasic dorsalis pedis signal.  ABI is 100%.  Toe pressures 95 mmHg.  On the left side there is a biphasic posterior tibial and dorsalis pedis signal.  ABI is 100%.  Toe pressure is 123 mmHg.  Deitra Mayo [facility] [phone]

## 2022-10-14 DIAGNOSIS — I10 Essential (primary) hypertension: Secondary | ICD-10-CM | POA: Diagnosis not present

## 2022-10-14 DIAGNOSIS — E1142 Type 2 diabetes mellitus with diabetic polyneuropathy: Secondary | ICD-10-CM | POA: Diagnosis not present

## 2022-10-14 DIAGNOSIS — R230 Cyanosis: Secondary | ICD-10-CM | POA: Diagnosis not present

## 2022-10-14 DIAGNOSIS — I83019 Varicose veins of right lower extremity with ulcer of unspecified site: Secondary | ICD-10-CM | POA: Diagnosis not present

## 2022-10-14 DIAGNOSIS — Z0001 Encounter for general adult medical examination with abnormal findings: Secondary | ICD-10-CM | POA: Diagnosis not present

## 2022-10-14 DIAGNOSIS — Z6826 Body mass index (BMI) 26.0-26.9, adult: Secondary | ICD-10-CM | POA: Diagnosis not present

## 2022-10-14 DIAGNOSIS — I739 Peripheral vascular disease, unspecified: Secondary | ICD-10-CM | POA: Diagnosis not present

## 2022-10-14 DIAGNOSIS — K219 Gastro-esophageal reflux disease without esophagitis: Secondary | ICD-10-CM | POA: Diagnosis not present

## 2022-10-14 DIAGNOSIS — L97919 Non-pressure chronic ulcer of unspecified part of right lower leg with unspecified severity: Secondary | ICD-10-CM | POA: Diagnosis not present

## 2022-10-14 DIAGNOSIS — E785 Hyperlipidemia, unspecified: Secondary | ICD-10-CM | POA: Diagnosis not present

## 2022-11-18 DIAGNOSIS — E1142 Type 2 diabetes mellitus with diabetic polyneuropathy: Secondary | ICD-10-CM | POA: Diagnosis not present

## 2022-11-18 DIAGNOSIS — I10 Essential (primary) hypertension: Secondary | ICD-10-CM | POA: Diagnosis not present

## 2022-11-18 DIAGNOSIS — L97919 Non-pressure chronic ulcer of unspecified part of right lower leg with unspecified severity: Secondary | ICD-10-CM | POA: Diagnosis not present

## 2022-11-18 DIAGNOSIS — I83019 Varicose veins of right lower extremity with ulcer of unspecified site: Secondary | ICD-10-CM | POA: Diagnosis not present

## 2022-11-18 DIAGNOSIS — E785 Hyperlipidemia, unspecified: Secondary | ICD-10-CM | POA: Diagnosis not present

## 2022-11-18 DIAGNOSIS — Z6826 Body mass index (BMI) 26.0-26.9, adult: Secondary | ICD-10-CM | POA: Diagnosis not present

## 2022-11-18 DIAGNOSIS — I739 Peripheral vascular disease, unspecified: Secondary | ICD-10-CM | POA: Diagnosis not present

## 2022-11-18 DIAGNOSIS — R230 Cyanosis: Secondary | ICD-10-CM | POA: Diagnosis not present

## 2022-11-18 DIAGNOSIS — K219 Gastro-esophageal reflux disease without esophagitis: Secondary | ICD-10-CM | POA: Diagnosis not present

## 2022-12-01 ENCOUNTER — Ambulatory Visit (INDEPENDENT_AMBULATORY_CARE_PROVIDER_SITE_OTHER): Payer: Medicare Other | Admitting: Podiatry

## 2022-12-01 DIAGNOSIS — M79674 Pain in right toe(s): Secondary | ICD-10-CM | POA: Diagnosis not present

## 2022-12-01 DIAGNOSIS — B351 Tinea unguium: Secondary | ICD-10-CM | POA: Diagnosis not present

## 2022-12-01 DIAGNOSIS — M79675 Pain in left toe(s): Secondary | ICD-10-CM | POA: Diagnosis not present

## 2022-12-01 DIAGNOSIS — I739 Peripheral vascular disease, unspecified: Secondary | ICD-10-CM

## 2022-12-01 MED ORDER — CICLOPIROX 8 % EX SOLN: Freq: Every day | CUTANEOUS | 0 refills | Status: AC

## 2022-12-01 NOTE — Progress Notes (Signed)
  Subjective:  Patient ID: Marcus [name], male    DOB: 1945/09/19,  MRN: [id number]  Eamonn Woolard presents to clinic today for at risk foot care. Patient has history of NIDDM, neuropathy, PAD and GBS.  He is seen for painful elongated mycotic toenails 1-5 bilaterally which are tender when wearing enclosed shoe gear. Pain is relieved with periodic professional debridement.  He relates no new problems.He continues to be followed by Vascular Surgery for his PAD. Last visit with Dr.Dickson in January. At that visit he has injury to left great toe and this has completely resolved.  PCP is Donetta Potts, MD , and last visit was 09/28/2021.  Allergies  Allergen Reactions   Influenza Vaccines Other (See Comments)    Guillain-Barre syndrome    Review of Systems: Negative except as noted in the HPI. Objective:   Constitutional Catherine Sama is a pleasant 77 y.o. Caucasian male, WD, WN in NAD. AAO x 3.   Vascular CFT <3 seconds b/l LE. Palpable DP pulse(s) left lower extremity Palpable PT pulse(s) right lower extremity Diminished DP pulse(s) right lower extremity. Diminished PT pulse(s) left lower extremity. Pedal hair absent. No pain with calf compression b/l. Lower extremity skin temperature gradient warm to cool. Cyanosis present bilateral lower extremities. Trace edema noted BLE.  Neurologic Normal speech. Oriented to person, place, and time. Protective sensation diminished with 10g monofilament b/l. Vibratory sensation diminished b/l.  Dermatologic Healing abrasion left 5th digit with no ischemia. Pedal skin thin, shiny and atrophic b/l LE. No open wounds b/l LE. No interdigital macerations noted b/l LE. Toenails 1-5 b/l elongated, discolored, dystrophic, thickened, crumbly with subungual debris and tenderness to dorsal palpation. Hyperkeratotic lesion(s) submet head 5 left foot.  No erythema, no edema, no drainage, no fluctuance. Porokeratotic lesion(s) submet  head 5 right foot. No erythema, no edema, no drainage, no fluctuance.  Orthopedic: Normal muscle strength 5/5 to all lower extremity muscle groups bilaterally. HAV with bunion deformity noted b/l LE. Hammertoe deformity noted 2-5 b/l.Marland Kitchen No pain, crepitus or joint limitation noted with ROM b/l LE.  Patient ambulates independently without assistive aids.   Radiographs: None  Last A1c:      No data to display         Assessment:   1. Pain due to onychomycosis of toenails of both feet   2. PAD (peripheral artery disease) (HCC)     Plan:  Patient was evaluated and treated and all questions answered. Consent given for treatment as described below: Left hallux injury resolved. He has healing abrasion left 5th digit. He knows to call Dr.Dickson if he has any problems with this. -Mycotic toenails 1-5 bilaterally were debrided in length and girth with sterile nail nippers and dremel without incident. -Callus(es) submet head 5 left foot pared utilizing mandrel sander without complication or incident. Total number debrided =1. -Painful porokeratotic lesion(s) submet head 5 right foot pared and enucleated with sterile scalpel blade without incident. Total number of lesions debrided=1. -Patient to report any pedal injuries to medical professional immediately. -Patient/POA to call should there be question/concern in the interim.  No follow-ups on file.  Candelaria Stagers, DPM

## 2022-12-23 DIAGNOSIS — K219 Gastro-esophageal reflux disease without esophagitis: Secondary | ICD-10-CM | POA: Diagnosis not present

## 2022-12-23 DIAGNOSIS — E1142 Type 2 diabetes mellitus with diabetic polyneuropathy: Secondary | ICD-10-CM | POA: Diagnosis not present

## 2022-12-29 DIAGNOSIS — R82991 Hypocitraturia: Secondary | ICD-10-CM | POA: Diagnosis not present

## 2022-12-29 DIAGNOSIS — R809 Proteinuria, unspecified: Secondary | ICD-10-CM | POA: Diagnosis not present

## 2022-12-29 DIAGNOSIS — I129 Hypertensive chronic kidney disease with stage 1 through stage 4 chronic kidney disease, or unspecified chronic kidney disease: Secondary | ICD-10-CM | POA: Diagnosis not present

## 2022-12-29 DIAGNOSIS — N189 Chronic kidney disease, unspecified: Secondary | ICD-10-CM | POA: Diagnosis not present

## 2022-12-29 DIAGNOSIS — Z79899 Other long term (current) drug therapy: Secondary | ICD-10-CM | POA: Diagnosis not present

## 2022-12-29 DIAGNOSIS — E1122 Type 2 diabetes mellitus with diabetic chronic kidney disease: Secondary | ICD-10-CM | POA: Diagnosis not present

## 2022-12-29 DIAGNOSIS — N200 Calculus of kidney: Secondary | ICD-10-CM | POA: Diagnosis not present

## 2022-12-30 DIAGNOSIS — E785 Hyperlipidemia, unspecified: Secondary | ICD-10-CM | POA: Diagnosis not present

## 2022-12-30 DIAGNOSIS — R230 Cyanosis: Secondary | ICD-10-CM | POA: Diagnosis not present

## 2022-12-30 DIAGNOSIS — E1142 Type 2 diabetes mellitus with diabetic polyneuropathy: Secondary | ICD-10-CM | POA: Diagnosis not present

## 2022-12-30 DIAGNOSIS — I10 Essential (primary) hypertension: Secondary | ICD-10-CM | POA: Diagnosis not present

## 2022-12-30 DIAGNOSIS — I739 Peripheral vascular disease, unspecified: Secondary | ICD-10-CM | POA: Diagnosis not present

## 2022-12-30 DIAGNOSIS — L97919 Non-pressure chronic ulcer of unspecified part of right lower leg with unspecified severity: Secondary | ICD-10-CM | POA: Diagnosis not present

## 2022-12-30 DIAGNOSIS — K219 Gastro-esophageal reflux disease without esophagitis: Secondary | ICD-10-CM | POA: Diagnosis not present

## 2022-12-30 DIAGNOSIS — Z6825 Body mass index (BMI) 25.0-25.9, adult: Secondary | ICD-10-CM | POA: Diagnosis not present

## 2022-12-30 DIAGNOSIS — I83019 Varicose veins of right lower extremity with ulcer of unspecified site: Secondary | ICD-10-CM | POA: Diagnosis not present

## 2022-12-30 DIAGNOSIS — E875 Hyperkalemia: Secondary | ICD-10-CM | POA: Diagnosis not present

## 2023-01-03 DIAGNOSIS — N200 Calculus of kidney: Secondary | ICD-10-CM | POA: Diagnosis not present

## 2023-01-03 DIAGNOSIS — R82991 Hypocitraturia: Secondary | ICD-10-CM | POA: Diagnosis not present

## 2023-01-03 DIAGNOSIS — E1122 Type 2 diabetes mellitus with diabetic chronic kidney disease: Secondary | ICD-10-CM | POA: Diagnosis not present

## 2023-01-03 DIAGNOSIS — E875 Hyperkalemia: Secondary | ICD-10-CM | POA: Diagnosis not present

## 2023-01-05 DIAGNOSIS — I10 Essential (primary) hypertension: Secondary | ICD-10-CM | POA: Diagnosis not present

## 2023-01-05 DIAGNOSIS — I739 Peripheral vascular disease, unspecified: Secondary | ICD-10-CM | POA: Diagnosis not present

## 2023-01-05 DIAGNOSIS — I83019 Varicose veins of right lower extremity with ulcer of unspecified site: Secondary | ICD-10-CM | POA: Diagnosis not present

## 2023-01-05 DIAGNOSIS — L97919 Non-pressure chronic ulcer of unspecified part of right lower leg with unspecified severity: Secondary | ICD-10-CM | POA: Diagnosis not present

## 2023-01-05 DIAGNOSIS — R230 Cyanosis: Secondary | ICD-10-CM | POA: Diagnosis not present

## 2023-01-05 DIAGNOSIS — E875 Hyperkalemia: Secondary | ICD-10-CM | POA: Diagnosis not present

## 2023-01-05 DIAGNOSIS — E785 Hyperlipidemia, unspecified: Secondary | ICD-10-CM | POA: Diagnosis not present

## 2023-01-05 DIAGNOSIS — Z6825 Body mass index (BMI) 25.0-25.9, adult: Secondary | ICD-10-CM | POA: Diagnosis not present

## 2023-01-05 DIAGNOSIS — K219 Gastro-esophageal reflux disease without esophagitis: Secondary | ICD-10-CM | POA: Diagnosis not present

## 2023-01-05 DIAGNOSIS — E1142 Type 2 diabetes mellitus with diabetic polyneuropathy: Secondary | ICD-10-CM | POA: Diagnosis not present

## 2023-01-12 DIAGNOSIS — E1142 Type 2 diabetes mellitus with diabetic polyneuropathy: Secondary | ICD-10-CM | POA: Diagnosis not present

## 2023-01-12 DIAGNOSIS — I10 Essential (primary) hypertension: Secondary | ICD-10-CM | POA: Diagnosis not present

## 2023-01-18 ENCOUNTER — Other Ambulatory Visit: Payer: Self-pay | Admitting: Internal Medicine

## 2023-01-20 DIAGNOSIS — I739 Peripheral vascular disease, unspecified: Secondary | ICD-10-CM | POA: Diagnosis not present

## 2023-01-20 DIAGNOSIS — Z6825 Body mass index (BMI) 25.0-25.9, adult: Secondary | ICD-10-CM | POA: Diagnosis not present

## 2023-01-20 DIAGNOSIS — L97919 Non-pressure chronic ulcer of unspecified part of right lower leg with unspecified severity: Secondary | ICD-10-CM | POA: Diagnosis not present

## 2023-01-20 DIAGNOSIS — E875 Hyperkalemia: Secondary | ICD-10-CM | POA: Diagnosis not present

## 2023-01-20 DIAGNOSIS — E1142 Type 2 diabetes mellitus with diabetic polyneuropathy: Secondary | ICD-10-CM | POA: Diagnosis not present

## 2023-01-20 DIAGNOSIS — I10 Essential (primary) hypertension: Secondary | ICD-10-CM | POA: Diagnosis not present

## 2023-01-20 DIAGNOSIS — R230 Cyanosis: Secondary | ICD-10-CM | POA: Diagnosis not present

## 2023-01-20 DIAGNOSIS — I83019 Varicose veins of right lower extremity with ulcer of unspecified site: Secondary | ICD-10-CM | POA: Diagnosis not present

## 2023-01-20 DIAGNOSIS — K219 Gastro-esophageal reflux disease without esophagitis: Secondary | ICD-10-CM | POA: Diagnosis not present

## 2023-01-20 DIAGNOSIS — E785 Hyperlipidemia, unspecified: Secondary | ICD-10-CM | POA: Diagnosis not present

## 2023-01-26 ENCOUNTER — Encounter: Payer: Self-pay | Admitting: Internal Medicine

## 2023-01-26 ENCOUNTER — Other Ambulatory Visit: Payer: Self-pay | Admitting: Internal Medicine

## 2023-01-26 ENCOUNTER — Ambulatory Visit: Payer: Medicare Other | Attending: Internal Medicine

## 2023-01-26 ENCOUNTER — Ambulatory Visit: Payer: Medicare Other | Attending: Internal Medicine | Admitting: Internal Medicine

## 2023-01-26 VITALS — BP 112/88 | HR 92 | Ht 72.0 in | Wt 187.9 lb

## 2023-01-26 DIAGNOSIS — I739 Peripheral vascular disease, unspecified: Secondary | ICD-10-CM | POA: Diagnosis not present

## 2023-01-26 DIAGNOSIS — I4891 Unspecified atrial fibrillation: Secondary | ICD-10-CM

## 2023-01-26 NOTE — Patient Instructions (Signed)
Medication Instructions:   Continue all current medications.    Labwork:  none  Testing/Procedures:  Your physician has recommended that you wear a 3 day event monitor. Event monitors are medical devices that record the heart's electrical activity. Doctors most often [place] these monitors to diagnose arrhythmias. Arrhythmias are problems with the speed or rhythm of the heartbeat. The monitor is a small, portable device. You can wear one while you do your normal daily activities. This is usually used to diagnose what is causing palpitations/syncope (passing out). Office will contact with results via phone, letter or mychart.     Follow-Up:  6 weeks    Any Other Special Instructions Will Be Listed Below (If Applicable).   If you need a refill on your cardiac medications before your next appointment, please call your pharmacy.

## 2023-01-26 NOTE — Progress Notes (Signed)
HPI [name] returns today for followup. He is a pleasant 77 yo man with a h/o atrial fib and obviated to rate control. He has refused systemic anti-coagulation. The patient has started sleeping 12 hours a day and using a lift chair. He has not had syncope but c/o being weak. He has PND. No chest pain.  Allergies  Allergen Reactions   Influenza Vaccines Other (See Comments)    Guillain-Barre syndrome     Current Outpatient Medications  Medication Sig Dispense Refill   aspirin EC 81 MG tablet Take 1 tablet (81 mg total) by mouth daily. 90 tablet 3   ciclopirox (PENLAC) 8 % solution Apply topically at bedtime. Apply over nail and surrounding skin. Apply daily over previous coat. After seven (7) days, may remove with alcohol and continue cycle. 6.6 mL 0   glipiZIDE (GLUCOTROL) 5 MG tablet Take 5 mg by mouth daily before breakfast.     glucose blood test strip Use to check your blood glucose once a day. 100 each 3   metoprolol tartrate (LOPRESSOR) 25 MG tablet TAKE 3 TABLETS BY MOUTH TWICE DAILY. 180 tablet 2   pravastatin (PRAVACHOL) 20 MG tablet Take 1 tablet (20 mg total) by mouth daily. 90 tablet 0   vitamin B-12 (CYANOCOBALAMIN) 500 MCG tablet Take 1 tablet (500 mcg total) by mouth daily. 30 tablet 1   albuterol (VENTOLIN HFA) 108 (90 Base) MCG/ACT inhaler Inhale 2 puffs into the lungs every 6 (six) hours as needed for wheezing or shortness of breath. (Patient not taking: Reported on 01/26/2023) 8 g 2   metFORMIN (GLUCOPHAGE-XR) 500 MG 24 hr tablet TAKE (1) TABLET TWICE DAILY. 60 tablet 1   pantoprazole (PROTONIX) 40 MG tablet TAKE 1 TABLET ONCE DAILY. (Patient not taking: Reported on 01/26/2023) 90 tablet 0   telmisartan (MICARDIS) 20 MG tablet Take by mouth. (Patient not taking: Reported on 01/26/2023)     No current facility-administered medications for this visit.     Past Medical History:  Diagnosis Date   AF (atrial fibrillation) (HCC)    Atrial flutter (HCC)    Blue  toes    chronic   Essential hypertension, benign 04/23/2019   GERD (gastroesophageal reflux disease)    Gout    Guillain Barr syndrome (HCC)    Nuclear sclerotic cataract of left eye 05/07/2020   Nuclear sclerotic cataract of right eye 05/07/2020   Posterior subcapsular polar age-related cataract of left eye 05/07/2020   Type II diabetes mellitus, uncontrolled 04/23/2019    ROS:   All systems reviewed and negative except as noted in the HPI.   Past Surgical History:  Procedure Laterality Date   catheter ablation  12/23/2004   COLONOSCOPY N/A 09/18/2014   Procedure: COLONOSCOPY;  Surgeon: Malissa Hippo, MD;  Location: [facility];  Service: Endoscopy;  Laterality: N/A;  830   COLONOSCOPY WITH PROPOFOL N/A 02/23/2022   Procedure: COLONOSCOPY WITH PROPOFOL;  Surgeon: Dolores Frame, MD;  Location: [facility];  Service: Gastroenterology;  Laterality: N/A;  900 ASA 2   CORONARY ANGIOPLASTY     EYE SURGERY  2020   Bilateral cataract surgery   FEMUR IM NAIL Right 06/25/2013   Procedure: INTRAMEDULLARY (IM) RETROGRADE FEMORAL NAILING;  Surgeon: Eldred Manges, MD;  Location: [facility] OR;  Service: Orthopedics;  Laterality: Right;   POLYPECTOMY  02/23/2022   Procedure: POLYPECTOMY;  Surgeon: Dolores Frame, MD;  Location: [facility];  Service: Gastroenterology;;  cecal;  Family History  Problem Relation Age of Onset   Diabetes Father    Colon cancer Father    Colon cancer Mother      Social History   Socioeconomic History   Marital status: Married    Spouse name: Not on file   Number of children: Not on file   Years of education: Not on file   Highest education level: Not on file  Occupational History   Not on file  Tobacco Use   Smoking status: Former    Packs/day: 2.00    Years: 5.00    Additional pack years: 0.00    Total pack years: 10.00    Types: Cigarettes    Quit date: 07/27/1963    Years since quitting: 59.5   Smokeless tobacco: Never   Vaping Use   Vaping Use: Never used  Substance and Sexual Activity   Alcohol use: No   Drug use: No   Sexual activity: Not on file  Other Topics Concern   Not on file  Social History Narrative   Married for 51 years,lives with wife.Retired.   Social Determinants of Health   Financial Resource Strain: Not on file  Food Insecurity: Not on file  Transportation Needs: Not on file  Physical Activity: Not on file  Stress: Not on file  Social Connections: Not on file  Intimate Partner Violence: Not on file     BP 112/88 (BP Location: Left Arm, Patient Position: Sitting, Cuff Size: Normal)   Pulse 92   Ht 6' (1.829 m)   Wt 187 lb 14.4 oz (85.2 kg)   SpO2 100%   BMI 25.48 kg/m   Physical Exam:  Well appearing NAD HEENT: Unremarkable Neck:  No JVD, no thyromegally Lymphatics:  No adenopathy Back:  No CVA tenderness Lungs:  Clear with no wheezes HEART:  Regular rate rhythm, no murmurs, no rubs, no clicks Abd:  soft, positive bowel sounds, no organomegally, no rebound, no guarding Ext:  2 plus pulses, no edema, no cyanosis, no clubbing Skin:  No rashes no nodules Neuro:  CN II through XII intact, motor grossly intact  EKG - atrial fib with a controlled VR   Assess/Plan: Atrial fib - his VR may or may not be controlled. I suspect that it is not. I have asked him to wear a 3 day zio monitor. Additional rate control will be based on the results of his heart monitor. Coags - he is still refusing systemic anti-coag. Weakness - He is very sedentary. I encouraged him to start walking or to get in the pool at the [facility].  Sharlot Gowda Adalynne Steffensmeier,MD

## 2023-01-31 ENCOUNTER — Other Ambulatory Visit: Payer: Self-pay | Admitting: Internal Medicine

## 2023-01-31 ENCOUNTER — Ambulatory Visit: Payer: Medicare Other | Attending: Internal Medicine

## 2023-01-31 DIAGNOSIS — I4891 Unspecified atrial fibrillation: Secondary | ICD-10-CM

## 2023-02-02 DIAGNOSIS — I4891 Unspecified atrial fibrillation: Secondary | ICD-10-CM | POA: Diagnosis not present

## 2023-02-14 DIAGNOSIS — H26493 Other secondary cataract, bilateral: Secondary | ICD-10-CM | POA: Diagnosis not present

## 2023-02-14 DIAGNOSIS — Z961 Presence of intraocular lens: Secondary | ICD-10-CM | POA: Diagnosis not present

## 2023-02-14 DIAGNOSIS — E119 Type 2 diabetes mellitus without complications: Secondary | ICD-10-CM | POA: Diagnosis not present

## 2023-02-14 DIAGNOSIS — H35362 Drusen (degenerative) of macula, left eye: Secondary | ICD-10-CM | POA: Diagnosis not present

## 2023-02-15 DIAGNOSIS — I4891 Unspecified atrial fibrillation: Secondary | ICD-10-CM | POA: Diagnosis not present

## 2023-02-22 ENCOUNTER — Telehealth: Payer: Self-pay

## 2023-02-22 DIAGNOSIS — I4891 Unspecified atrial fibrillation: Secondary | ICD-10-CM

## 2023-02-22 DIAGNOSIS — Z79899 Other long term (current) drug therapy: Secondary | ICD-10-CM

## 2023-02-22 MED ORDER — DIGOXIN 125 MCG PO TABS: 0.1250 mg | ORAL_TABLET | Freq: Every day | ORAL | 2 refills | Status: AC

## 2023-02-22 NOTE — Telephone Encounter (Signed)
Monitor results discussed with patient. He will begin Digoxin 0.125 mg daily and repeat EKG on 02/25/23 plus have Digoxin level drawn then.

## 2023-02-22 NOTE — Telephone Encounter (Signed)
-----   Message from Lewayne Bunting sent at 02/21/2023  1:17 PM EDT ----- Ask him to start digoxin 0.125 mg daily, and check an ECG in 3 weeks as well as a digoxin level.

## 2023-02-23 DIAGNOSIS — E1142 Type 2 diabetes mellitus with diabetic polyneuropathy: Secondary | ICD-10-CM | POA: Diagnosis not present

## 2023-02-23 DIAGNOSIS — I10 Essential (primary) hypertension: Secondary | ICD-10-CM | POA: Diagnosis not present

## 2023-02-23 DIAGNOSIS — E785 Hyperlipidemia, unspecified: Secondary | ICD-10-CM | POA: Diagnosis not present

## 2023-03-04 ENCOUNTER — Ambulatory Visit (INDEPENDENT_AMBULATORY_CARE_PROVIDER_SITE_OTHER): Payer: Medicare Other | Admitting: Podiatry

## 2023-03-04 DIAGNOSIS — M79675 Pain in left toe(s): Secondary | ICD-10-CM | POA: Diagnosis not present

## 2023-03-04 DIAGNOSIS — B351 Tinea unguium: Secondary | ICD-10-CM | POA: Diagnosis not present

## 2023-03-04 DIAGNOSIS — I739 Peripheral vascular disease, unspecified: Secondary | ICD-10-CM

## 2023-03-04 DIAGNOSIS — M79674 Pain in right toe(s): Secondary | ICD-10-CM | POA: Diagnosis not present

## 2023-03-04 NOTE — Progress Notes (Signed)
  Subjective:  Patient ID: Marcus [name], male    DOB: [birth year],  MRN: [id number]  Marcus [name] presents to clinic today for at risk foot care. Patient has history of NIDDM, neuropathy, PAD and GBS.  He is seen for painful elongated mycotic toenails 1-5 bilaterally which are tender when wearing enclosed shoe gear. Pain is relieved with periodic professional debridement.  He relates no new problems.He continues to be followed by Vascular Surgery for his PAD. Last visit with Dr.Dickson in January. At that visit he has injury to left great toe and this has completely resolved.  PCP is Donetta Potts, MD , and last visit was 09/28/2021.  Allergies  Allergen Reactions   Influenza Vaccines Other (See Comments)    Guillain-Barre syndrome    Review of Systems: Negative except as noted in the HPI. Objective:   Constitutional Marcus [name] is a pleasant 77 y.o. Caucasian male, WD, WN in NAD. AAO x 3.   Vascular CFT <3 seconds b/l LE. Palpable DP pulse(s) left lower extremity Palpable PT pulse(s) right lower extremity Diminished DP pulse(s) right lower extremity. Diminished PT pulse(s) left lower extremity. Pedal hair absent. No pain with calf compression b/l. Lower extremity skin temperature gradient warm to cool. Cyanosis present bilateral lower extremities. Trace edema noted BLE.  Neurologic Normal speech. Oriented to person, place, and time. Protective sensation diminished with 10g monofilament b/l. Vibratory sensation diminished b/l.  Dermatologic Healing abrasion left 5th digit with no ischemia. Pedal skin thin, shiny and atrophic b/l LE. No open wounds b/l LE. No interdigital macerations noted b/l LE. Toenails 1-5 b/l elongated, discolored, dystrophic, thickened, crumbly with subungual debris and tenderness to dorsal palpation. Hyperkeratotic lesion(s) submet head 5 left foot.  No erythema, no edema, no drainage, no fluctuance. Porokeratotic lesion(s) submet  head 5 right foot. No erythema, no edema, no drainage, no fluctuance.  Orthopedic: Normal muscle strength 5/5 to all lower extremity muscle groups bilaterally. HAV with bunion deformity noted b/l LE. Hammertoe deformity noted 2-5 b/l.Marland Kitchen No pain, crepitus or joint limitation noted with ROM b/l LE.  Patient ambulates independently without assistive aids.   Radiographs: None  Last A1c:      No data to display         Assessment:   No diagnosis found.  Plan:  Patient was evaluated and treated and all questions answered. Consent given for treatment as described below: Left hallux injury resolved. He has healing abrasion left 5th digit. He knows to call Dr.Dickson if he has any problems with this. -Mycotic toenails 1-5 bilaterally were debrided in length and girth with sterile nail nippers and dremel without incident. -Callus(es) submet head 5 left foot pared utilizing mandrel sander without complication or incident. Total number debrided =1. -Painful porokeratotic lesion(s) submet head 5 right foot pared and enucleated with sterile scalpel blade without incident. Total number of lesions debrided=1. -Patient to report any pedal injuries to medical professional immediately. -Patient/POA to call should there be question/concern in the interim.  No follow-ups on file.  Candelaria Stagers, DPM

## 2023-03-07 DIAGNOSIS — L570 Actinic keratosis: Secondary | ICD-10-CM | POA: Diagnosis not present

## 2023-03-08 DIAGNOSIS — I4891 Unspecified atrial fibrillation: Secondary | ICD-10-CM | POA: Diagnosis not present

## 2023-03-10 ENCOUNTER — Ambulatory Visit: Payer: Medicare Other | Attending: Internal Medicine | Admitting: Internal Medicine

## 2023-03-10 VITALS — BP 118/64 | HR 83 | Ht 72.0 in | Wt 184.4 lb

## 2023-03-10 DIAGNOSIS — I4811 Longstanding persistent atrial fibrillation: Secondary | ICD-10-CM | POA: Diagnosis not present

## 2023-03-10 NOTE — Patient Instructions (Signed)
Medication Instructions:  Your physician recommends that you continue on your current medications as directed. Please refer to the Current Medication list given to you today.   *If you need a refill on your cardiac medications before your next appointment, please call your pharmacy*   Lab Work: NONE   If you have labs (blood work) drawn today and your tests are completely normal, you will receive your results only by: MyChart Message (if you have MyChart) OR A paper copy in the mail If you have any lab test that is abnormal or we need to change your treatment, we will call you to review the results.   Testing/Procedures: NONE    Follow-Up: At [facility], you and your health needs are our priority.  As part of our continuing mission to provide you with exceptional heart care, we have created designated Provider Care Teams.  These Care Teams include your primary Cardiologist (physician) and Advanced Practice Providers (APPs -  Physician Assistants and Nurse Practitioners) who all work together to provide you with the care you need, when you need it.  We recommend signing up for the patient portal called "MyChart".  Sign up information is provided on this After Visit Summary.  MyChart is used to connect with patients for Virtual Visits (Telemedicine).  Patients are able to view lab/test results, encounter notes, upcoming appointments, etc.  Non-urgent messages can be sent to your provider as well.   To learn more about what you can do with MyChart, go to [email or web address].    Your next appointment:    Pending Zio monitor results   Provider:   Lewayne Bunting, MD    Other Instructions Thank you for choosing [facility]!

## 2023-03-10 NOTE — Progress Notes (Signed)
HPI [name] returns today for followup. He is a pleasant 77 yo man with a h/o atrial fib and rate control. He has refused systemic anti-coagulation. The patient has started sleeping 12 hours a day and using a lift chair. He has not had syncope but c/o being weak. He has PND. No chest pain.  Allergies  Allergen Reactions   Influenza Vaccines Other (See Comments)    Guillain-Barre syndrome     Current Outpatient Medications  Medication Sig Dispense Refill   aspirin EC 81 MG tablet Take 1 tablet (81 mg total) by mouth daily. 90 tablet 3   ciclopirox (PENLAC) 8 % solution Apply topically at bedtime. Apply over nail and surrounding skin. Apply daily over previous coat. After seven (7) days, may remove with alcohol and continue cycle. 6.6 mL 0   digoxin (LANOXIN) 0.125 MG tablet Take 1 tablet (0.125 mg total) by mouth daily. 90 tablet 2   glipiZIDE (GLUCOTROL) 5 MG tablet Take 5 mg by mouth daily before breakfast.     glucose blood test strip Use to check your blood glucose once a day. 100 each 3   metFORMIN (GLUCOPHAGE-XR) 500 MG 24 hr tablet TAKE (1) TABLET TWICE DAILY. 60 tablet 1   metoprolol tartrate (LOPRESSOR) 25 MG tablet TAKE 3 TABLETS BY MOUTH TWICE DAILY. 180 tablet 2   pantoprazole (PROTONIX) 40 MG tablet TAKE 1 TABLET ONCE DAILY. 90 tablet 0   pravastatin (PRAVACHOL) 20 MG tablet Take 1 tablet (20 mg total) by mouth daily. 90 tablet 0   telmisartan (MICARDIS) 20 MG tablet Take by mouth.     vitamin B-12 (CYANOCOBALAMIN) 500 MCG tablet Take 1 tablet (500 mcg total) by mouth daily. 30 tablet 1   albuterol (VENTOLIN HFA) 108 (90 Base) MCG/ACT inhaler Inhale 2 puffs into the lungs every 6 (six) hours as needed for wheezing or shortness of breath. (Patient not taking: Reported on 01/26/2023) 8 g 2   No current facility-administered medications for this visit.     Past Medical History:  Diagnosis Date   AF (atrial fibrillation) (HCC)    Atrial flutter (HCC)    Blue toes     chronic   Essential hypertension, benign 04/23/2019   GERD (gastroesophageal reflux disease)    Gout    Guillain Barr syndrome (HCC)    Nuclear sclerotic cataract of left eye 05/07/2020   Nuclear sclerotic cataract of right eye 05/07/2020   Posterior subcapsular polar age-related cataract of left eye 05/07/2020   Type II diabetes mellitus, uncontrolled 04/23/2019    ROS:   All systems reviewed and negative except as noted in the HPI.   Past Surgical History:  Procedure Laterality Date   catheter ablation  12/23/2004   COLONOSCOPY N/A 09/18/2014   Procedure: COLONOSCOPY;  Surgeon: Malissa Hippo, MD;  Location: [facility];  Service: Endoscopy;  Laterality: N/A;  830   COLONOSCOPY WITH PROPOFOL N/A 02/23/2022   Procedure: COLONOSCOPY WITH PROPOFOL;  Surgeon: Dolores Frame, MD;  Location: [facility];  Service: Gastroenterology;  Laterality: N/A;  900 ASA 2   CORONARY ANGIOPLASTY     EYE SURGERY  2020   Bilateral cataract surgery   FEMUR IM NAIL Right 06/25/2013   Procedure: INTRAMEDULLARY (IM) RETROGRADE FEMORAL NAILING;  Surgeon: Eldred Manges, MD;  Location: [facility];  Service: Orthopedics;  Laterality: Right;   POLYPECTOMY  02/23/2022   Procedure: POLYPECTOMY;  Surgeon: Dolores Frame, MD;  Location: [facility];  Service: Gastroenterology;;  cecal;     Family History  Problem Relation Age of Onset   Diabetes Father    Colon cancer Father    Colon cancer Mother      Social History   Socioeconomic History   Marital status: Married    Spouse name: Not on file   Number of children: Not on file   Years of education: Not on file   Highest education level: Not on file  Occupational History   Not on file  Tobacco Use   Smoking status: Former    Current packs/day: 0.00    Average packs/day: 2.0 packs/day for 5.0 years (10.0 ttl pk-yrs)    Types: Cigarettes    Start date: 07/26/1958    Quit date: 07/27/1963    Years since quitting: 59.6    Smokeless tobacco: Never  Vaping Use   Vaping status: Never Used  Substance and Sexual Activity   Alcohol use: No   Drug use: No   Sexual activity: Not on file  Other Topics Concern   Not on file  Social History Narrative   Married for 51 years,lives with wife.Retired.   Social Determinants of Health   Financial Resource Strain: Not on file  Food Insecurity: Not on file  Transportation Needs: Not on file  Physical Activity: Not on file  Stress: Not on file  Social Connections: Not on file  Intimate Partner Violence: Not on file     BP 118/64   Pulse 83   Ht 6' (1.829 m)   Wt 184 lb 6.4 oz (83.6 kg)   SpO2 96%   BMI 25.01 kg/m   Physical Exam:  Well appearing NAD HEENT: Unremarkable Neck:  No JVD, no thyromegally Lymphatics:  No adenopathy Back:  No CVA tenderness Lungs:  Clear HEART:  Regular rate rhythm, no murmurs, no rubs, no clicks Abd:  soft, positive bowel sounds, no organomegally, no rebound, no guarding Ext:  2 plus pulses, no edema, no cyanosis, no clubbing Skin:  No rashes no nodules Neuro:  CN II through XII intact, motor grossly intact   Assess/Plan:  Atrial fib - his VR appears to be controlled after starting digoxin. Continue Coags - he is still refusing systemic anti-coag. Weakness - He is very sedentary. I encouraged him to start walking or to get in the pool at the [facility]. Insomnia - he goes to be around 1800. I encouraged him to stay up much later and get up at 0700.    Sharlot Gowda ,MD

## 2023-03-16 ENCOUNTER — Other Ambulatory Visit (HOSPITAL_COMMUNITY)
Admission: RE | Admit: 2023-03-16 | Discharge: 2023-03-16 | Disposition: A | Discharge status: Home / Self Care | Payer: Medicare Other | Source: Ambulatory Visit | Attending: Internal Medicine | Admitting: Internal Medicine

## 2023-03-16 ENCOUNTER — Ambulatory Visit (INDEPENDENT_AMBULATORY_CARE_PROVIDER_SITE_OTHER): Payer: Medicare Other

## 2023-03-16 VITALS — BP 130/74 | HR 77

## 2023-03-16 DIAGNOSIS — I4892 Unspecified atrial flutter: Secondary | ICD-10-CM

## 2023-03-16 DIAGNOSIS — Z79899 Other long term (current) drug therapy: Secondary | ICD-10-CM | POA: Insufficient documentation

## 2023-03-16 DIAGNOSIS — I4891 Unspecified atrial fibrillation: Secondary | ICD-10-CM | POA: Diagnosis not present

## 2023-03-16 LAB — DIGOXIN LEVEL: Digoxin Level: 1.2 ng/mL (ref 0.8–2.0)

## 2023-03-16 NOTE — Progress Notes (Signed)
Nurse visit today  Digoxin level drawn,EKG obtained, A-fib rate 77  I will forward to Dr.Taylor for review

## 2023-03-16 NOTE — Patient Instructions (Signed)
Continue medications as directed.  We will call you after Dr.Taylor reviews your EKG and lab work.

## 2023-04-01 ENCOUNTER — Encounter: Payer: Self-pay | Admitting: Internal Medicine

## 2023-04-01 ENCOUNTER — Telehealth: Payer: Self-pay | Admitting: Internal Medicine

## 2023-04-01 DIAGNOSIS — E875 Hyperkalemia: Secondary | ICD-10-CM | POA: Diagnosis not present

## 2023-04-01 DIAGNOSIS — K219 Gastro-esophageal reflux disease without esophagitis: Secondary | ICD-10-CM | POA: Diagnosis not present

## 2023-04-01 DIAGNOSIS — E1142 Type 2 diabetes mellitus with diabetic polyneuropathy: Secondary | ICD-10-CM | POA: Diagnosis not present

## 2023-04-01 DIAGNOSIS — I10 Essential (primary) hypertension: Secondary | ICD-10-CM | POA: Diagnosis not present

## 2023-04-01 NOTE — Telephone Encounter (Signed)
Pt c/o medication issue:  1. Name of Medication:  metoprolol tartrate (LOPRESSOR) 25 MG tablet  2. How are you currently taking this medication (dosage and times per day)?   3. Are you having a reaction (difficulty breathing--STAT)?   4. What is your medication issue?   Patient's wife states patient normally takes 3 tablets in the morning and 3 at night. Last night patient only took 1 tablet. This morning he took the 3 that he normally takes and the additional 2 he skipped last night. Patient's wife would like to know what patient should do. She mentions that patient has a PCP appointment this morning at 9:30 AM.

## 2023-04-01 NOTE — Telephone Encounter (Signed)
Spoke to wife (DPR) She reports pt did not take but 1 of his 3 Metoprolol tablets (25 mg) last night, but this morning he took 5 tablets. Discussed w/ Otilio Saber, PA.  Advised to monitor BP & HRs if he becomes symptomatic.  Aware BP & HRs may drop lower than normal due to extra medication. Advised to monitor for symptoms, such as light headedness/dizziness, near syncope/syncope. Advised not to drive to PCP this morning, but still still attend visit for evaluation. Advised to call office back if needing to re discuss and/or becomes symptomatic. Wife agreeable to plan.  After speaking with EP PA - I left detailed message on phone advising to increase salt intake today which may help some.  Will forward this message to PCP for their FYI as pt is scheduled to see them at 9:30 this morning.

## 2023-04-01 NOTE — Telephone Encounter (Signed)
Pt's wife Johnny Bridge was calling back and is requesting a callback at [phone] today since pt was seen at his PCP appt this morning. She'd like to discuss further once called back. Please advise

## 2023-04-01 NOTE — Telephone Encounter (Signed)
Error

## 2023-04-01 NOTE — Telephone Encounter (Signed)
Spoke to wife. States that when pt saw PCP this morning BP was 90/60, HR below 45. Wife says she took it at home at lunchtime and BP was 115/69, HR 56. Informed this was good news and that numbers are improving as I said they would as the day progressed and medication starts to wear off. Asking  if pt should take his normal dose this evening. Advised to take BP & HR prior to. Advised if still low/their preference ok to hold tonight's PM dose and restart normal dosing in the morning. Wife appreciates return call and agreeable to plan.

## 2023-04-07 DIAGNOSIS — I129 Hypertensive chronic kidney disease with stage 1 through stage 4 chronic kidney disease, or unspecified chronic kidney disease: Secondary | ICD-10-CM | POA: Diagnosis not present

## 2023-04-07 DIAGNOSIS — R809 Proteinuria, unspecified: Secondary | ICD-10-CM | POA: Diagnosis not present

## 2023-04-07 DIAGNOSIS — R230 Cyanosis: Secondary | ICD-10-CM | POA: Diagnosis not present

## 2023-04-07 DIAGNOSIS — R82991 Hypocitraturia: Secondary | ICD-10-CM | POA: Diagnosis not present

## 2023-04-07 DIAGNOSIS — N189 Chronic kidney disease, unspecified: Secondary | ICD-10-CM | POA: Diagnosis not present

## 2023-04-07 DIAGNOSIS — E1129 Type 2 diabetes mellitus with other diabetic kidney complication: Secondary | ICD-10-CM | POA: Diagnosis not present

## 2023-04-07 DIAGNOSIS — Z6824 Body mass index (BMI) 24.0-24.9, adult: Secondary | ICD-10-CM | POA: Diagnosis not present

## 2023-04-07 DIAGNOSIS — E785 Hyperlipidemia, unspecified: Secondary | ICD-10-CM | POA: Diagnosis not present

## 2023-04-07 DIAGNOSIS — E1142 Type 2 diabetes mellitus with diabetic polyneuropathy: Secondary | ICD-10-CM | POA: Diagnosis not present

## 2023-04-07 DIAGNOSIS — H6123 Impacted cerumen, bilateral: Secondary | ICD-10-CM | POA: Diagnosis not present

## 2023-04-07 DIAGNOSIS — N200 Calculus of kidney: Secondary | ICD-10-CM | POA: Diagnosis not present

## 2023-04-07 DIAGNOSIS — L97919 Non-pressure chronic ulcer of unspecified part of right lower leg with unspecified severity: Secondary | ICD-10-CM | POA: Diagnosis not present

## 2023-04-07 DIAGNOSIS — I83019 Varicose veins of right lower extremity with ulcer of unspecified site: Secondary | ICD-10-CM | POA: Diagnosis not present

## 2023-04-07 DIAGNOSIS — I739 Peripheral vascular disease, unspecified: Secondary | ICD-10-CM | POA: Diagnosis not present

## 2023-04-07 DIAGNOSIS — I10 Essential (primary) hypertension: Secondary | ICD-10-CM | POA: Diagnosis not present

## 2023-04-07 DIAGNOSIS — E875 Hyperkalemia: Secondary | ICD-10-CM | POA: Diagnosis not present

## 2023-04-07 DIAGNOSIS — K219 Gastro-esophageal reflux disease without esophagitis: Secondary | ICD-10-CM | POA: Diagnosis not present

## 2023-04-07 DIAGNOSIS — E1122 Type 2 diabetes mellitus with diabetic chronic kidney disease: Secondary | ICD-10-CM | POA: Diagnosis not present

## 2023-04-12 DIAGNOSIS — E1129 Type 2 diabetes mellitus with other diabetic kidney complication: Secondary | ICD-10-CM | POA: Diagnosis not present

## 2023-04-12 DIAGNOSIS — E1122 Type 2 diabetes mellitus with diabetic chronic kidney disease: Secondary | ICD-10-CM | POA: Diagnosis not present

## 2023-04-12 DIAGNOSIS — E875 Hyperkalemia: Secondary | ICD-10-CM | POA: Diagnosis not present

## 2023-04-12 DIAGNOSIS — R809 Proteinuria, unspecified: Secondary | ICD-10-CM | POA: Diagnosis not present

## 2023-04-21 ENCOUNTER — Other Ambulatory Visit: Payer: Self-pay | Admitting: Internal Medicine

## 2023-05-16 DIAGNOSIS — E1122 Type 2 diabetes mellitus with diabetic chronic kidney disease: Secondary | ICD-10-CM | POA: Diagnosis not present

## 2023-05-16 DIAGNOSIS — N189 Chronic kidney disease, unspecified: Secondary | ICD-10-CM | POA: Diagnosis not present

## 2023-05-16 DIAGNOSIS — I129 Hypertensive chronic kidney disease with stage 1 through stage 4 chronic kidney disease, or unspecified chronic kidney disease: Secondary | ICD-10-CM | POA: Diagnosis not present

## 2023-05-16 DIAGNOSIS — E875 Hyperkalemia: Secondary | ICD-10-CM | POA: Diagnosis not present

## 2023-05-23 DIAGNOSIS — E1122 Type 2 diabetes mellitus with diabetic chronic kidney disease: Secondary | ICD-10-CM | POA: Diagnosis not present

## 2023-05-23 DIAGNOSIS — I129 Hypertensive chronic kidney disease with stage 1 through stage 4 chronic kidney disease, or unspecified chronic kidney disease: Secondary | ICD-10-CM | POA: Diagnosis not present

## 2023-05-23 DIAGNOSIS — E8722 Chronic metabolic acidosis: Secondary | ICD-10-CM | POA: Diagnosis not present

## 2023-05-26 ENCOUNTER — Other Ambulatory Visit: Payer: Self-pay | Admitting: Internal Medicine

## 2023-06-08 ENCOUNTER — Encounter: Payer: Self-pay | Admitting: Podiatry

## 2023-06-08 ENCOUNTER — Ambulatory Visit (INDEPENDENT_AMBULATORY_CARE_PROVIDER_SITE_OTHER): Payer: Medicare Other | Admitting: Podiatry

## 2023-06-08 VITALS — Ht 72.0 in | Wt 184.4 lb

## 2023-06-08 DIAGNOSIS — M79674 Pain in right toe(s): Secondary | ICD-10-CM | POA: Diagnosis not present

## 2023-06-08 DIAGNOSIS — I739 Peripheral vascular disease, unspecified: Secondary | ICD-10-CM | POA: Diagnosis not present

## 2023-06-08 DIAGNOSIS — M79675 Pain in left toe(s): Secondary | ICD-10-CM

## 2023-06-08 DIAGNOSIS — B351 Tinea unguium: Secondary | ICD-10-CM

## 2023-06-08 NOTE — Progress Notes (Signed)
  Subjective:  Patient ID: Marcus [name], male    DOB: 1945/09/19,  MRN: [id number]  Marcus [name] presents to clinic today for at risk foot care. Patient has history of NIDDM, neuropathy, PAD and GBS.  He is seen for painful elongated mycotic toenails 1-5 bilaterally which are tender when wearing enclosed shoe gear. Pain is relieved with periodic professional debridement.  He relates no new problems.He continues to be followed by Vascular Surgery for his PAD. Last visit with Dr.Dickson in January. At that visit he has injury to left great toe and this has completely resolved.  PCP is Donetta Potts, MD , and last visit was 09/28/2021.  Allergies  Allergen Reactions   Influenza Vaccines Other (See Comments)    Guillain-Barre syndrome    Review of Systems: Negative except as noted in the HPI. Objective:   Constitutional Catherine Sama is a pleasant 77 y.o. Caucasian male, WD, WN in NAD. AAO x 3.   Vascular CFT <3 seconds b/l LE. Palpable DP pulse(s) left lower extremity Palpable PT pulse(s) right lower extremity Diminished DP pulse(s) right lower extremity. Diminished PT pulse(s) left lower extremity. Pedal hair absent. No pain with calf compression b/l. Lower extremity skin temperature gradient warm to cool. Cyanosis present bilateral lower extremities. Trace edema noted BLE.  Neurologic Normal speech. Oriented to person, place, and time. Protective sensation diminished with 10g monofilament b/l. Vibratory sensation diminished b/l.  Dermatologic Healing abrasion left 5th digit with no ischemia. Pedal skin thin, shiny and atrophic b/l LE. No open wounds b/l LE. No interdigital macerations noted b/l LE. Toenails 1-5 b/l elongated, discolored, dystrophic, thickened, crumbly with subungual debris and tenderness to dorsal palpation. Hyperkeratotic lesion(s) submet head 5 left foot.  No erythema, no edema, no drainage, no fluctuance. Porokeratotic lesion(s) submet  head 5 right foot. No erythema, no edema, no drainage, no fluctuance.  Orthopedic: Normal muscle strength 5/5 to all lower extremity muscle groups bilaterally. HAV with bunion deformity noted b/l LE. Hammertoe deformity noted 2-5 b/l.Marland Kitchen No pain, crepitus or joint limitation noted with ROM b/l LE.  Patient ambulates independently without assistive aids.   Radiographs: None  Last A1c:      No data to display         Assessment:   1. Pain due to onychomycosis of toenails of both feet   2. PAD (peripheral artery disease) (HCC)     Plan:  Patient was evaluated and treated and all questions answered. Consent given for treatment as described below: Left hallux injury resolved. He has healing abrasion left 5th digit. He knows to call Dr.Dickson if he has any problems with this. -Mycotic toenails 1-5 bilaterally were debrided in length and girth with sterile nail nippers and dremel without incident. -Callus(es) submet head 5 left foot pared utilizing mandrel sander without complication or incident. Total number debrided =1. -Painful porokeratotic lesion(s) submet head 5 right foot pared and enucleated with sterile scalpel blade without incident. Total number of lesions debrided=1. -Patient to report any pedal injuries to medical professional immediately. -Patient/POA to call should there be question/concern in the interim.  No follow-ups on file.  Candelaria Stagers, DPM

## 2023-07-28 DIAGNOSIS — R809 Proteinuria, unspecified: Secondary | ICD-10-CM | POA: Diagnosis not present

## 2023-07-28 DIAGNOSIS — E211 Secondary hyperparathyroidism, not elsewhere classified: Secondary | ICD-10-CM | POA: Diagnosis not present

## 2023-07-28 DIAGNOSIS — N189 Chronic kidney disease, unspecified: Secondary | ICD-10-CM | POA: Diagnosis not present

## 2023-07-28 DIAGNOSIS — D631 Anemia in chronic kidney disease: Secondary | ICD-10-CM | POA: Diagnosis not present

## 2023-08-05 DIAGNOSIS — E1122 Type 2 diabetes mellitus with diabetic chronic kidney disease: Secondary | ICD-10-CM | POA: Diagnosis not present

## 2023-08-05 DIAGNOSIS — I129 Hypertensive chronic kidney disease with stage 1 through stage 4 chronic kidney disease, or unspecified chronic kidney disease: Secondary | ICD-10-CM | POA: Diagnosis not present

## 2023-08-05 DIAGNOSIS — R82991 Hypocitraturia: Secondary | ICD-10-CM | POA: Diagnosis not present

## 2023-08-05 DIAGNOSIS — N200 Calculus of kidney: Secondary | ICD-10-CM | POA: Diagnosis not present

## 2023-08-19 ENCOUNTER — Ambulatory Visit (INDEPENDENT_AMBULATORY_CARE_PROVIDER_SITE_OTHER): Payer: No Typology Code available for payment source | Admitting: Podiatry

## 2023-08-19 ENCOUNTER — Encounter: Payer: Self-pay | Admitting: Podiatry

## 2023-08-19 VITALS — Ht 72.0 in | Wt 184.0 lb

## 2023-08-19 DIAGNOSIS — S90111A Contusion of right great toe without damage to nail, initial encounter: Secondary | ICD-10-CM | POA: Diagnosis not present

## 2023-08-19 DIAGNOSIS — E1142 Type 2 diabetes mellitus with diabetic polyneuropathy: Secondary | ICD-10-CM | POA: Diagnosis not present

## 2023-08-19 NOTE — Progress Notes (Unsigned)
Subjective:  Patient ID: Marcus [name], male    DOB: 1946/05/16,  MRN: [id number]  Chief Complaint  Patient presents with   Nail Problem    "The wife reports that she has noticed some discoloration and possible ulcer or infection in the big toes on both feet"    78 y.o. male presents with the above complaint.  Patient presents with right hallux mild contusion.  Patient states he has been present for quite some time the toe was little bluish but it seems to be doing a lot better now.  He just wants to make sure that there is no ulceration or infection.  He has not seen anyone as prior to seeing me denies any other acute complaints pain scale 7 out of 10 dull achy in nature.   Review of Systems: Negative except as noted in the HPI. Denies N/V/F/Ch.  Past Medical History:  Diagnosis Date   AF (atrial fibrillation) (HCC)    Atrial flutter (HCC)    Blue toes    chronic   Essential hypertension, benign 04/23/2019   GERD (gastroesophageal reflux disease)    Gout    Guillain Barr syndrome (HCC)    Nuclear sclerotic cataract of left eye 05/07/2020   Nuclear sclerotic cataract of right eye 05/07/2020   Posterior subcapsular polar age-related cataract of left eye 05/07/2020   Type II diabetes mellitus, uncontrolled 04/23/2019    Current Outpatient Medications:    albuterol (VENTOLIN HFA) 108 (90 Base) MCG/ACT inhaler, Inhale 2 puffs into the lungs every 6 (six) hours as needed for wheezing or shortness of breath., Disp: 8 g, Rfl: 2   aspirin EC 81 MG tablet, Take 1 tablet (81 mg total) by mouth daily., Disp: 90 tablet, Rfl: 3   ciclopirox (PENLAC) 8 % solution, Apply topically at bedtime. Apply over nail and surrounding skin. Apply daily over previous coat. After seven (7) days, may remove with alcohol and continue cycle., Disp: 6.6 mL, Rfl: 0   digoxin (LANOXIN) 0.125 MG tablet, Take 1 tablet (0.125 mg total) by mouth daily., Disp: 90 tablet, Rfl: 2   glipiZIDE (GLUCOTROL) 5 MG  tablet, Take 5 mg by mouth daily before breakfast., Disp: , Rfl:    glucose blood test strip, Use to check your blood glucose once a day., Disp: 100 each, Rfl: 3   pantoprazole (PROTONIX) 40 MG tablet, TAKE 1 TABLET ONCE DAILY., Disp: 90 tablet, Rfl: 0   pravastatin (PRAVACHOL) 20 MG tablet, Take 1 tablet (20 mg total) by mouth daily., Disp: 90 tablet, Rfl: 0   telmisartan (MICARDIS) 20 MG tablet, Take by mouth., Disp: , Rfl:    vitamin B-12 (CYANOCOBALAMIN) 500 MCG tablet, Take 1 tablet (500 mcg total) by mouth daily., Disp: 30 tablet, Rfl: 1   metFORMIN (GLUCOPHAGE-XR) 500 MG 24 hr tablet, TAKE (1) TABLET TWICE DAILY., Disp: 60 tablet, Rfl: 1  Social History   Tobacco Use  Smoking Status Former   Current packs/day: 0.00   Average packs/day: 2.0 packs/day for 5.0 years (10.0 ttl pk-yrs)   Types: Cigarettes   Start date: 07/26/1958   Quit date: 07/27/1963   Years since quitting: 60.1  Smokeless Tobacco Never    Allergies  Allergen Reactions   Influenza Vaccines Other (See Comments)    Guillain-Barre syndrome   Objective:  There were no vitals filed for this visit. Body mass index is 24.95 kg/m. Constitutional Well developed. Well nourished.  Vascular Dorsalis pedis pulses palpable bilaterally. Posterior tibial pulses palpable bilaterally. Capillary  refill normal to all digits.  No cyanosis or clubbing noted. Pedal hair growth normal.  Neurologic Normal speech. Oriented to person, place, and time. Epicritic sensation to light touch grossly present bilaterally.  Dermatologic Nails well groomed and normal in appearance. No open wounds. No skin lesions.  Orthopedic: Right hallux mild contusion no damage to the nail or underlying nailbed or nail is well adhered to the underlying nail.  No signs of infection noted no open wounds or lesion noted   Radiographs: None Assessment:   1. Contusion of right great toe without damage to nail, initial encounter   2. Diabetic peripheral  neuropathy associated with type 2 diabetes mellitus (HCC)    Plan:  Patient was evaluated and treated and all questions answered.  Right hallux toe contusion without damage -All questions and concerns were discussed with the patient extensive detail given the amount of contusion that is present I believe patient will benefit from clinically monitoring.  At this time there is no concern for nail damage or loosening of the nail I encouraged him that if it happens or if he starts developing ulceration to come see me right away he states understanding.  No follow-ups on file.

## 2023-09-07 DIAGNOSIS — L723 Sebaceous cyst: Secondary | ICD-10-CM | POA: Diagnosis not present

## 2023-09-09 ENCOUNTER — Ambulatory Visit: Payer: Medicare Other | Admitting: Podiatry

## 2024-03-06 DIAGNOSIS — L821 Other seborrheic keratosis: Secondary | ICD-10-CM | POA: Diagnosis not present

## 2024-03-06 DIAGNOSIS — L570 Actinic keratosis: Secondary | ICD-10-CM | POA: Diagnosis not present
